# Patient Record
Sex: Female | Born: 1955
Health system: Southern US, Community
[De-identification: ages and names within clinical notes are randomized; demographics above are authoritative.]

## PROBLEM LIST (undated history)

## (undated) DIAGNOSIS — I10 Essential (primary) hypertension: Secondary | ICD-10-CM

## (undated) DIAGNOSIS — L900 Lichen sclerosus et atrophicus: Secondary | ICD-10-CM

## (undated) DIAGNOSIS — R87619 Unspecified abnormal cytological findings in specimens from cervix uteri: Secondary | ICD-10-CM

## (undated) DIAGNOSIS — F32A Depression, unspecified: Secondary | ICD-10-CM

## (undated) DIAGNOSIS — F419 Anxiety disorder, unspecified: Secondary | ICD-10-CM

## (undated) DIAGNOSIS — Z860100 Personal history of colon polyps, unspecified: Secondary | ICD-10-CM

## (undated) DIAGNOSIS — E119 Type 2 diabetes mellitus without complications: Secondary | ICD-10-CM

## (undated) DIAGNOSIS — E785 Hyperlipidemia, unspecified: Secondary | ICD-10-CM

## (undated) DIAGNOSIS — E079 Disorder of thyroid, unspecified: Secondary | ICD-10-CM

## (undated) DIAGNOSIS — IMO0002 Reserved for concepts with insufficient information to code with codable children: Secondary | ICD-10-CM

## (undated) DIAGNOSIS — F329 Major depressive disorder, single episode, unspecified: Secondary | ICD-10-CM

## (undated) HISTORY — DX: Lichen sclerosus et atrophicus: L90.0

## (undated) HISTORY — DX: Essential (primary) hypertension: I10

## (undated) HISTORY — DX: Depression, unspecified: F32.A

## (undated) HISTORY — DX: Unspecified abnormal cytological findings in specimens from cervix uteri: R87.619

## (undated) HISTORY — DX: Personal history of colon polyps, unspecified: Z86.0100

## (undated) HISTORY — DX: Personal history of colonic polyps: Z86.010

## (undated) HISTORY — DX: Type 2 diabetes mellitus without complications: E11.9

## (undated) HISTORY — PX: COLPOSCOPY: SHX161

## (undated) HISTORY — DX: Anxiety disorder, unspecified: F41.9

## (undated) HISTORY — PX: LYMPH NODE BIOPSY: SHX201

## (undated) HISTORY — DX: Hyperlipidemia, unspecified: E78.5

## (undated) HISTORY — DX: Major depressive disorder, single episode, unspecified: F32.9

## (undated) HISTORY — DX: Reserved for concepts with insufficient information to code with codable children: IMO0002

## (undated) HISTORY — DX: Disorder of thyroid, unspecified: E07.9

---

## 1988-02-21 HISTORY — PX: TUBAL LIGATION: SHX77

## 2008-02-21 DIAGNOSIS — R87619 Unspecified abnormal cytological findings in specimens from cervix uteri: Secondary | ICD-10-CM

## 2008-02-21 HISTORY — DX: Unspecified abnormal cytological findings in specimens from cervix uteri: R87.619

## 2011-10-10 ENCOUNTER — Other Ambulatory Visit (HOSPITAL_COMMUNITY)
Admission: RE | Admit: 2011-10-10 | Discharge: 2011-10-10 | Disposition: A | Discharge status: Home / Self Care | Payer: 59 | Source: Ambulatory Visit | Attending: Family Medicine | Admitting: Family Medicine

## 2011-10-10 DIAGNOSIS — Z01419 Encounter for gynecological examination (general) (routine) without abnormal findings: Secondary | ICD-10-CM | POA: Insufficient documentation

## 2011-10-10 DIAGNOSIS — Z1151 Encounter for screening for human papillomavirus (HPV): Secondary | ICD-10-CM | POA: Insufficient documentation

## 2012-10-29 ENCOUNTER — Ambulatory Visit
Admission: RE | Admit: 2012-10-29 | Discharge: 2012-10-29 | Disposition: A | Discharge status: Home / Self Care | Payer: 59 | Source: Ambulatory Visit | Attending: Orthopedic Surgery | Admitting: Orthopedic Surgery

## 2012-10-29 ENCOUNTER — Other Ambulatory Visit: Payer: Self-pay | Admitting: Orthopedic Surgery

## 2012-10-29 DIAGNOSIS — M25611 Stiffness of right shoulder, not elsewhere classified: Secondary | ICD-10-CM

## 2012-10-29 DIAGNOSIS — M25511 Pain in right shoulder: Secondary | ICD-10-CM

## 2012-11-12 ENCOUNTER — Other Ambulatory Visit: Payer: Self-pay | Admitting: Orthopedic Surgery

## 2012-11-12 DIAGNOSIS — M25511 Pain in right shoulder: Secondary | ICD-10-CM

## 2012-11-23 ENCOUNTER — Other Ambulatory Visit: Payer: 59

## 2012-11-25 ENCOUNTER — Ambulatory Visit: Payer: 59

## 2012-11-25 ENCOUNTER — Ambulatory Visit (INDEPENDENT_AMBULATORY_CARE_PROVIDER_SITE_OTHER): Payer: 59 | Admitting: Family Medicine

## 2012-11-25 VITALS — BP 150/82 | HR 83 | Temp 98.0°F | Resp 16 | Ht 63.0 in | Wt 145.0 lb

## 2012-11-25 DIAGNOSIS — M25512 Pain in left shoulder: Secondary | ICD-10-CM

## 2012-11-25 DIAGNOSIS — M25519 Pain in unspecified shoulder: Secondary | ICD-10-CM

## 2012-11-25 MED ORDER — METHOCARBAMOL 500 MG PO TABS: 500.0000 mg | ORAL_TABLET | Freq: Three times a day (TID) | ORAL | Status: DC

## 2012-11-25 NOTE — Patient Instructions (Signed)
Use the robaxin as directed.  Be cautions of sedation with this medication  Please call your orthopedist about your MRI and see about having this rescheduled.    Avoid lifting anything overhead.    I will get you referred to see PT.

## 2012-11-25 NOTE — Progress Notes (Signed)
[facility] [address], [place] [address] [phone]  Date:  11/25/2012   Name:  Nancy Chandler   DOB:  06-Oct-1955   MRN:  [id number]  PCP:  Geraldo Pitter, MD    Chief Complaint: Shoulder Pain   History of Present Illness:  Nancy Chandler is a 57 y.o. very pleasant female patient who presents with the following:  She has noted pain in her left shoulder, down her left arm and into her left shoulder blade.  "It's just throbbing and has been for a couple of days."  Today is Monday and she has had pain since last Wednesday.   She also has noted pain in her right shoulder for a couple of months.  She has seen ortho (?whom) and been dx with possible torn rotator cuff.  She was supposed to have an MRI this past Friday.  However, she never did end up having the MRI for insurance reasons.  She has been protecting the right sholder by and this may have caused her to overuse her left shoulder.    She notes pain at night especially.  "I can't get comfortable.  This pain is making me crazy."  She is otherwise generally healthy.  History of mild HTN.    No personal history of CAD.  Her grandmother did have CHF, but otherwise no family history of cardiac disease.  She has never had any sort of cardiac evaluation, no history of exertional pain.  Never a smoker There are no active problems to display for this patient.   Past Medical History  Diagnosis Date  . Hypertension     History reviewed. No pertinent past surgical history.  History  Substance Use Topics  . Smoking status: Never Smoker   . Smokeless tobacco: Not on file  . Alcohol Use: No    Family History  Problem Relation Age of Onset  . Hypertension Brother     No Known Allergies  Medication list has been reviewed and updated.  No current outpatient prescriptions on file prior to visit.   No current facility-administered medications on file prior to visit.    Review of Systems:  As per HPI-  otherwise negative.   Physical Examination: Filed Vitals:   11/25/12 0810  BP: 152/72  Pulse: 83  Temp: 98 F (36.7 C)  Resp: 16   Filed Vitals:   11/25/12 0810  Height: 5\' 3"  (1.6 m)  Weight: 145 lb (65.772 kg)   Body mass index is 25.69 kg/(m^2). Ideal Body Weight: Weight in (lb) to have BMI = 25: 140.8  GEN: WDWN, NAD, Non-toxic, A & O x 3, looks well HEENT: Atraumatic, Normocephalic. Neck supple. No masses, No LAD.  Bilateral TM wnl, oropharynx normal.  PEERL,EOMI.   Ears and Nose: No external deformity. CV: RRR, No M/G/R. No JVD. No thrill. No extra heart sounds. PULM: CTA B, no wheezes, crackles, rhonchi. No retractions. No resp. distress. No accessory muscle use. EXTR: No c/c/e NEURO Normal gait.  PSYCH: Normally interactive. Conversant. Not depressed or anxious appearing.  Calm demeanor.  Right shoulder: very tender over anterior RC insertion.  Not able to abduct past 90.  Pain with internal and external rotation.   Left shoulder; tender over anterior RC insertion.  Mild pain with internal rotation.  Negative Hawkins and neer's.  Full ROM in all directions.  No weakness but she does have pain with Empty Cantest.    EKG:  NSR, no concerning ST elevation or  depression.   [facility] reading (PRIMARY) by  Dr. Patsy Lager. Left shoulder:  Normal.    LEFT SHOULDER - 2+ VIEW  COMPARISON: No priors.  FINDINGS: Multiple views of the left shoulder demonstrate no acute displaced fracture, subluxation, dislocation, or soft tissue abnormality.  IMPRESSION: 1. No acute radiographic abnormality of the left shoulder.   Assessment and Plan: Pain in left shoulder - Plan: DG Shoulder Left, EKG 12-Lead, methocarbamol (ROBAXIN) 500 MG tablet, Ambulatory referral to Physical Therapy  Dula seems to have RC tendonitis vs partial tear in the right shoulder, and less severe RC tendonitis in the left shoulder.  Her EKG and exam are reassuring.  Gave a sling to use only to rest her arm- warned  her not to wear this excessively as stiffness can result.  She is going to call her orthopedist and find out about getting her MRI rescheduled.   PT referral   Signed Abbe Amsterdam, MD

## 2012-11-27 ENCOUNTER — Telehealth: Payer: Self-pay

## 2012-11-27 NOTE — Telephone Encounter (Signed)
PT STATES SHE NEED TO COME BY AND P/U A COPY OF HER XRAYS TODAY, HAVE AN APPT PLEASE CALL [phone]

## 2012-11-27 NOTE — Telephone Encounter (Signed)
Have given request to xray

## 2012-11-28 ENCOUNTER — Ambulatory Visit: Payer: 59 | Attending: Orthopedic Surgery | Admitting: Physical Therapy

## 2012-11-28 DIAGNOSIS — M25619 Stiffness of unspecified shoulder, not elsewhere classified: Secondary | ICD-10-CM | POA: Insufficient documentation

## 2012-11-28 DIAGNOSIS — M25519 Pain in unspecified shoulder: Secondary | ICD-10-CM | POA: Insufficient documentation

## 2012-11-28 DIAGNOSIS — IMO0001 Reserved for inherently not codable concepts without codable children: Secondary | ICD-10-CM | POA: Insufficient documentation

## 2012-11-29 ENCOUNTER — Ambulatory Visit
Admission: RE | Admit: 2012-11-29 | Discharge: 2012-11-29 | Disposition: A | Discharge status: Home / Self Care | Payer: 59 | Source: Ambulatory Visit | Attending: Orthopedic Surgery | Admitting: Orthopedic Surgery

## 2012-11-29 ENCOUNTER — Other Ambulatory Visit: Payer: Self-pay | Admitting: Orthopedic Surgery

## 2012-11-29 DIAGNOSIS — M5412 Radiculopathy, cervical region: Secondary | ICD-10-CM

## 2012-12-04 ENCOUNTER — Ambulatory Visit: Payer: 59 | Admitting: Physical Therapy

## 2012-12-11 ENCOUNTER — Ambulatory Visit: Payer: 59 | Admitting: Physical Therapy

## 2012-12-18 ENCOUNTER — Ambulatory Visit: Payer: 59 | Admitting: Physical Therapy

## 2013-01-04 ENCOUNTER — Other Ambulatory Visit: Payer: 59

## 2013-01-27 ENCOUNTER — Ambulatory Visit (INDEPENDENT_AMBULATORY_CARE_PROVIDER_SITE_OTHER): Payer: 59 | Admitting: Family Medicine

## 2013-01-27 VITALS — BP 126/72 | HR 75 | Temp 97.8°F | Resp 18 | Ht 63.0 in | Wt 138.0 lb

## 2013-01-27 DIAGNOSIS — R109 Unspecified abdominal pain: Secondary | ICD-10-CM

## 2013-01-27 DIAGNOSIS — IMO0002 Reserved for concepts with insufficient information to code with codable children: Secondary | ICD-10-CM

## 2013-01-27 DIAGNOSIS — S46911S Strain of unspecified muscle, fascia and tendon at shoulder and upper arm level, right arm, sequela: Secondary | ICD-10-CM

## 2013-01-27 LAB — POCT CBC
Granulocyte percent: 56.4 %G (ref 37–80)
HCT, POC: 44.2 % (ref 37.7–47.9)
Hemoglobin: 13.5 g/dL (ref 12.2–16.2)
Lymph, poc: 2.4 (ref 0.6–3.4)
MCH, POC: 26.4 pg — AB (ref 27–31.2)
MCHC: 30.5 g/dL — AB (ref 31.8–35.4)
MCV: 86.4 fL (ref 80–97)
MID (cbc): 0.4 (ref 0–0.9)
MPV: 9.8 fL (ref 0–99.8)
POC Granulocyte: 3.6 (ref 2–6.9)
POC LYMPH PERCENT: 37.7 %L (ref 10–50)
POC MID %: 5.9 %M (ref 0–12)
Platelet Count, POC: 202 10*3/uL (ref 142–424)
RBC: 5.12 M/uL (ref 4.04–5.48)
RDW, POC: 14.6 %
WBC: 6.4 10*3/uL (ref 4.6–10.2)

## 2013-01-27 LAB — COMPREHENSIVE METABOLIC PANEL
ALT: 14 U/L (ref 0–35)
AST: 16 U/L (ref 0–37)
Albumin: 3.9 g/dL (ref 3.5–5.2)
Alkaline Phosphatase: 71 U/L (ref 39–117)
BUN: 13 mg/dL (ref 6–23)
CO2: 30 mEq/L (ref 19–32)
Calcium: 9.8 mg/dL (ref 8.4–10.5)
Chloride: 104 mEq/L (ref 96–112)
Creat: 0.8 mg/dL (ref 0.50–1.10)
Glucose, Bld: 89 mg/dL (ref 70–99)
Potassium: 3.7 mEq/L (ref 3.5–5.3)
Sodium: 142 mEq/L (ref 135–145)
Total Bilirubin: 0.4 mg/dL (ref 0.3–1.2)
Total Protein: 7.2 g/dL (ref 6.0–8.3)

## 2013-01-27 NOTE — Progress Notes (Signed)
[facility] [address], [place] [address] [phone]  Date:  01/27/2013   Name:  Nancy Chandler   DOB:  10/05/1955   MRN:  [id number]  PCP:  Geraldo Pitter, MD    Chief Complaint: pulled muscle in stomach   History of Present Illness:  Nancy Chandler is a 57 y.o. very pleasant female patient who presents with the following:  Last Thursday (today is Monday) she noted a pain in her stomach that felt like a pulled muscle.  She noted it over the weekend and again today, and called her company nurse who suggested that she be seen.   She first noted it when she woke up last week.  She was also a bit constipated at that time but this is now resolved.   She has not vomited but has felt a little nauseated.  She is eating ok.   No fever, no other symptoms of illness.   She does note that she picked up a large pile of laundry prior to onset of the pain; however she did not have any sudden pain.  No other known injury or fall.  No urinary sx.   She is s/p BTL and is menopausal.    She ate breakfast at 7am, and last drank water about 2 hours ago.    [facility] however it seemed they were having trouble doing the prior auth for her shoulder MRI so she was never able to have this done.  She would like to have a referral for another orthopedist as she is still bothered by shoulder pain and would like to have an MRI at some point.   She has some robaxin at home that she can use for her stomach pain.    There are no active problems to display for this patient.   Past Medical History  Diagnosis Date  . Hypertension     History reviewed. No pertinent past surgical history.  History  Substance Use Topics  . Smoking status: Never Smoker   . Smokeless tobacco: Not on file  . Alcohol Use: No    Family History  Problem Relation Age of Onset  . Hypertension Brother     No Known Allergies  Medication list has  been reviewed and updated.  Current Outpatient Prescriptions on File Prior to Visit  Medication Sig Dispense Refill  . hydrochlorothiazide (MICROZIDE) 12.5 MG capsule Take 12.5 mg by mouth daily.      . potassium chloride (K-DUR,KLOR-CON) 10 MEQ tablet Take 10 mEq by mouth 2 (two) times daily.      . methocarbamol (ROBAXIN) 500 MG tablet Take 1 tablet (500 mg total) by mouth 3 (three) times daily.  30 tablet  0  . naproxen (NAPROSYN) 500 MG tablet Take 500 mg by mouth 2 (two) times daily with a meal.       No current facility-administered medications on file prior to visit.    Review of Systems:  As per HPI- otherwise negative.   Physical Examination: Filed Vitals:   01/27/13 1346  BP: 126/72  Pulse: 75  Temp: 97.8 F (36.6 C)  Resp: 18   Filed Vitals:   01/27/13 1346  Height: 5\' 3"  (1.6 m)  Weight: 138 lb (62.596 kg)   Body mass index is 24.45 kg/(m^2). Ideal Body Weight: Weight in (lb) to have BMI = 25: 140.8  GEN: WDWN, NAD, Non-toxic, A & O x 3, looks  well HEENT: Atraumatic, Normocephalic. Neck supple. No masses, No LAD.  Bilateral TM wnl, oropharynx normal.  PEERL,EOMI.   Ears and Nose: No external deformity. CV: RRR, No M/G/R. No JVD. No thrill. No extra heart sounds. PULM: CTA B, no wheezes, crackles, rhonchi. No retractions. No resp. distress. No accessory muscle use. ABD: S, ND, +BS. No rebound. No HSM.  She indicates an area of tenderness on the right side of her umbilicus.  No definite muscular deficit felt.  Possibly enlarged liver.  Negative murphys, no RLQ tenderness  EXTR: No c/c/e NEURO Normal gait.  PSYCH: Normally interactive. Conversant. Not depressed or anxious appearing.  Calm demeanor.   Results for orders placed in visit on 01/27/13  POCT CBC      Result Value Range   WBC 6.4  4.6 - 10.2 K/uL   Lymph, poc 2.4  0.6 - 3.4   POC LYMPH PERCENT 37.7  10 - 50 %L   MID (cbc) 0.4  0 - 0.9   POC MID % 5.9  0 - 12 %M   POC Granulocyte 3.6  2 - 6.9    Granulocyte percent 56.4  37 - 80 %G   RBC 5.12  4.04 - 5.48 M/uL   Hemoglobin 13.5  12.2 - 16.2 g/dL   HCT, POC 96.0  45.4 - 47.9 %   MCV 86.4  80 - 97 fL   MCH, POC 26.4 (*) 27 - 31.2 pg   MCHC 30.5 (*) 31.8 - 35.4 g/dL   RDW, POC 09.8     Platelet Count, POC 202  142 - 424 K/uL   MPV 9.8  0 - 99.8 fL    Assessment and Plan: Abdominal  pain, other specified site - Plan: POCT CBC, Comprehensive metabolic panel, US Abdomen Complete  Right shoulder strain, sequela - Plan: Ambulatory referral to Orthopedic Surgery  Unable to get abd [place] today as she had water earlier today, made appt for tomorrow. Await CMP.  CBC is reassuring that she does not have any acute infective process.   Await [place] results tomorrow.   Referral to another ortho office for care of her shoulder.    Signed Abbe Amsterdam, MD

## 2013-01-27 NOTE — Patient Instructions (Signed)
Please go to [facility] at [employer] tomorrow by 9:15 am to have your ultrasound.  NOTHING to eat or drink after midnight tonight.  I will let you know what you report shows once it comes in.    Try some of the robaxin for your abdominal pains.   If you get much worse- fever, vomiting, or more severe pain in the meantime- go to the ER or call me

## 2013-01-28 ENCOUNTER — Ambulatory Visit
Admission: RE | Admit: 2013-01-28 | Discharge: 2013-01-28 | Disposition: A | Discharge status: Home / Self Care | Payer: 59 | Source: Ambulatory Visit | Attending: Family Medicine | Admitting: Family Medicine

## 2013-01-28 ENCOUNTER — Telehealth: Payer: Self-pay

## 2013-01-28 DIAGNOSIS — R109 Unspecified abdominal pain: Secondary | ICD-10-CM

## 2013-01-28 NOTE — Telephone Encounter (Signed)
Patient is calling to find out test results.  [phone]

## 2013-01-28 NOTE — Telephone Encounter (Signed)
Called her back- good news, her ultrasound and CMP are normal.  She is feeling a bit better, notes the pain less today. Asked her to keep me posted- if pain persists or gets worse we will proceed to a CT scan.  She agreed with plan.

## 2013-02-03 ENCOUNTER — Telehealth: Payer: Self-pay

## 2013-02-03 DIAGNOSIS — R109 Unspecified abdominal pain: Secondary | ICD-10-CM

## 2013-02-03 NOTE — Telephone Encounter (Signed)
PT WANTED DR COPLAND TO KNOW SHE IS STILL HAVING THE SAME PROBLEMS AS BEFORE. PLEASE CALL [phone]

## 2013-02-03 NOTE — Telephone Encounter (Signed)
Dr Patsy Lager has advised if pain persists proceed with CT scan. Advised her order put in for this.  Patient states she has also had darker stools recently, she is taking pepto bismol. Advised often this is the cause, if it continues, she will come in. She indicated abdominal pain not worsening, just not improving at all. She agrees to come in immediately if pain gets worse.

## 2013-02-03 NOTE — Telephone Encounter (Signed)
Called her back- she is not any worse but her sx persist.  No fever, no vomiting, she is still eating ok.  She will come and see me tomorrow and we will plan for a CT scan tomorrow afternoon

## 2013-02-04 ENCOUNTER — Ambulatory Visit: Payer: 59

## 2013-02-04 ENCOUNTER — Encounter: Payer: Self-pay | Admitting: Family Medicine

## 2013-02-04 ENCOUNTER — Ambulatory Visit
Admission: RE | Admit: 2013-02-04 | Discharge: 2013-02-04 | Disposition: A | Discharge status: Home / Self Care | Payer: 59 | Source: Ambulatory Visit | Attending: Family Medicine | Admitting: Family Medicine

## 2013-02-04 ENCOUNTER — Ambulatory Visit (INDEPENDENT_AMBULATORY_CARE_PROVIDER_SITE_OTHER): Payer: 59 | Admitting: Family Medicine

## 2013-02-04 VITALS — BP 138/80 | HR 72 | Temp 97.9°F | Resp 16 | Ht 63.25 in | Wt 139.2 lb

## 2013-02-04 DIAGNOSIS — R109 Unspecified abdominal pain: Secondary | ICD-10-CM

## 2013-02-04 DIAGNOSIS — R3129 Other microscopic hematuria: Secondary | ICD-10-CM

## 2013-02-04 LAB — POCT URINALYSIS DIPSTICK
Bilirubin, UA: NEGATIVE
Glucose, UA: NEGATIVE
Ketones, UA: NEGATIVE
Leukocytes, UA: NEGATIVE
Nitrite, UA: NEGATIVE
Protein, UA: NEGATIVE
Spec Grav, UA: <=1.005
Urobilinogen, UA: 0.2
pH, UA: 6

## 2013-02-04 LAB — POCT UA - MICROSCOPIC ONLY
Bacteria, U Microscopic: NEGATIVE
Casts, Ur, LPF, POC: NEGATIVE
Crystals, Ur, HPF, POC: NEGATIVE
Epithelial cells, urine per micros: NEGATIVE
Mucus, UA: NEGATIVE
WBC, Ur, HPF, POC: NEGATIVE
Yeast, UA: NEGATIVE

## 2013-02-04 LAB — POCT URINE PREGNANCY: Preg Test, Ur: NEGATIVE

## 2013-02-04 LAB — IFOBT (OCCULT BLOOD): IFOBT: NEGATIVE

## 2013-02-04 MED ORDER — IOHEXOL 300 MG/ML  SOLN
30.0000 mL | Freq: Once | INTRAMUSCULAR | Status: AC | PRN
  Administered 2013-02-04: 30 mL via ORAL

## 2013-02-04 MED ORDER — IOHEXOL 300 MG/ML  SOLN
100.0000 mL | Freq: Once | INTRAMUSCULAR | Status: AC | PRN
  Administered 2013-02-04: 100 mL via INTRAVENOUS

## 2013-02-04 NOTE — Progress Notes (Addendum)
[facility] [address] [place] [address] [phone]  Date:  02/04/2013   Name:  Nancy Chandler   DOB:  Oct 14, 1955   MRN:  [id number]  PCP:  Geraldo Pitter, MD    Chief Complaint: Follow-up   History of Present Illness:  Nancy Chandler is a 57 y.o. very pleasant female patient who presents with the following:  She was seen here on 12/8 with pain to the right of her umbilicus for about 5 days. An ultrasound was unremarkable.  Since her last visit her pain has come and gone. She continues to hurt just to the right of her belly button.  She still has no urinary sx.   However, she did notice a couple of dark appearing stools.  She had taken some pepto a couple of times and was not sure if this could be the cause Her last colonosocpy was about 1 year ago.  Her mother died of colon cancer so this makes her nervous.    She has coughed a little over the last couple of days.   No fever.   She is eating normaly  No dramatic weight changes.  Overall she feels ok  Wt Readings from Last 3 Encounters:  02/04/13 139 lb 3.2 oz (63.141 kg)  01/27/13 138 lb (62.596 kg)  11/25/12 145 lb (65.772 kg)    There are no active problems to display for this patient.   Past Medical History  Diagnosis Date  . Hypertension     No past surgical history on file.  History  Substance Use Topics  . Smoking status: Never Smoker   . Smokeless tobacco: Not on file  . Alcohol Use: No    Family History  Problem Relation Age of Onset  . Hypertension Brother     Allergies  Allergen Reactions  . Hydrocodone Nausea And Vomiting    Medication list has been reviewed and updated.  Current Outpatient Prescriptions on File Prior to Visit  Medication Sig Dispense Refill  . ATORVASTATIN CALCIUM PO Take by mouth.      . hydrochlorothiazide (MICROZIDE) 12.5 MG capsule Take 12.5 mg by mouth daily.      . potassium chloride (K-DUR,KLOR-CON) 10 MEQ tablet Take 10 mEq by mouth 2 (two)  times daily.      . Vitamin D, Cholecalciferol, 1000 UNITS TABS Take by mouth.      . methocarbamol (ROBAXIN) 500 MG tablet Take 1 tablet (500 mg total) by mouth 3 (three) times daily.  30 tablet  0  . naproxen (NAPROSYN) 500 MG tablet Take 500 mg by mouth 2 (two) times daily with a meal.       No current facility-administered medications on file prior to visit.    Review of Systems:  As per HPI- otherwise negative.   Physical Examination: Filed Vitals:   02/04/13 1249  BP: 138/80  Pulse: 72  Temp: 97.9 F (36.6 C)  Resp: 16   Filed Vitals:   02/04/13 1249  Height: 5' 3.25" (1.607 m)  Weight: 139 lb 3.2 oz (63.141 kg)   Body mass index is 24.45 kg/(m^2). Ideal Body Weight: Weight in (lb) to have BMI = 25: 142  GEN: WDWN, NAD, Non-toxic, A & O x 3 HEENT: Atraumatic, Normocephalic. Neck supple. No masses, No LAD. Ears and Nose: No external deformity. CV: RRR, No M/G/R. No JVD. No thrill. No extra heart sounds. PULM: CTA B, no wheezes, crackles, rhonchi. No retractions. No resp. distress. No accessory  muscle use. ABD: S,  ND, +BS. No rebound. No HSM.  Continues to have minimal abdominal tenderness just right and superior to the umbilicus.  EXTR: No c/c/e NEURO Normal gait.  PSYCH: Normally interactive. Conversant. Not depressed or anxious appearing.  Calm demeanor.  Rectal: wnl, no BRB or melena evident  Results for orders placed in visit on 02/04/13  POCT URINE PREGNANCY      Result Value Range   Preg Test, Ur Negative    IFOBT (OCCULT BLOOD)      Result Value Range   IFOBT Negative    POCT UA - MICROSCOPIC ONLY      Result Value Range   WBC, Ur, HPF, POC neg     RBC, urine, microscopic 0-3     Bacteria, U Microscopic neg     Mucus, UA neg     Epithelial cells, urine per micros neg     Crystals, Ur, HPF, POC neg     Casts, Ur, LPF, POC neg     Yeast, UA neg    POCT URINALYSIS DIPSTICK      Result Value Range   Color, UA lt yellow     Clarity, UA clear      Glucose, UA neg     Bilirubin, UA neg     Ketones, UA neg     Spec Grav, UA <=1.005     Blood, UA trace-lysed     pH, UA 6.0     Protein, UA neg     Urobilinogen, UA 0.2     Nitrite, UA neg     Leukocytes, UA Negative       [facility] reading (PRIMARY) by  Dr. Patsy Lager. CXR:  Negative  CHEST 2 VIEW  COMPARISON: None.  FINDINGS: Cardiomediastinal silhouette is unremarkable. No acute infiltrate or pleural effusion. No pulmonary edema. Bilateral nipple shadow is noted.  IMPRESSION: No active cardiopulmonary disease.  Assessment and Plan: Abdominal  pain, other specified site - Plan: POCT urine pregnancy, IFOBT POC (occult bld, rslt in office), DG Chest 2 View, POCT UA - Microscopic Only, POCT urinalysis dipstick, Urine culture  Persistent abdominal pain.  Nancy Chandler is concerned and would like to go for a CT today. Will arrange this for her.   Signed Abbe Amsterdam, MD  Sent for CT scan COMPARISON: None.  FINDINGS: The lung bases are clear. No pleural or pericardial effusion identified.  Tiny low attenuation structure in the dome of liver is noted measuring less than 1 cm. This is too small to characterize. The gallbladder is normal. No biliary dilatation. Normal appearance of the pancreas. The spleen is unremarkable.  The adrenal glands are on unremarkable. Normal appearance of the kidneys. The urinary bladder appears normal. The uterus and adnexal structures are unremarkable.  There is calcified atherosclerotic disease affecting the abdominal aorta. There is no aneurysm. No upper abdominal adenopathy. There is no pelvic or inguinal adenopathy.  The stomach is normal. The small bowel loops are on unremarkable. The appendix is visualized and appears normal. Normal appearance of the colon.  There is no free fluid or abnormal fluid collections identified within the abdomen or pelvis.  Review of the visualized osseous structures is unremarkable.  IMPRESSION: 1. No  acute findings. 2. No explanation for patient's pain.  Called and discussed CT with her.  She is relieved that it looks ok.  At this time we plan to monitor her condition.  She will let me know if it does not remit soon, Sooner if  worse.    12/23: she is about the same.  Her pain is not worse. She has no GI symptoms Discussed her urine culture and minimal microhematuria.  Per AUA guidelines we should fully evaluate anyone with 3 or more RBCs per HPF.  However she has 0-3 cells.  Never been a smoker.  She would like to repeat a urine and then make a decision about referral which is reasonable

## 2013-02-04 NOTE — Patient Instructions (Signed)
I will be in touch with your CT results today.

## 2013-02-06 LAB — URINE CULTURE
Colony Count: NO GROWTH
Organism ID, Bacteria: NO GROWTH

## 2013-02-11 NOTE — Addendum Note (Signed)
Addended by: Abbe Amsterdam C on: 02/11/2013 05:18 PM   Modules accepted: Orders

## 2013-02-17 ENCOUNTER — Other Ambulatory Visit (INDEPENDENT_AMBULATORY_CARE_PROVIDER_SITE_OTHER): Payer: 59 | Admitting: Family Medicine

## 2013-02-17 DIAGNOSIS — R3129 Other microscopic hematuria: Secondary | ICD-10-CM

## 2013-02-17 LAB — POCT UA - MICROSCOPIC ONLY
Casts, Ur, LPF, POC: NEGATIVE
Crystals, Ur, HPF, POC: NEGATIVE
Mucus, UA: POSITIVE
Yeast, UA: NEGATIVE

## 2013-02-17 LAB — POCT URINALYSIS DIPSTICK
Bilirubin, UA: NEGATIVE
Glucose, UA: NEGATIVE
Ketones, UA: NEGATIVE
Leukocytes, UA: NEGATIVE
Nitrite, UA: NEGATIVE
Protein, UA: NEGATIVE
Spec Grav, UA: <=1.005
Urobilinogen, UA: 0.2
pH, UA: 6

## 2013-02-17 NOTE — Progress Notes (Signed)
Lab work only

## 2013-02-21 ENCOUNTER — Telehealth: Payer: Self-pay | Admitting: Radiology

## 2013-02-21 DIAGNOSIS — R3129 Other microscopic hematuria: Secondary | ICD-10-CM

## 2013-02-21 NOTE — Telephone Encounter (Signed)
Called to go over her labs.  Unfortunately she still has microhematuria and in fact now has more cells. Will refer to urology for eval; she may need cystoscopy as she has already had a CT.  She is in agreement with this plan

## 2013-02-21 NOTE — Telephone Encounter (Signed)
Patient wants U/A results indicates she feels better.

## 2013-04-17 ENCOUNTER — Encounter: Payer: 59 | Admitting: Certified Nurse Midwife

## 2013-05-12 ENCOUNTER — Encounter: Payer: Self-pay | Admitting: Certified Nurse Midwife

## 2013-05-12 ENCOUNTER — Ambulatory Visit (INDEPENDENT_AMBULATORY_CARE_PROVIDER_SITE_OTHER): Payer: 59 | Admitting: Certified Nurse Midwife

## 2013-05-12 VITALS — BP 122/68 | HR 82 | Resp 16 | Ht 63.25 in | Wt 136.0 lb

## 2013-05-12 DIAGNOSIS — N904 Leukoplakia of vulva: Secondary | ICD-10-CM

## 2013-05-12 DIAGNOSIS — I10 Essential (primary) hypertension: Secondary | ICD-10-CM

## 2013-05-12 DIAGNOSIS — Z0000 Encounter for general adult medical examination without abnormal findings: Secondary | ICD-10-CM

## 2013-05-12 DIAGNOSIS — N952 Postmenopausal atrophic vaginitis: Secondary | ICD-10-CM

## 2013-05-12 DIAGNOSIS — Z01419 Encounter for gynecological examination (general) (routine) without abnormal findings: Secondary | ICD-10-CM

## 2013-05-12 DIAGNOSIS — K5900 Constipation, unspecified: Secondary | ICD-10-CM

## 2013-05-12 DIAGNOSIS — L940 Localized scleroderma [morphea]: Secondary | ICD-10-CM

## 2013-05-12 MED ORDER — CLOBETASOL PROPIONATE 0.05 % EX OINT: 1.0000 "application " | TOPICAL_OINTMENT | Freq: Two times a day (BID) | CUTANEOUS | Status: DC

## 2013-05-12 NOTE — Patient Instructions (Signed)
EXERCISE AND DIET:  We recommended that you start or continue a regular exercise program for good health. Regular exercise means any activity that makes your heart beat faster and makes you sweat.  We recommend exercising at least 30 minutes per day at least 3 days a week, preferably 4 or 5.  We also recommend a diet low in fat and sugar.  Inactivity, poor dietary choices and obesity can cause diabetes, heart attack, stroke, and kidney damage, among others.    ALCOHOL AND SMOKING:  Women should limit their alcohol intake to no more than 7 drinks/beers/glasses of wine (combined, not each!) per week. Moderation of alcohol intake to this level decreases your risk of breast cancer and liver damage. And of course, no recreational drugs are part of a healthy lifestyle.  And absolutely no smoking or even second hand smoke. Most people know smoking can cause heart and lung diseases, but did you know it also contributes to weakening of your bones? Aging of your skin?  Yellowing of your teeth and nails?  CALCIUM AND VITAMIN D:  Adequate intake of calcium and Vitamin D are recommended.  The recommendations for exact amounts of these supplements seem to change often, but generally speaking 600 mg of calcium (either carbonate or citrate) and 800 units of Vitamin D per day seems prudent. Certain women may benefit from higher intake of Vitamin D.  If you are among these women, your doctor will have told you during your visit.    PAP SMEARS:  Pap smears, to check for cervical cancer or precancers,  have traditionally been done yearly, although recent scientific advances have shown that most women can have pap smears less often.  However, every woman still should have a physical exam from her gynecologist every year. It will include a breast check, inspection of the vulva and vagina to check for abnormal growths or skin changes, a visual exam of the cervix, and then an exam to evaluate the size and shape of the uterus and  ovaries.  And after 59 years of age, a rectal exam is indicated to check for rectal cancers. We will also provide age appropriate advice regarding health maintenance, like when you should have certain vaccines, screening for sexually transmitted diseases, bone density testing, colonoscopy, mammograms, etc.   MAMMOGRAMS:  All women over 40 years old should have a yearly mammogram. Many facilities now offer a "3D" mammogram, which may cost around $50 extra out of pocket. If possible,  we recommend you accept the option to have the 3D mammogram performed.  It both reduces the number of women who will be called back for extra views which then turn out to be normal, and it is better than the routine mammogram at detecting truly abnormal areas.    COLONOSCOPY:  Colonoscopy to screen for colon cancer is recommended for all women at age 50.  We know, you hate the idea of the prep.  We agree, BUT, having colon cancer and not knowing it is worse!!  Colon cancer so often starts as a polyp that can be seen and removed at colonscopy, which can quite literally save your life!  And if your first colonoscopy is normal and you have no family history of colon cancer, most women don't have to have it again for 10 years.  Once every ten years, you can do something that may end up saving your life, right?  We will be happy to help you get it scheduled when you are ready.    Be sure to check your insurance coverage so you understand how much it will cost.  It may be covered as a preventative service at no cost, but you should check your particular policy.     High-Fiber Diet Fiber is found in fruits, vegetables, and grains. A high-fiber diet encourages the addition of more whole grains, legumes, fruits, and vegetables in your diet. The recommended amount of fiber for adult males is 38 g per day. For adult females, it is 25 g per day. Pregnant and lactating women should get 28 g of fiber per day. If you have a digestive or bowel  problem, ask your caregiver for advice before adding high-fiber foods to your diet. Eat a variety of high-fiber foods instead of only a select few type of foods.  PURPOSE  To increase stool bulk.  To make bowel movements more regular to prevent constipation.  To lower cholesterol.  To prevent overeating. WHEN IS THIS DIET USED?  It may be used if you have constipation and hemorrhoids.  It may be used if you have uncomplicated diverticulosis (intestine condition) and irritable bowel syndrome.  It may be used if you need help with weight management.  It may be used if you want to add it to your diet as a protective measure against atherosclerosis, diabetes, and cancer. SOURCES OF FIBER  Whole-grain breads and cereals.  Fruits, such as apples, oranges, bananas, berries, prunes, and pears.  Vegetables, such as green peas, carrots, sweet potatoes, beets, broccoli, cabbage, spinach, and artichokes.  Legumes, such split peas, soy, lentils.  Almonds. FIBER CONTENT IN FOODS Starches and Grains / Dietary Fiber (g)  Cheerios, 1 cup / 3 g  Corn Flakes cereal, 1 cup / 0.7 g  Rice crispy treat cereal, 1 cup / 0.3 g  Instant oatmeal (cooked),  cup / 2 g  Frosted wheat cereal, 1 cup / 5.1 g  Brown, long-grain rice (cooked), 1 cup / 3.5 g  White, long-grain rice (cooked), 1 cup / 0.6 g  Enriched macaroni (cooked), 1 cup / 2.5 g Legumes / Dietary Fiber (g)  Baked beans (canned, plain, or vegetarian),  cup / 5.2 g  Kidney beans (canned),  cup / 6.8 g  Pinto beans (cooked),  cup / 5.5 g Breads and Crackers / Dietary Fiber (g)  Plain or honey Saulters crackers, 2 squares / 0.7 g  Saltine crackers, 3 squares / 0.3 g  Plain, salted pretzels, 10 pieces / 1.8 g  Whole-wheat bread, 1 slice / 1.9 g  White bread, 1 slice / 0.7 g  Raisin bread, 1 slice / 1.2 g  Plain bagel, 3 oz / 2 g  Flour tortilla, 1 oz / 0.9 g  Corn tortilla, 1 small / 1.5 g  Hamburger or hotdog  bun, 1 small / 0.9 g Fruits / Dietary Fiber (g)  Apple with skin, 1 medium / 4.4 g  Sweetened applesauce,  cup / 1.5 g  Banana,  medium / 1.5 g  Grapes, 10 grapes / 0.4 g  Orange, 1 small / 2.3 g  Raisin, 1.5 oz / 1.6 g  Melon, 1 cup / 1.4 g Vegetables / Dietary Fiber (g)  Green beans (canned),  cup / 1.3 g  Carrots (cooked),  cup / 2.3 g  Broccoli (cooked),  cup / 2.8 g  Peas (cooked),  cup / 4.4 g  Mashed potatoes,  cup / 1.6 g  Lettuce, 1 cup / 0.5 g  Corn (canned),  cup / 1.6  g  Tomato,  cup / 1.1 g Document Released: 02/06/2005 Document Revised: 08/08/2011 Document Reviewed: 05/11/2011 [facility] Patient Information 2014 [place].   It was very nice to meet you today. Remember to have your Vitamin D checked with PCP, and check on tetanus date . Have a great spring! Debbi

## 2013-05-12 NOTE — Progress Notes (Signed)
58 y.o. G2P2002 Married African American Fe here to establish gyn care and for annual exam. Menopausal no HRT. Denies vaginal bleeding or spotting. Patient is experiencing vaginal dryness and some discomfort with intercourse. Patient uses OTC lubricant, with some relief.Patient has recently married in the past 2 years, and was not sexually active prior for a long time. Patient sees PCP for hypertension and cholesterol management with no medication change. PCP does all lab work, aex, meds. Patient complaining of constipation 2- 3 times weekly, eats some fiber and drinks water 2 bottles daily, with coffee, tea also. Patient does not do any regular and exercise and knows "that would help"  No other health issues today.    Patient's last menstrual period was 02/21/2008.          Sexually active: yes  The current method of family planning is tubal ligation.    Exercising: no  exercise Smoker:  no  Health Maintenance: Pap:  2012 neg per patient MMG:  2/15 normal per patient Colonoscopy: 2014 normal per patient BMD:   2012 TDaP: unsure per patient will check with PCP Labs: done with pcp Self breast exam: done monthly   reports that she has never smoked. She does not have any smokeless tobacco history on file. She reports that she does not drink alcohol or use illicit drugs.  Past Medical History  Diagnosis Date  . Hypertension   . Abnormal Pap smear of cervix 2010    HPV  . Anxiety   . Depression   . Dyspareunia     Past Surgical History  Procedure Laterality Date  . Colposcopy  2010  . Tubal ligation  1990    Current Outpatient Prescriptions  Medication Sig Dispense Refill  . ATORVASTATIN CALCIUM PO Take 40 mg by mouth daily.       . hydrochlorothiazide (MICROZIDE) 12.5 MG capsule Take 12.5 mg by mouth daily.      Marland Kitchen UNABLE TO FIND daily. Vitamin d3-k2       No current facility-administered medications for this visit.    Family History  Problem Relation Age of Onset  .  Hypertension Brother   . Cancer Mother     colon  . Hypertension Father   . Heart disease Maternal Grandmother     CHF  . Cancer Maternal Grandfather     kidney  . Heart disease Paternal Grandmother     CHF  . Breast cancer Paternal Aunt   . Heart disease Paternal Aunt     open heart surgery    ROS:  Pertinent items are noted in HPI.  Otherwise, a comprehensive ROS was negative.  Exam:   BP 122/68  Pulse 82  Resp 16  Ht 5' 3.25" (1.607 m)  Wt 136 lb (61.689 kg)  BMI 23.89 kg/m2  LMP 02/21/2008 Height: 5' 3.25" (160.7 cm)  Ht Readings from Last 3 Encounters:  05/12/13 5' 3.25" (1.607 m)  02/04/13 5' 3.25" (1.607 m)  01/27/13 5\' 3"  (1.6 m)    General appearance: alert, cooperative and appears stated age Head: Normocephalic, without obvious abnormality, atraumatic Neck: no adenopathy, supple, symmetrical, trachea midline and thyroid normal to inspection and palpation and non-palpable Lungs: clear to auscultation bilaterally Breasts: normal appearance, no masses or tenderness, No nipple retraction or dimpling, No nipple discharge or bleeding, No axillary or supraclavicular adenopathy Heart: regular rate and rhythm Abdomen: soft, non-tender; no masses,  no organomegaly Extremities: extremities normal, atraumatic, no cyanosis or edema Skin: Skin color, texture, turgor normal.  No rashes or lesions Lymph nodes: Cervical, supraclavicular, and axillary nodes normal. No abnormal inguinal nodes palpated Neurologic: Grossly normal   Pelvic: External genitalia:  no lesions, lace like pattern noted on vulva with decrease pigmentation noted. (Patient admits to times of intense itching.)              Urethra:  normal appearing urethra with no masses, tenderness or lesions              Bartholin's and Skene's: normal                 Vagina: normal appearing vagina with normal color and discharge, no lesions wet prep taken   Ph 4.5              Cervix: non tender, normal appearance               Pap taken: yes Bimanual Exam:  Uterus:  normal size, contour, position, consistency, mobility, non-tender and anteverted              Adnexa: normal adnexa and no mass, fullness, tenderness               Rectovaginal: Confirms               Anus:  normal sphincter tone, no lesions  Wet prep negative  A:  Well Woman with normal exam  Menopausal no HRT  Vaginal dryness  Lichen scelorous  Constipation  History of Hypertension/elevated cholesterol with PCP management  P:   Reviewed health and wellness pertinent to exam  Discussed importance of notification if vaginal bleeding  Discuss options for treatment of vaginal dryness,estrogen, OTC, Olive oil. Patient would like to try Replens. Instructed to use nightly for 2 weeks and then 2 x weekly. Use Olive oil for sexual activity. Recheck one months  Discussed findings and patient viewed in mirror. Discussed consistent with Lichen scelorous. Etiology discussed and treatment . Questions answered.  Rx Clobetasol see order with instructions. Recheck in one month  Discussed importance of fiber, fluids and exercise to help with constipation. Given handout for patient to use. Discussed daily probiotic also. Questions addressed.  Pap smear as per guidelines   Mammogram yearly pap smear taken today with [facility]  counseled on breast self exam, mammography screening, adequate intake of calcium and vitamin D, diet and exercise  return annually or prn, as above  An After Visit Summary was printed and given to the patient.

## 2013-05-13 NOTE — Progress Notes (Signed)
Agree with plan.  If lesion consistent with lichen sclerosus is not significantly improved in one month, plan vulvar biopsy.  Felipa Emory, MD

## 2013-05-14 LAB — IPS PAP TEST WITH HPV

## 2013-06-09 ENCOUNTER — Ambulatory Visit (INDEPENDENT_AMBULATORY_CARE_PROVIDER_SITE_OTHER): Payer: 59 | Admitting: Certified Nurse Midwife

## 2013-06-09 ENCOUNTER — Encounter: Payer: Self-pay | Admitting: Certified Nurse Midwife

## 2013-06-09 VITALS — BP 120/68 | HR 72 | Resp 16 | Ht 63.25 in | Wt 139.0 lb

## 2013-06-09 DIAGNOSIS — L940 Localized scleroderma [morphea]: Secondary | ICD-10-CM

## 2013-06-09 DIAGNOSIS — N904 Leukoplakia of vulva: Secondary | ICD-10-CM | POA: Insufficient documentation

## 2013-06-09 NOTE — Progress Notes (Signed)
58 y.o. Married [place] American female [phone] here for follow up of Lichen sclerosis treated with Clobetasol initiated on 05/12/13.Using medication as directed.  Denies any problems with medication use. All external itching as subsided. Using Clobetasol twice daily and using Olive oil nightly for vaginal dryness with good results. No other problems today.   O: healthy  WD,WN female Affect: normal,orientation x 3 Skin:warm and dry Pelvic exam:EXTERNAL GENITALIA: Increase pink noted bilateral on vulva, but lace like pattern from LS has resolved except for area  Per diagram on base of vaginal opening to perineum. Lost pigmentation remains per diagram. No scaling or thickness or exudate noted.   VAGINA: no abnormal discharge or lesions and atrophic, but moist. CERVIX: no lesions or cervical motion tenderness  A:Lichen sclerosis responding to Clobetasol Atrophic vaginitis responding well to Brinson.   P: Discussed findings of good response to medication and no new areas noted. Continue Clobetasol one daily now for one month. Notify if any problems with or itching increases. Patient viewed area with mirror, to see change in appearance. Continue Olive Oil daily at hs.   RV one month for recheck, prn

## 2013-06-18 NOTE — Progress Notes (Signed)
Reviewed personally.  M. Suzanne Maytal Mijangos, MD.  

## 2013-07-09 ENCOUNTER — Ambulatory Visit: Payer: 59 | Admitting: Certified Nurse Midwife

## 2013-07-24 ENCOUNTER — Encounter: Payer: Self-pay | Admitting: Certified Nurse Midwife

## 2013-07-24 ENCOUNTER — Ambulatory Visit (INDEPENDENT_AMBULATORY_CARE_PROVIDER_SITE_OTHER): Payer: 59 | Admitting: Certified Nurse Midwife

## 2013-07-24 VITALS — BP 110/70 | HR 70 | Resp 16 | Ht 63.25 in | Wt 139.0 lb

## 2013-07-24 DIAGNOSIS — Z1239 Encounter for other screening for malignant neoplasm of breast: Secondary | ICD-10-CM

## 2013-07-24 DIAGNOSIS — L940 Localized scleroderma [morphea]: Secondary | ICD-10-CM

## 2013-07-24 DIAGNOSIS — L900 Lichen sclerosus et atrophicus: Secondary | ICD-10-CM

## 2013-07-24 NOTE — Progress Notes (Signed)
58 y.o. Married [place] American female [phone] here for follow up of Lichen sclerosus treated with Clobetasol  initiated on 05/12/13.Completed all medication as directed.  Denies any itching now, or new areas noted with mirror. Patient also requests breast exam, she switched to underwire bra and noted a ? Lump after wearing. Stopped usage as can't find it anymore. Patient does SBE and has noted no other changes or nipple discharge.Denies excessive caffeine use also. No other issues today.   O: Healthy WD,WN female Affect: normal Skin:Warm and dry Breasts: Bilateral breast exam, no masses, lesions, skin change or nipple discharge. No mass palpated in area of concern on left breast. No axillary lymph node enlargement. Pelvic exam:EXTERNAL GENITALIA: normal appearing vulva with no masses, tenderness or lesions, with no lace like pattern noted, redness or scaling. Loss pigmentation no change from previous exams. Patient viewed area with mirror also. VAGINA: no abnormal discharge or lesions and normal  A:Lichen Sclerosus resolved at present Normal Breast Exam   P: Discussed findings of no new activity with LS. Discussed now stopping Clobetasol cream. Instructed to restart if itching returns and appearance changes. Discussed etiology of. Questions answered at length. Reassured normal breast exam. Discussed underwire bras can cause compression on the breast and inhibit normal body functions if very tight. Patient plans to avoid! Continue SBE and advise if any change.   Rv prn.   15 minutes spent with patient with >50% of time spent in face to face counseling.

## 2013-08-02 NOTE — Progress Notes (Signed)
Reviewed personally.  M. Suzanne Addysen Louth, MD.  

## 2013-12-08 ENCOUNTER — Encounter: Payer: Self-pay | Admitting: Family Medicine

## 2013-12-08 ENCOUNTER — Ambulatory Visit (INDEPENDENT_AMBULATORY_CARE_PROVIDER_SITE_OTHER): Payer: 59 | Admitting: Family Medicine

## 2013-12-08 VITALS — BP 130/70 | HR 67 | Temp 97.5°F | Resp 16 | Ht 63.5 in | Wt 139.4 lb

## 2013-12-08 DIAGNOSIS — I10 Essential (primary) hypertension: Secondary | ICD-10-CM

## 2013-12-08 DIAGNOSIS — Z8639 Personal history of other endocrine, nutritional and metabolic disease: Secondary | ICD-10-CM

## 2013-12-08 DIAGNOSIS — R10A2 Flank pain, left side: Secondary | ICD-10-CM

## 2013-12-08 DIAGNOSIS — Z130 Encounter for screening for diseases of the blood and blood-forming organs and certain disorders involving the immune mechanism: Secondary | ICD-10-CM

## 2013-12-08 DIAGNOSIS — R109 Unspecified abdominal pain: Secondary | ICD-10-CM

## 2013-12-08 DIAGNOSIS — E785 Hyperlipidemia, unspecified: Secondary | ICD-10-CM | POA: Insufficient documentation

## 2013-12-08 DIAGNOSIS — Z23 Encounter for immunization: Secondary | ICD-10-CM

## 2013-12-08 LAB — COMPREHENSIVE METABOLIC PANEL
ALT: 17 U/L (ref 0–35)
AST: 17 U/L (ref 0–37)
Albumin: 3.9 g/dL (ref 3.5–5.2)
Alkaline Phosphatase: 70 U/L (ref 39–117)
BUN: 13 mg/dL (ref 6–23)
CO2: 31 mEq/L (ref 19–32)
Calcium: 9.2 mg/dL (ref 8.4–10.5)
Chloride: 106 mEq/L (ref 96–112)
Creat: 0.86 mg/dL (ref 0.50–1.10)
Glucose, Bld: 95 mg/dL (ref 70–99)
Potassium: 3.9 mEq/L (ref 3.5–5.3)
Sodium: 142 mEq/L (ref 135–145)
Total Bilirubin: 0.5 mg/dL (ref 0.2–1.2)
Total Protein: 7 g/dL (ref 6.0–8.3)

## 2013-12-08 LAB — POCT URINALYSIS DIPSTICK
Bilirubin, UA: NEGATIVE
Glucose, UA: NEGATIVE
Ketones, UA: NEGATIVE
Leukocytes, UA: NEGATIVE
Nitrite, UA: NEGATIVE
Protein, UA: NEGATIVE
Spec Grav, UA: 1.02
Urobilinogen, UA: 1
pH, UA: 5.5

## 2013-12-08 LAB — POCT UA - MICROSCOPIC ONLY
Casts, Ur, LPF, POC: NEGATIVE
Crystals, Ur, HPF, POC: NEGATIVE
Mucus, UA: POSITIVE
Yeast, UA: NEGATIVE

## 2013-12-08 LAB — CBC
HCT: 43.5 % (ref 36.0–46.0)
Hemoglobin: 14.1 g/dL (ref 12.0–15.0)
MCH: 26.4 pg (ref 26.0–34.0)
MCHC: 32.4 g/dL (ref 30.0–36.0)
MCV: 81.3 fL (ref 78.0–100.0)
Platelets: 219 10*3/uL (ref 150–400)
RBC: 5.35 MIL/uL — ABNORMAL HIGH (ref 3.87–5.11)
RDW: 13.7 % (ref 11.5–15.5)
WBC: 5.6 10*3/uL (ref 4.0–10.5)

## 2013-12-08 LAB — LIPID PANEL
Cholesterol: 174 mg/dL (ref 0–200)
HDL: 66 mg/dL (ref >39)
LDL Cholesterol: 98 mg/dL (ref 0–99)
Total CHOL/HDL Ratio: 2.6 Ratio
Triglycerides: 51 mg/dL (ref <150)
VLDL: 10 mg/dL (ref 0–40)

## 2013-12-08 MED ORDER — ATORVASTATIN CALCIUM 40 MG PO TABS: 40.0000 mg | ORAL_TABLET | Freq: Every day | ORAL | Status: DC

## 2013-12-08 MED ORDER — HYDROCHLOROTHIAZIDE 12.5 MG PO CAPS: 12.5000 mg | ORAL_CAPSULE | Freq: Every day | ORAL | Status: DC

## 2013-12-08 NOTE — Progress Notes (Signed)
[facility] [address] [place] [address] [phone]  Date:  12/08/2013   Name:  Nancy Chandler   DOB:  01-29-1956   MRN:  [id number]  PCP:  No PCP Per Patient    Chief Complaint: Cholesterol check, Medication Refill and right side pain on/off started last wk   History of Present Illness:  Nancy Chandler is a 58 y.o. very pleasant female patient who presents with the following:  Here today to follow-up and do labs and refills.  She is fasting today.   She would like to have her vitamin D checked.   She had a flu shot last week at work Last tetanus as a child.  Would like to update today.   Colonoscopy is UTD  She did see urology last year due to microhematuria, and all turned out ok. She had a cystoscopy which was reassuring.  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  No urinary sx or GI symptoms.  The pain is not severe    Patient Active Problem List   Diagnosis Date Noted  . Hyperlipidemia 12/08/2013  . Lichen sclerosus et atrophicus of the vulva 06/09/2013    Class: Diagnosis of  . Unspecified essential hypertension 05/12/2013    Class: History of    Past Medical History  Diagnosis Date  . Hypertension   . Abnormal Pap smear of cervix 2010    HPV  . Anxiety   . Depression   . Dyspareunia     Past Surgical History  Procedure Laterality Date  . Colposcopy  2010  . Tubal ligation  1990    History  Substance Use Topics  . Smoking status: Never Smoker   . Smokeless tobacco: Never Used  . Alcohol Use: No    Family History  Problem Relation Age of Onset  . Hypertension Brother   . Cancer Mother     colon  . Hypertension Father   . Heart disease Maternal Grandmother     CHF  . Cancer Maternal Grandfather     kidney  . Heart disease Paternal Grandmother     CHF  . Breast cancer Paternal  Aunt   . Heart disease Paternal Aunt     open heart surgery    Allergies  Allergen Reactions  . Hydrocodone Nausea And Vomiting    Medication list has been reviewed and updated.  Current Outpatient Prescriptions on File Prior to Visit  Medication Sig Dispense Refill  . ATORVASTATIN CALCIUM PO Take 40 mg by mouth daily.       . hydrochlorothiazide (MICROZIDE) 12.5 MG capsule Take 12.5 mg by mouth daily.      . clobetasol ointment (TEMOVATE) 1.74 % Apply 1 application topically 2 (two) times daily.  60 g  0   No current facility-administered medications on file prior to visit.    Review of Systems:  As per HPI- otherwise negative.   Physical Examination: Filed Vitals:   12/08/13 0818  BP: 130/70  Pulse: 67  Temp: 97.5 F (36.4 C)  Resp: 16   Filed Vitals:   12/08/13 0818  Height: 5' 3.5" (1.613 m)  Weight: 139 lb 6.4 oz (63.231 kg)   Body mass index is 24.3 kg/(m^2). Ideal Body Weight: Weight in (lb) to have BMI = 25: 143.1  GEN: WDWN, NAD,  Non-toxic, A & O x 3, looks well HEENT: Atraumatic, Normocephalic. Neck supple. No masses, No LAD.  Bilateral TM wnl, oropharynx normal.  PEERL,EOMI.   Ears and Nose: No external deformity. CV: RRR, No M/G/R. No JVD. No thrill. No extra heart sounds. PULM: CTA B, no wheezes, crackles, rhonchi. No retractions. No resp. distress. No accessory muscle use. ABD: S, NT, ND. No rebound. No HSM.  No reproducible left flank pain or abdominal tenderness  EXTR: No c/c/e NEURO Normal gait.  PSYCH: Normally interactive. Conversant. Not depressed or anxious appearing.  Calm demeanor.    Assessment and Plan: Hyperlipidemia - Plan: Lipid panel, atorvastatin (LIPITOR) 40 MG tablet  Essential hypertension - Plan: Comprehensive metabolic panel, hydrochlorothiazide (MICROZIDE) 12.5 MG capsule  Screening for deficiency anemia - Plan: CBC  Left flank pain - Plan: CBC, POCT UA - Microscopic Only, POCT urinalysis dipstick  Immunization due -  Plan: Tdap vaccine greater than or equal to 7yo IM  History of vitamin D deficiency - Plan: Vitamin D, 25-hydroxy  BP controlled, continue current medication.  Await other labs as above.   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  Will check CBC and US/micro, and she will let us know if getting worse or if not better  Signed Lamar Blinks, MD

## 2013-12-08 NOTE — Patient Instructions (Signed)
Your BP looks good today. Continue to work on a healthy diet and exercise.  I will be in touch with the rest of your labs. I do not think that your flank pain likely indicates anything serious but I will be in touch with your labs- let me know if this is persisting or getting worse You got the "tdap" booster today to protect you against tetanus, diptheria and the whooping cough.

## 2013-12-09 ENCOUNTER — Encounter: Payer: Self-pay | Admitting: Family Medicine

## 2013-12-09 LAB — VITAMIN D 25 HYDROXY (VIT D DEFICIENCY, FRACTURES): Vit D, 25-Hydroxy: 34 ng/mL (ref 30–89)

## 2013-12-22 ENCOUNTER — Encounter: Payer: Self-pay | Admitting: Family Medicine

## 2014-02-06 ENCOUNTER — Ambulatory Visit (INDEPENDENT_AMBULATORY_CARE_PROVIDER_SITE_OTHER): Payer: 59 | Admitting: Obstetrics and Gynecology

## 2014-02-06 ENCOUNTER — Encounter: Payer: Self-pay | Admitting: Obstetrics and Gynecology

## 2014-02-06 ENCOUNTER — Telehealth: Payer: Self-pay | Admitting: Certified Nurse Midwife

## 2014-02-06 VITALS — BP 136/76 | Temp 98.6°F | Resp 16 | Ht 63.5 in | Wt 140.6 lb

## 2014-02-06 DIAGNOSIS — R319 Hematuria, unspecified: Secondary | ICD-10-CM

## 2014-02-06 DIAGNOSIS — R1032 Left lower quadrant pain: Secondary | ICD-10-CM

## 2014-02-06 DIAGNOSIS — R102 Pelvic and perineal pain: Secondary | ICD-10-CM

## 2014-02-06 LAB — POCT URINALYSIS DIPSTICK
Leukocytes, UA: NEGATIVE
Urobilinogen, UA: NEGATIVE
pH, UA: 5

## 2014-02-06 MED ORDER — IBUPROFEN 800 MG PO TABS: 800.0000 mg | ORAL_TABLET | Freq: Three times a day (TID) | ORAL | Status: DC | PRN

## 2014-02-06 NOTE — Telephone Encounter (Addendum)
Pt having right sided lower pain. Please call to advise. No chart

## 2014-02-06 NOTE — Telephone Encounter (Signed)
Last AEX 05-12-13 with Debbi. LMP 2010. Hx BTSP. Return call to patient. Reports RLQ pain since Wednesday that is increasing. Radiates to back  Denies nausea, vomiting or fever. Denies pain with voiding but does have some urinary frequency. Patient states she is unable to tell if this is near ovary, or related to bladder or just muscular. Advised we can offer OV to rule out GYN cause. Appt today at 1030 with Dr Quincy Simmonds since Debbi out of office.  Routing to provider for final review. Patient agreeable to disposition. Will close encounter  Just FYI.

## 2014-02-06 NOTE — Patient Instructions (Signed)
We will call you to schedule the pelvic ultrasound.

## 2014-02-06 NOTE — Progress Notes (Addendum)
Patient ID: Nancy Chandler, female   DOB: 06/09/1955, 58 y.o.   MRN: [id number]  GYNECOLOGY  VISIT   HPI: 58 y.o.   Married  [place] American  female   [phone] with Patient's last menstrual period was 02/21/2008.   here for  Evaluation of pain.   Developed pain 2 days ago.  Not sharp but it aches and is low in her LLQ and radiates to her lower back. Hurt all day yesterday.  Took Advil and pain work her up.  Up often to void.  No fevers, shakes, chills.  Hot flashes from menopause. No nausea, vomiting, constipation or diarrhea.  History of IBS in past.  No dysuria.  No hematuria.  Pain like this comes and goes over the years.  Last year had a similar pain and had a negative abdominal ultrasound. CT showed no acute findings.  1 cm nonspecific area of the liver.  Saw urology for hematuria and had cystoscopy which was normal. No prior hospitalization or surgeries for this.   Patient has underlying concerns about cancer. 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   Colonoscopy done within last 2 years.  Patient has prior hx of colonic polyps. Mother died from colon cancer.   No history of orthopedic or back problems.   GYNECOLOGIC HISTORY: Patient's last menstrual period was 02/21/2008.     Menopausal hormone therapy:         OB History    Gravida Para Term Preterm AB TAB SAB Ectopic Multiple Living   2 2 2       2          Patient Active Problem List   Diagnosis Date Noted  . Hyperlipidemia 12/08/2013  . Lichen sclerosus et atrophicus of the vulva 06/09/2013    Class: Diagnosis of  . Unspecified essential hypertension 05/12/2013    Class: History of    Past Medical History  Diagnosis Date  . Hypertension   . Abnormal Pap smear of cervix 2010    HPV  . Anxiety   . Depression   . Dyspareunia     Past Surgical History  Procedure Laterality Date  . Colposcopy  2010  . Tubal ligation  1990     Current Outpatient Prescriptions  Medication Sig Dispense Refill  . atorvastatin (LIPITOR) 40 MG tablet Take 1 tablet (40 mg total) by mouth daily. 90 tablet 3  . hydrochlorothiazide (MICROZIDE) 12.5 MG capsule Take 1 capsule (12.5 mg total) by mouth daily. 90 capsule 3  . clobetasol ointment (TEMOVATE) 1.47 % Apply 1 application topically 2 (two) times daily. (Patient not taking: Reported on 02/06/2014) 60 g 0   No current facility-administered medications for this visit.     ALLERGIES: Hydrocodone  Family History  Problem Relation Age of Onset  . Hypertension Brother   . Cancer Mother     colon  . Hypertension Father   . Heart disease Maternal Grandmother     CHF  . Cancer Maternal Grandfather     kidney  . Heart disease Paternal Grandmother     CHF  . Breast cancer Paternal Aunt   . Heart disease Paternal Aunt     open heart surgery    History   Social History  . Marital Status: Married    Spouse Name: N/A    Number of Children: N/A  . Years of Education: N/A   Occupational History  .  Not on file.   Social History Main Topics  . Smoking status: Never Smoker   . Smokeless tobacco: Never Used  . Alcohol Use: No  . Drug Use: No  . Sexual Activity:    Partners: Male    Birth Control/ Protection: Surgical     Comment: Tubal ligation   Other Topics Concern  . Not on file   Social History Narrative    ROS:  Pertinent items are noted in HPI.  PHYSICAL EXAMINATION:    BP 136/76 mmHg  Temp(Src) 98.6 F (37 C)  Resp 16  Ht 5' 3.5" (1.613 m)  Wt 140 lb 9.6 oz (63.776 kg)  BMI 24.51 kg/m2  LMP 02/21/2008     General appearance: alert, cooperative and appears stated age Lungs: clear to auscultation bilaterally Heart: regular rate and rhythm Abdomen: soft, non-tender; no masses,  no organomegaly Back:  No CVA tenderness. No abnormal inguinal nodes palpated  Pelvic: External genitalia:  no lesions              Urethra:  normal appearing urethra  with no masses, tenderness or lesions              Bartholins and Skenes: normal                 Vagina: normal appearing vagina with normal color and discharge, no lesions              Cervix: normal appearance                   Bimanual Exam:  Uterus:  uterus is normal size, shape, consistency and nontender                                      Adnexa: normal adnexa in size, nontender and no masses                                      Rectovaginal:  Yes.                                                                Confirms                                      Anus:  normal sphincter tone, no lesions  ASSESSMENT  LLQ pain.  ? Etiology. History of BTL. Microscopic hematuria with negative work up.  PLAN  Return for pelvic ultrasound.  Suggested probiotics or a stool softener in case bowel is the origin.  Return for ultrasound.    Addendum - urine micro and culture sent.  An After Visit Summary was printed and given to the patient.  ___25___ minutes face to face time of which over 50% was spent in counseling.

## 2014-02-07 LAB — URINALYSIS, MICROSCOPIC ONLY
Bacteria, UA: NONE SEEN
Casts: NONE SEEN
Crystals: NONE SEEN

## 2014-02-08 LAB — URINE CULTURE
Colony Count: NO GROWTH
Organism ID, Bacteria: NO GROWTH

## 2014-02-09 ENCOUNTER — Telehealth: Payer: Self-pay

## 2014-02-09 NOTE — Telephone Encounter (Signed)
Called patient at [phone] per DPR--# has been disconnected.  Called cell [phone] to discuss pap results,  LMOVM to call me back.

## 2014-02-09 NOTE — Telephone Encounter (Signed)
-----   Message from [place], MD sent at 02/08/2014  6:37 PM EST ----- Please inform patient of negative urine micro and culture.

## 2014-02-10 NOTE — Telephone Encounter (Signed)
Patient notified see result note 

## 2014-02-17 ENCOUNTER — Telehealth: Payer: Self-pay | Admitting: Obstetrics and Gynecology

## 2014-02-17 NOTE — Telephone Encounter (Signed)
Spoke with patient. Advised that per 2016 benefit quote received, she will be responsible to pay a $25 copay when she comes in for PUS. Patient agreeable. Scheduled PUS. Advised patient of 72 hour cancellation policy and $962 cancellation fee. Patient agreeable.

## 2014-02-24 ENCOUNTER — Ambulatory Visit (INDEPENDENT_AMBULATORY_CARE_PROVIDER_SITE_OTHER): Payer: 59 | Admitting: Family Medicine

## 2014-02-24 VITALS — BP 134/72 | HR 100 | Temp 98.2°F | Resp 18 | Ht 63.25 in | Wt 140.4 lb

## 2014-02-24 DIAGNOSIS — R05 Cough: Secondary | ICD-10-CM

## 2014-02-24 DIAGNOSIS — J0100 Acute maxillary sinusitis, unspecified: Secondary | ICD-10-CM

## 2014-02-24 DIAGNOSIS — R059 Cough, unspecified: Secondary | ICD-10-CM

## 2014-02-24 MED ORDER — AMOXICILLIN-POT CLAVULANATE 875-125 MG PO TABS: 1.0000 | ORAL_TABLET | Freq: Two times a day (BID) | ORAL | Status: DC

## 2014-02-24 MED ORDER — IPRATROPIUM BROMIDE 0.03 % NA SOLN: 2.0000 | Freq: Two times a day (BID) | NASAL | Status: DC

## 2014-02-24 MED ORDER — PROMETHAZINE-DM 6.25-15 MG/5ML PO SYRP: 5.0000 mL | ORAL_SOLUTION | Freq: Every evening | ORAL | Status: DC | PRN

## 2014-02-24 NOTE — Patient Instructions (Signed)

## 2014-02-24 NOTE — Progress Notes (Signed)
Chief Complaint:  Chief Complaint  Patient presents with  . Cough    Productive- x2 days   . Nasal Congestion    with post-nasal drip- thinks it is making her nauseous x4 days     HPI: Nancy Chandler is a 59 y.o. female who is here for  5 day hx of sinus congestion, she is having intermittent productive green cough as well, along with HA,  And now has chest congestion and is coughing up sputum more since with the mucinex.  HAs not had fever but did have chills on Sunday. She deneis ear pain, Denies CP SOb or wheezing.   Past Medical History  Diagnosis Date  . Hypertension   . Abnormal Pap smear of cervix 2010    HPV  . Anxiety   . Depression   . Dyspareunia    Past Surgical History  Procedure Laterality Date  . Colposcopy  2010  . Tubal ligation  1990   History   Social History  . Marital Status: Married    Spouse Name: N/A    Number of Children: N/A  . Years of Education: N/A   Social History Main Topics  . Smoking status: Never Smoker   . Smokeless tobacco: Never Used  . Alcohol Use: No  . Drug Use: No  . Sexual Activity:    Partners: Male    Birth Control/ Protection: Surgical     Comment: Tubal ligation   Other Topics Concern  . None   Social History Narrative   Family History  Problem Relation Age of Onset  . Hypertension Brother   . Cancer Mother     colon  . Hypertension Father   . Heart disease Maternal Grandmother     CHF  . Cancer Maternal Grandfather     kidney  . Heart disease Paternal Grandmother     CHF  . Breast cancer Paternal Aunt   . Heart disease Paternal Aunt     open heart surgery   Allergies  Allergen Reactions  . Hydrocodone Nausea And Vomiting   Prior to Admission medications   Medication Sig Start Date End Date Taking? Authorizing Provider  atorvastatin (LIPITOR) 40 MG tablet Take 1 tablet (40 mg total) by mouth daily. 12/08/13  Yes Gay Filler Copland, MD  hydrochlorothiazide (MICROZIDE) 12.5 MG capsule Take 1  capsule (12.5 mg total) by mouth daily. 12/08/13  Yes Gay Filler Copland, MD  ibuprofen (ADVIL,MOTRIN) 800 MG tablet Take 1 tablet (800 mg total) by mouth every 8 (eight) hours as needed. 02/06/14  Yes Cade Berton Lan, MD  clobetasol ointment (TEMOVATE) 6.73 % Apply 1 application topically 2 (two) times daily. Patient not taking: Reported on 02/06/2014 05/12/13   Regina Eck, CNM     ROS: The patient denies night sweats, unintentional weight loss, chest pain, palpitations, wheezing, dyspnea on exertion, nausea, vomiting, abdominal pain, dysuria, hematuria, melena, numbness, weakness, or tingling.   All other systems have been reviewed and were otherwise negative with the exception of those mentioned in the HPI and as above.    PHYSICAL EXAM: Filed Vitals:   02/24/14 0824  BP: 134/72  Pulse: 100  Temp: 98.2 F (36.8 C)  Resp: 18   Filed Vitals:   02/24/14 0824  Height: 5' 3.25" (1.607 m)  Weight: 140 lb 6.4 oz (63.685 kg)   Body mass index is 24.66 kg/(m^2).  General: Alert, no acute distress HEENT:  Normocephalic, atraumatic, oropharynx patent.  EOMI, PERRLA Erythematous throat, no exudates, TM normal, + sinus tenderness, + erythematous/boggy nasal mucosa Cardiovascular:  Regular rate and rhythm, no rubs murmurs or gallops.  No Carotid bruits, radial pulse intact. No pedal edema.  Respiratory: Clear to auscultation bilaterally.  No wheezes, rales, or rhonchi.  No cyanosis, no use of accessory musculature GI: No organomegaly, abdomen is soft and non-tender, positive bowel sounds.  No masses. Skin: No rashes. Neurologic: Facial musculature symmetric. Psychiatric: Patient is appropriate throughout our interaction. Lymphatic: No cervical lymphadenopathy Musculoskeletal: Gait intact.   LABS: Results for orders placed or performed in visit on 02/06/14  Urine Culture  Result Value Ref Range   Colony Count NO GROWTH    Organism ID, Bacteria NO GROWTH     Urine Microscopic Only  Result Value Ref Range   Squamous Epithelial / LPF FEW RARE   Crystals NONE SEEN NONE SEEN   Casts NONE SEEN NONE SEEN   WBC, UA 0-2 <3 WBC/hpf   RBC / HPF 0-2 <3 RBC/hpf   Bacteria, UA NONE SEEN RARE  POCT Urinalysis Dipstick  Result Value Ref Range   Color, UA yellow    Clarity, UA clear    Glucose, UA -    Bilirubin, UA -    Ketones, UA -    Spec Grav, UA     Blood, UA +    pH, UA 5.0    Protein, UA -    Urobilinogen, UA negative    Nitrite, UA -    Leukocytes, UA Negative      EKG/XRAY:   Primary read interpreted by Dr. Marin Comment at [facility].   ASSESSMENT/PLAN: Encounter Diagnoses  Name Primary?  . Acute maxillary sinusitis, recurrence not specified Yes  . Cough    Augmentin Atrovent NS Promethazine DM F/u prn    Gross sideeffects, risk and benefits, and alternatives of medications d/w patient. Patient is aware that all medications have potential sideeffects and we are unable to predict every sideeffect or drug-drug interaction that may occur.  LE, [place], DO 02/25/2014 3:41 PM

## 2014-03-05 ENCOUNTER — Telehealth: Payer: Self-pay | Admitting: Obstetrics and Gynecology

## 2014-03-05 ENCOUNTER — Other Ambulatory Visit: Payer: 59 | Admitting: Obstetrics and Gynecology

## 2014-03-05 ENCOUNTER — Other Ambulatory Visit: Payer: 59

## 2014-03-05 NOTE — Telephone Encounter (Signed)
Spoke with patient. Rescheduled PUS for 02.04.2016

## 2014-03-05 NOTE — Telephone Encounter (Signed)
Pus/consult appointment today needs to be rescheduled due to provider cancel. Ultrasound tech. Emergency.

## 2014-03-26 ENCOUNTER — Ambulatory Visit (INDEPENDENT_AMBULATORY_CARE_PROVIDER_SITE_OTHER): Payer: 59 | Admitting: Obstetrics and Gynecology

## 2014-03-26 ENCOUNTER — Encounter: Payer: Self-pay | Admitting: Obstetrics and Gynecology

## 2014-03-26 ENCOUNTER — Ambulatory Visit (INDEPENDENT_AMBULATORY_CARE_PROVIDER_SITE_OTHER): Payer: 59

## 2014-03-26 VITALS — BP 110/76 | HR 76 | Ht 63.5 in | Wt 142.0 lb

## 2014-03-26 DIAGNOSIS — R1032 Left lower quadrant pain: Secondary | ICD-10-CM

## 2014-03-26 DIAGNOSIS — R102 Pelvic and perineal pain: Secondary | ICD-10-CM

## 2014-03-26 NOTE — Progress Notes (Signed)
Subjective  Here for pelvic ultrasound for LLQ pain. No pain lately.  Patient has concerns about ovarian cancer.   Pain like this comes and goes over the years.  Last year had a similar pain and had a negative abdominal ultrasound. CT showed no acute findings. 1 cm nonspecific area of the liver.  Saw urology for hematuria and had cystoscopy which was normal. No prior hospitalization or surgeries for this.   Patient has underlying concerns about cancer. 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   Colonoscopy done within last 2 years.  Patient has prior hx of colonic polyps. Mother died from colon cancer.   No history of orthopedic or back problems.   Objective  Pelvic ultrasound report and images reviewed with patient.   Uterus with no masses.  EMS 1.11 mm.  Normal ovaries.  No free fluid.     Assessment  Intermittent LLQ pain of undetermined etiology.  Normal pelvic ultrasound.  No signs of ovarian or endometrial cancer.   Plan  Reassurance given to patient regarding normal reproductive anatomy.  Discussion of the potential etiologies of pain - urologic, musculoskeletal, gastrointestinal, adhesive disease.  Return for recurrence of LLQ pain.   15 minutes face to face time of which over 50% was spent in counseling.   After visit summary to patient.

## 2014-05-14 ENCOUNTER — Ambulatory Visit (INDEPENDENT_AMBULATORY_CARE_PROVIDER_SITE_OTHER): Payer: 59 | Admitting: Certified Nurse Midwife

## 2014-05-14 ENCOUNTER — Encounter: Payer: Self-pay | Admitting: Certified Nurse Midwife

## 2014-05-14 VITALS — BP 110/62 | HR 68 | Resp 16 | Ht 63.25 in | Wt 140.0 lb

## 2014-05-14 DIAGNOSIS — N644 Mastodynia: Secondary | ICD-10-CM

## 2014-05-14 DIAGNOSIS — Z01419 Encounter for gynecological examination (general) (routine) without abnormal findings: Secondary | ICD-10-CM | POA: Diagnosis not present

## 2014-05-14 NOTE — Progress Notes (Signed)
59 y.o. [id number] Married African American Fe here for annual exam. Menopausal no HRT. Denies vaginal bleeding or vaginal dryness. Sees PCP for hypertension/cholesterol management/aex and labs.  All stable to this point. Patient has noticed pain in left breast," describes as shooting pain, comes and goes ". Onset one month ago, no change, no medication needed for it. Does SBE and mammogram due. No nipple discharge or skin change.    Patient's last menstrual period was 02/21/2008.          Sexually active: Yes.    The current method of family planning is tubal ligation.    Exercising: No.  exercise Smoker:  no  Health Maintenance: Pap: 05-12-13 neg HPV HR neg MMG: 02-24-13 benign Colonoscopy:  2014 neg per patient BMD:   2012 TDaP:  10/15 Labs: pcp Self breast exam: done monthly   reports that she has never smoked. She has never used smokeless tobacco. She reports that she does not drink alcohol or use illicit drugs.  Past Medical History  Diagnosis Date  . Hypertension   . Abnormal Pap smear of cervix 2010    HPV  . Anxiety   . Depression   . Dyspareunia   . Lichen sclerosus     Past Surgical History  Procedure Laterality Date  . Colposcopy  2010  . Tubal ligation  1990    Current Outpatient Prescriptions  Medication Sig Dispense Refill  . atorvastatin (LIPITOR) 40 MG tablet Take 1 tablet (40 mg total) by mouth daily. 90 tablet 3  . hydrochlorothiazide (MICROZIDE) 12.5 MG capsule Take 1 capsule (12.5 mg total) by mouth daily. 90 capsule 3   No current facility-administered medications for this visit.    Family History  Problem Relation Age of Onset  . Hypertension Brother   . Cancer Mother     colon  . Hypertension Father   . Heart disease Maternal Grandmother     CHF  . Cancer Maternal Grandfather     kidney  . Heart disease Paternal Grandmother     CHF  . Breast cancer Paternal Aunt   . Heart disease Paternal Aunt     open heart surgery    ROS:  Pertinent  items are noted in HPI.  Otherwise, a comprehensive ROS was negative.  Exam:   BP 110/62 mmHg  Pulse 68  Resp 16  Ht 5' 3.25" (1.607 m)  Wt 140 lb (63.504 kg)  BMI 24.59 kg/m2  LMP 02/21/2008 Height: 5' 3.25" (160.7 cm) Ht Readings from Last 3 Encounters:  05/14/14 5' 3.25" (1.607 m)  03/26/14 5' 3.5" (1.613 m)  02/24/14 5' 3.25" (1.607 m)    General appearance: alert, cooperative and appears stated age Head: Normocephalic, without obvious abnormality, atraumatic Neck: no adenopathy, supple, symmetrical, trachea midline and thyroid normal to inspection and palpation Lungs: clear to auscultation bilaterally Breasts: normal appearance, no masses or tenderness, No nipple retraction or dimpling, No nipple discharge or bleeding, No axillary or supraclavicular adenopathy Heart: regular rate and rhythm Abdomen: soft, non-tender; no masses,  no organomegaly Extremities: extremities normal, atraumatic, no cyanosis or edema Skin: Skin color, texture, turgor normal. No rashes or lesions Lymph nodes: Cervical, supraclavicular, and axillary nodes normal. No abnormal inguinal nodes palpated Neurologic: Grossly normal   Pelvic: External genitalia:  no lesions, loss pf pigmentation unchanged from previous visits, no itching or need for Clobetasol in past year              Urethra:  normal appearing urethra with  no masses, tenderness or lesions              Bartholin's and Skene's: normal                 Vagina: normal appearing vagina with normal color and discharge, no lesions              Cervix: normal non tender, no lesions              Pap taken: No. Bimanual Exam:  Uterus:  normal size, contour, position, consistency, mobility, non-tender              Adnexa: normal adnexa and no mass, fullness, tenderness               Rectovaginal: Confirms               Anus:  normal sphincter tone, no lesions  Chaperone present: yes  A:  Well Woman with normal exam  Menopausal no HRT  Left  breast tenderness with normal exam, mammogram due   History of Lichen Sclerosus no flares in past year  Cholesterol and hypertension management with PCP  P:   Reviewed health and wellness pertinent to exam  Aware of need to evaluate if vaginal bleeding  Discussed evaluation with diagnostic mammogram , patient agreeable. Will schedule today  Has Rx if needed  Continue follow up with PCP as indicated  Pap smear not taken today   counseled on Diet, exercise, calcium, Vitamin D, SBE.  return annually or prn  An After Visit Summary was printed and given to the patient.

## 2014-05-14 NOTE — Progress Notes (Signed)
Scheduled patient while in office for diagnostic mammogram and ultrasound of the left breast at [facility]. Appointment scheduled for 3/31 at 2:30pm. Patient is agreeable to date and time.

## 2014-05-14 NOTE — Patient Instructions (Signed)

## 2014-05-20 NOTE — Progress Notes (Signed)
Reviewed personally.  M. Suzanne Meghan Tiemann, MD.  

## 2014-08-18 ENCOUNTER — Ambulatory Visit (INDEPENDENT_AMBULATORY_CARE_PROVIDER_SITE_OTHER): Payer: 59 | Admitting: Family Medicine

## 2014-08-18 ENCOUNTER — Ambulatory Visit (INDEPENDENT_AMBULATORY_CARE_PROVIDER_SITE_OTHER): Payer: 59

## 2014-08-18 VITALS — BP 151/73 | HR 69 | Temp 97.9°F | Resp 16 | Ht 63.0 in | Wt 145.4 lb

## 2014-08-18 DIAGNOSIS — K219 Gastro-esophageal reflux disease without esophagitis: Secondary | ICD-10-CM

## 2014-08-18 DIAGNOSIS — R1013 Epigastric pain: Secondary | ICD-10-CM

## 2014-08-18 DIAGNOSIS — R079 Chest pain, unspecified: Secondary | ICD-10-CM

## 2014-08-18 LAB — TROPONIN I: Troponin I: <0.01 ng/mL (ref <0.06)

## 2014-08-18 MED ORDER — SUCRALFATE 1 GM/10ML PO SUSP: 1.0000 g | Freq: Three times a day (TID) | ORAL | Status: DC

## 2014-08-18 MED ORDER — ASPIRIN EC 81 MG PO TBEC
81.0000 mg | DELAYED_RELEASE_TABLET | Freq: Every day | ORAL | Status: DC
Start: 2014-08-18 — End: 2015-05-19

## 2014-08-18 MED ORDER — GI COCKTAIL ~~LOC~~
30.0000 mL | Freq: Once | ORAL | Status: AC
  Administered 2014-08-18: 30 mL via ORAL

## 2014-08-18 MED ORDER — RANITIDINE HCL 150 MG PO TABS: 150.0000 mg | ORAL_TABLET | Freq: Two times a day (BID) | ORAL | Status: DC

## 2014-08-18 NOTE — Progress Notes (Signed)
I saw this pt along with Ms. Ricki Miller.  EKG is normal.  Compared to EKG from 2014 and they are virtually identical. Markeya has noted vague intermittent chest discomfort for a couple of week. She denies any exertional CP.  She noted recurrent CP last night while laying in bed.  Currently she denies any pain but states that her chest does not feel 100% normal.  No SOB.  Discussed in detail with pt- she has left sided CP which is atypical and reproducible, and improved epigastric tenderness after GI cocktail. Suspect costochondritis and reflux, but cannot rule-out cardiac ischemia.  Advised that her safest course of action would be to go to the ER for full cardiac rule- out. She declined to do this but is willing to have a stat troponin.  Gave her asa 325.  Will refer to cardiology for outpt eval assuming that her troponin is negative.  Asked her to take an aspirin daily while awaiting cardiology referral.  Advised her to seek care if any worsening or change of her sx in the meantime   Results for orders placed or performed in visit on 08/18/14  Troponin I  Result Value Ref Range   Troponin I <0.01 <0.06 ng/mL   CHEST - 2 VIEW  COMPARISON: 02/04/2013  FINDINGS: The heart size and mediastinal contours are within normal limits. Both lungs are clear. The visualized skeletal structures are unremarkable. Bilateral nipple shadows are noted.  IMPRESSION: No active disease.

## 2014-08-18 NOTE — Progress Notes (Signed)
Subjective:    Patient ID: Nancy Chandler, female    DOB: Feb 23, 1955, 59 y.o.   MRN: [id number]  HPI Patient with PMH of HTN and dyslipidemia presents for left sided chest pain that has been present for past 2 weeks. Pain for the most part does not radiate and is sporadic, however, has worse in the middle of the night. Pain is achy in quality. Denies SOB, palpiations, fever, vomiting, HA, dizziness, presyncope, edema, flatulence, or belching. Endorses nausea at times and change in bowel movement frequency (decreased). Denies change in diet, but endorse 6 lb weigh gain over the past 6 months. Is usually compliant with all medication, but did not take HCTZ over the weekend while on the road traveling.   Has never smoked or used illicit drugs. Rarely drinks alcohol. Med allergy: Hydrocodone.   Review of Systems  Constitutional: Negative for fever, activity change and appetite change.  Respiratory: Negative for cough, shortness of breath and wheezing.   Cardiovascular: Positive for chest pain. Negative for palpitations and leg swelling.  Gastrointestinal: Positive for nausea. Negative for vomiting, abdominal pain, diarrhea, constipation and abdominal distention.  Neurological: Negative for dizziness, syncope, weakness and headaches.       Objective:   Physical Exam  Constitutional: She is oriented to person, place, and time. She appears well-developed and well-nourished. No distress.  Blood pressure 151/73, pulse 69, temperature 97.9 F (36.6 C), temperature source Oral, resp. rate 16, height 5\' 3"  (1.6 m), weight 145 lb 6.4 oz (65.953 kg), last menstrual period 02/21/2008, SpO2 97 %.  HENT:  Head: Normocephalic and atraumatic.  Right Ear: External ear normal.  Left Ear: External ear normal.  Eyes: Conjunctivae are normal. Pupils are equal, round, and reactive to light. Right eye exhibits no discharge. Left eye exhibits no discharge. No scleral icterus.  Neck: Normal range of motion. Neck  supple. No JVD present.  Cardiovascular: Normal rate, regular rhythm, normal heart sounds and intact distal pulses.  Exam reveals no gallop and no friction rub.   No murmur heard. Pulmonary/Chest: Effort normal and breath sounds normal. No stridor. No respiratory distress. She has no decreased breath sounds. She has no wheezes. She has no rhonchi. She has no rales. She exhibits tenderness (left sided).  Abdominal: Soft. Bowel sounds are normal. She exhibits no distension and no mass. There is tenderness in the epigastric area. There is no guarding.  Musculoskeletal: She exhibits no edema.  Lymphadenopathy:    She has no cervical adenopathy.  Neurological: She is alert and oriented to person, place, and time.  Skin: Skin is warm and dry. No rash noted. She is not diaphoretic. No erythema. No pallor.  Psychiatric: She has a normal mood and affect. Her behavior is normal. Judgment and thought content normal.   EKG with Dr. Lorelei Pont: normal EKG, normal sinus rhythm.  [facility] reading (PRIMARY) by  Dr. Lorelei Pont. No acute cardiopulmonary changes.  Epigastric pain improved with GI cocktail, however, chest pain persist.  Results for orders placed or performed in visit on 08/18/14  Troponin I  Result Value Ref Range   Troponin I <0.01 <0.06 ng/mL       Assessment & Plan:  1. Gastroesophageal reflux disease, esophagitis presence not specified - sucralfate (CARAFATE) 1 GM/10ML suspension; Take 10 mLs (1 g total) by mouth 4 (four) times daily -  with meals and at bedtime.  Dispense: 420 mL; Refill: 0 - ranitidine (ZANTAC) 150 MG tablet; Take 1 tablet (150 mg total) by mouth 2 (two)  times daily.  Dispense: 60 tablet; Refill: 1  2. Chest pain, unspecified chest pain type - EKG 12-Lead - DG Chest 2 View; Future - Troponin I - aspirin EC 81 MG tablet; Take 1 tablet (81 mg total) by mouth daily.  Dispense: 30 tablet; Refill: 1 - Ambulatory referral to Cardiology  3. Abdominal pain, epigastric Improved  with cocktail. - gi cocktail (Maalox,Lidocaine,Donnatal); Take 30 mLs by mouth once.   Alveta Heimlich PA-C  [facility] 08/18/2014 3:36 PM

## 2014-08-18 NOTE — Patient Instructions (Signed)
Gastroesophageal Reflux Disease, Adult Gastroesophageal reflux disease (GERD) happens when acid from your stomach flows up into the esophagus. When acid comes in contact with the esophagus, the acid causes soreness (inflammation) in the esophagus. Over time, GERD may create small holes (ulcers) in the lining of the esophagus. CAUSES   Increased body weight. This puts pressure on the stomach, making acid rise from the stomach into the esophagus.  Smoking. This increases acid production in the stomach.  Drinking alcohol. This causes decreased pressure in the lower esophageal sphincter (valve or ring of muscle between the esophagus and stomach), allowing acid from the stomach into the esophagus.  Late evening meals and a full stomach. This increases pressure and acid production in the stomach.  A malformed lower esophageal sphincter. Sometimes, no cause is found. SYMPTOMS   Burning pain in the lower part of the mid-chest behind the breastbone and in the mid-stomach area. This may occur twice a week or more often.  Trouble swallowing.  Sore throat.  Dry cough.  Asthma-like symptoms including chest tightness, shortness of breath, or wheezing. DIAGNOSIS  Your caregiver may be able to diagnose GERD based on your symptoms. In some cases, X-rays and other tests may be done to check for complications or to check the condition of your stomach and esophagus. TREATMENT  Your caregiver may recommend over-the-counter or prescription medicines to help decrease acid production. Ask your caregiver before starting or adding any new medicines.  HOME CARE INSTRUCTIONS   Change the factors that you can control. Ask your caregiver for guidance concerning weight loss, quitting smoking, and alcohol consumption.  Avoid foods and drinks that make your symptoms worse, such as:  Caffeine or alcoholic drinks.  Chocolate.  Peppermint or mint flavorings.  Garlic and onions.  Spicy foods.  Citrus fruits,  such as oranges, lemons, or limes.  Tomato-based foods such as sauce, chili, salsa, and pizza.  Fried and fatty foods.  Avoid lying down for the 3 hours prior to your bedtime or prior to taking a nap.  Eat small, frequent meals instead of large meals.  Wear loose-fitting clothing. Do not wear anything tight around your waist that causes pressure on your stomach.  Raise the head of your bed 6 to 8 inches with wood blocks to help you sleep. Extra pillows will not help.  Only take over-the-counter or prescription medicines for pain, discomfort, or fever as directed by your caregiver.  Do not take aspirin, ibuprofen, or other nonsteroidal anti-inflammatory drugs (NSAIDs). SEEK IMMEDIATE MEDICAL CARE IF:   You have pain in your arms, neck, jaw, teeth, or back.  Your pain increases or changes in intensity or duration.  You develop nausea, vomiting, or sweating (diaphoresis).  You develop shortness of breath, or you faint.  Your vomit is green, yellow, black, or looks like coffee grounds or blood.  Your stool is red, bloody, or black. These symptoms could be signs of other problems, such as heart disease, gastric bleeding, or esophageal bleeding. MAKE SURE YOU:   Understand these instructions.  Will watch your condition.  Will get help right away if you are not doing well or get worse. Document Released: 11/16/2004 Document Revised: 05/01/2011 Document Reviewed: 08/26/2010 Exit[employer] Patient Information 2015 [employer]. This information is not intended to replace advice given to you by your health care provider. Make sure you discuss any questions you have with your health care provider.  

## 2014-09-28 ENCOUNTER — Encounter: Payer: Self-pay | Admitting: Family Medicine

## 2014-09-28 ENCOUNTER — Ambulatory Visit (INDEPENDENT_AMBULATORY_CARE_PROVIDER_SITE_OTHER): Payer: 59 | Admitting: Family Medicine

## 2014-09-28 VITALS — BP 120/72 | HR 62 | Temp 97.7°F | Resp 16 | Ht 63.5 in | Wt 142.0 lb

## 2014-09-28 DIAGNOSIS — Z87892 Personal history of anaphylaxis: Secondary | ICD-10-CM

## 2014-09-28 DIAGNOSIS — Z131 Encounter for screening for diabetes mellitus: Secondary | ICD-10-CM

## 2014-09-28 DIAGNOSIS — I10 Essential (primary) hypertension: Secondary | ICD-10-CM | POA: Diagnosis not present

## 2014-09-28 DIAGNOSIS — R1900 Intra-abdominal and pelvic swelling, mass and lump, unspecified site: Secondary | ICD-10-CM

## 2014-09-28 DIAGNOSIS — Z119 Encounter for screening for infectious and parasitic diseases, unspecified: Secondary | ICD-10-CM | POA: Diagnosis not present

## 2014-09-28 DIAGNOSIS — E785 Hyperlipidemia, unspecified: Secondary | ICD-10-CM | POA: Diagnosis not present

## 2014-09-28 DIAGNOSIS — Z130 Encounter for screening for diseases of the blood and blood-forming organs and certain disorders involving the immune mechanism: Secondary | ICD-10-CM | POA: Diagnosis not present

## 2014-09-28 DIAGNOSIS — E119 Type 2 diabetes mellitus without complications: Secondary | ICD-10-CM | POA: Diagnosis not present

## 2014-09-28 LAB — COMPREHENSIVE METABOLIC PANEL
ALT: 18 U/L (ref 6–29)
AST: 19 U/L (ref 10–35)
Albumin: 3.8 g/dL (ref 3.6–5.1)
Alkaline Phosphatase: 86 U/L (ref 33–130)
BUN: 13 mg/dL (ref 7–25)
CO2: 27 mmol/L (ref 20–31)
Calcium: 9.3 mg/dL (ref 8.6–10.4)
Chloride: 104 mmol/L (ref 98–110)
Creat: 0.89 mg/dL (ref 0.50–0.99)
Glucose, Bld: 88 mg/dL (ref 65–99)
Potassium: 4.3 mmol/L (ref 3.5–5.3)
Sodium: 142 mmol/L (ref 135–146)
Total Bilirubin: 0.5 mg/dL (ref 0.2–1.2)
Total Protein: 7.1 g/dL (ref 6.1–8.1)

## 2014-09-28 LAB — LIPID PANEL
Cholesterol: 183 mg/dL (ref 125–200)
HDL: 71 mg/dL (ref >=46)
LDL Cholesterol: 99 mg/dL (ref <130)
Total CHOL/HDL Ratio: 2.6 Ratio (ref <=5.0)
Triglycerides: 63 mg/dL (ref <150)
VLDL: 13 mg/dL (ref <30)

## 2014-09-28 LAB — CBC
HCT: 41.7 % (ref 36.0–46.0)
Hemoglobin: 13.8 g/dL (ref 12.0–15.0)
MCH: 26.3 pg (ref 26.0–34.0)
MCHC: 33.1 g/dL (ref 30.0–36.0)
MCV: 79.6 fL (ref 78.0–100.0)
MPV: 10 fL (ref 8.6–12.4)
Platelets: 191 10*3/uL (ref 150–400)
RBC: 5.24 MIL/uL — ABNORMAL HIGH (ref 3.87–5.11)
RDW: 13.9 % (ref 11.5–15.5)
WBC: 5.3 10*3/uL (ref 4.0–10.5)

## 2014-09-28 MED ORDER — EPINEPHRINE 0.3 MG/0.3ML IJ SOAJ: 0.3000 mg | Freq: Once | INTRAMUSCULAR | Status: DC

## 2014-09-28 NOTE — Patient Instructions (Addendum)
I will be in touch with your labs asap and will fax in your form to the insurance company. Take care!   I also did your one time HIV and Hepatitis C screening today- this is standard for all patients

## 2014-09-28 NOTE — Progress Notes (Addendum)
[facility] [address], [place] [address] [phone]  Date:  09/28/2014   Name:  Nancy Chandler   DOB:  10/20/1955   MRN:  [id number]  PCP:  Nancy Blinks, MD    Chief Complaint: Follow-up; Hyperlipidemia; and Medication Refill   History of Present Illness:  Nancy Chandler is a 59 y.o. very pleasant female patient who presents with the following:  She was here in June with likely GERD related CP.  She has not had any more episodes of CP.  She has an appt on 8/23 with cardiology for evaluation She would like to have labs today- she is fasting today for labs  She does note some occasional lower belly discomfort- "not a pain, it feels like "butterflies."  No nausea.  She has noted this for a few weeks.  Admits that she tends to worry about cancers, but would like to do a Ca125 if possible She still has her ovaries, but is menopausal- no bleeding noted   She had a recent negative colonoscopy- in 2013.  She also had a negative WU for microscopic hematuria in 2015 per urology  Patient Active Problem List   Diagnosis Date Noted  . Hyperlipidemia 12/08/2013  . Lichen sclerosus et atrophicus of the vulva 06/09/2013    Class: Diagnosis of  . Unspecified essential hypertension 05/12/2013    Class: History of    Past Medical History  Diagnosis Date  . Hypertension   . Abnormal Pap smear of cervix 2010    HPV  . Anxiety   . Depression   . Dyspareunia   . Lichen sclerosus     Past Surgical History  Procedure Laterality Date  . Colposcopy  2010  . Tubal ligation  1990    History  Substance Use Topics  . Smoking status: Never Smoker   . Smokeless tobacco: Never Used  . Alcohol Use: No    Family History  Problem Relation Age of Onset  . Hypertension Brother   . Cancer Mother     colon  . Hypertension Father   . Heart disease Maternal Grandmother     CHF  . Cancer Maternal Grandfather     kidney  . Heart disease Paternal Grandmother     CHF   . Breast cancer Paternal Aunt   . Heart disease Paternal Aunt     open heart surgery    Allergies  Allergen Reactions  . Hydrocodone Nausea And Vomiting    Medication list has been reviewed and updated.  Current Outpatient Prescriptions on File Prior to Visit  Medication Sig Dispense Refill  . aspirin EC 81 MG tablet Take 1 tablet (81 mg total) by mouth daily. 30 tablet 1  . atorvastatin (LIPITOR) 40 MG tablet Take 1 tablet (40 mg total) by mouth daily. 90 tablet 3  . hydrochlorothiazide (MICROZIDE) 12.5 MG capsule Take 1 capsule (12.5 mg total) by mouth daily. 90 capsule 3  . ranitidine (ZANTAC) 150 MG tablet Take 1 tablet (150 mg total) by mouth 2 (two) times daily. 60 tablet 1   No current facility-administered medications on file prior to visit.    Review of Systems:  As per HPI- otherwise negative.   Physical Examination: Filed Vitals:   09/28/14 0917  BP: 120/72  Pulse: 62  Temp: 97.7 F (36.5 C)  Resp: 16   Filed Vitals:   09/28/14 0917  Height: 5' 3.5" (1.613 m)  Weight: 142 lb (64.411 kg)   Body mass index  is 24.76 kg/(m^2). Ideal Body Weight: Weight in (lb) to have BMI = 25: 143.1  GEN: WDWN, NAD, Non-toxic, A & O x 3, looks well HEENT: Atraumatic, Normocephalic. Neck supple. No masses, No LAD. Ears and Nose: No external deformity. CV: RRR, No M/G/R. No JVD. No thrill. No extra heart sounds. PULM: CTA B, no wheezes, crackles, rhonchi. No retractions. No resp. distress. No accessory muscle use. ABD: S, NT, ND. No rebound. No HSM.  No abnormality noted in exam EXTR: No c/c/e NEURO Normal gait.  PSYCH: Normally interactive. Conversant. Not depressed or anxious appearing.  Calm demeanor.    Assessment and Plan: Hyperlipidemia - Plan: Lipid panel  Essential hypertension - Plan: Comprehensive metabolic panel  Screening for diabetes mellitus - Plan: Hemoglobin A1c  History of anaphylaxis - Plan: EPINEPHrine 0.3 mg/0.3 mL IJ SOAJ injection  Pelvic  fullness - Plan: CA 125  Screening for deficiency anemia - Plan: CBC  Screening examination for infectious disease - Plan: HIV antibody, Hepatitis C antibody  Did screening labs as above, and will fill out her biometric form for her when results in Refilled her epipen She notes a vague feeling of something unusual in her pelvis. Will do a Ca-125 for her as we are drawing blood today Will plan further follow- up pending labs.   Signed Nancy Blinks, MD  Called her to discuss labs as below Her Ca125 is normal and she is relived.   However she does have DM by A1c today.  Discussed with her- will start metfrmin 500 qd for one week, then BID. Encouraged her to decrease carbs, try to lose 5- 10 lbs and to review the ADA website.  Plan to recheck in 4 months  Results for orders placed or performed in visit on 09/28/14  Lipid panel  Result Value Ref Range   Cholesterol 183 125 - 200 mg/dL   Triglycerides 63 <150 mg/dL   HDL 71 >=46 mg/dL   Total CHOL/HDL Ratio 2.6 <=5.0 Ratio   VLDL 13 <30 mg/dL   LDL Cholesterol 99 <130 mg/dL  CBC  Result Value Ref Range   WBC 5.3 4.0 - 10.5 K/uL   RBC 5.24 (H) 3.87 - 5.11 MIL/uL   Hemoglobin 13.8 12.0 - 15.0 g/dL   HCT 41.7 36.0 - 46.0 %   MCV 79.6 78.0 - 100.0 fL   MCH 26.3 26.0 - 34.0 pg   MCHC 33.1 30.0 - 36.0 g/dL   RDW 13.9 11.5 - 15.5 %   Platelets 191 150 - 400 K/uL   MPV 10.0 8.6 - 12.4 fL  Comprehensive metabolic panel  Result Value Ref Range   Sodium 142 135 - 146 mmol/L   Potassium 4.3 3.5 - 5.3 mmol/L   Chloride 104 98 - 110 mmol/L   CO2 27 20 - 31 mmol/L   Glucose, Bld 88 65 - 99 mg/dL   BUN 13 7 - 25 mg/dL   Creat 0.89 0.50 - 0.99 mg/dL   Total Bilirubin 0.5 0.2 - 1.2 mg/dL   Alkaline Phosphatase 86 33 - 130 U/L   AST 19 10 - 35 U/L   ALT 18 6 - 29 U/L   Total Protein 7.1 6.1 - 8.1 g/dL   Albumin 3.8 3.6 - 5.1 g/dL   Calcium 9.3 8.6 - 10.4 mg/dL  Hemoglobin A1c  Result Value Ref Range   Hgb A1c MFr Bld 6.5 (H) <5.7  %   Mean Plasma Glucose 140 (H) <117 mg/dL  CA 125  Result  Value Ref Range   CA 125 7 <35 U/mL  HIV antibody  Result Value Ref Range   HIV 1&2 Ab, 4th Generation NONREACTIVE NONREACTIVE  Hepatitis C antibody  Result Value Ref Range   HCV Ab NEGATIVE NEGATIVE

## 2014-09-29 ENCOUNTER — Encounter: Payer: Self-pay | Admitting: Family Medicine

## 2014-09-29 ENCOUNTER — Telehealth: Payer: Self-pay | Admitting: Family Medicine

## 2014-09-29 DIAGNOSIS — E119 Type 2 diabetes mellitus without complications: Secondary | ICD-10-CM | POA: Insufficient documentation

## 2014-09-29 DIAGNOSIS — E1169 Type 2 diabetes mellitus with other specified complication: Secondary | ICD-10-CM | POA: Insufficient documentation

## 2014-09-29 LAB — HEPATITIS C ANTIBODY: HCV Ab: NEGATIVE

## 2014-09-29 LAB — HEMOGLOBIN A1C
Hgb A1c MFr Bld: 6.5 % — ABNORMAL HIGH (ref <5.7)
Mean Plasma Glucose: 140 mg/dL — ABNORMAL HIGH (ref <117)

## 2014-09-29 LAB — HIV ANTIBODY (ROUTINE TESTING W REFLEX): HIV 1&2 Ab, 4th Generation: NONREACTIVE

## 2014-09-29 LAB — CA 125: CA 125: 7 U/mL (ref <35)

## 2014-09-29 MED ORDER — METFORMIN HCL 500 MG PO TABS: 500.0000 mg | ORAL_TABLET | Freq: Two times a day (BID) | ORAL | Status: DC

## 2014-09-29 NOTE — Telephone Encounter (Signed)
Called her [id number]

## 2014-10-12 ENCOUNTER — Ambulatory Visit: Payer: 59 | Admitting: Family Medicine

## 2014-10-13 ENCOUNTER — Encounter: Payer: Self-pay | Admitting: Cardiology

## 2014-10-13 ENCOUNTER — Ambulatory Visit (INDEPENDENT_AMBULATORY_CARE_PROVIDER_SITE_OTHER): Payer: 59 | Admitting: Cardiology

## 2014-10-13 VITALS — BP 130/54 | HR 71 | Ht 63.5 in | Wt 144.8 lb

## 2014-10-13 DIAGNOSIS — K219 Gastro-esophageal reflux disease without esophagitis: Secondary | ICD-10-CM | POA: Diagnosis not present

## 2014-10-13 DIAGNOSIS — R079 Chest pain, unspecified: Secondary | ICD-10-CM | POA: Diagnosis not present

## 2014-10-13 DIAGNOSIS — I10 Essential (primary) hypertension: Secondary | ICD-10-CM | POA: Diagnosis not present

## 2014-10-13 DIAGNOSIS — E785 Hyperlipidemia, unspecified: Secondary | ICD-10-CM

## 2014-10-13 NOTE — Patient Instructions (Signed)
Medication Instructions:  Your physician recommends that you continue on your current medications as directed. Please refer to the Current Medication list given to you today.   Labwork: None  Testing/Procedures: Dr. Radford Pax recommends you have an [place].  Follow-Up: Your physician recommends that you schedule a follow-up appointment AS NEEDED with Dr. Radford Pax pending your test results.  Any Other Special Instructions Will Be Listed Below (If Applicable).

## 2014-10-13 NOTE — Progress Notes (Signed)
Cardiology Office Note   Date:  10/13/2014   ID:  Nancy Chandler, DOB [birth year], MRN [id number]  PCP:  Lamar Blinks, MD    Chief Complaint  Patient presents with  . chest pain, unspecified      History of Present Illness: Nancy Chandler is a 59 y.o. female who presents for evaluation of chest pain.  She has a history of GERD and it was felt by her PCP to be related to GERD.  Her CP was left sided with no radiation and would occur sporadically but worse at night.  There is no associated SOB, palpitations, diaphoresis but occasionally has some nausea.  She was started on carafate and zantac and pain resolved.  She is now here for cardiac evaluation.  She has a strong family history of CAD on her father's side of the family.  She has a history of HTN, DM and dyslipidemia.     Past Medical History  Diagnosis Date  . Hypertension   . Abnormal Pap smear of cervix 2010    HPV  . Anxiety   . Depression   . Dyspareunia   . Lichen sclerosus   . Hyperlipidemia   . Diabetes mellitus without complication     Past Surgical History  Procedure Laterality Date  . Colposcopy  2010  . Tubal ligation  1990     Current Outpatient Prescriptions  Medication Sig Dispense Refill  . aspirin EC 81 MG tablet Take 1 tablet (81 mg total) by mouth daily. 30 tablet 1  . atorvastatin (LIPITOR) 40 MG tablet Take 40 mg by mouth daily.    Marland Kitchen EPINEPHrine 0.3 mg/0.3 mL IJ SOAJ injection Inject 0.3 mLs (0.3 mg total) into the muscle once. Use in case of allergic reaction emergency 1 Device 2  . hydrochlorothiazide (MICROZIDE) 12.5 MG capsule Take 1 capsule (12.5 mg total) by mouth daily. 90 capsule 3  . metFORMIN (GLUCOPHAGE) 500 MG tablet Take 1 tablet (500 mg total) by mouth 2 (two) times daily with a meal. 180 tablet 3  . ranitidine (ZANTAC) 150 MG tablet Take 1 tablet (150 mg total) by mouth 2 (two) times daily. 60 tablet 1   No current facility-administered medications for this  visit.    Allergies:   Hydrocodone    Social History:  The patient  reports that she has never smoked. She has never used smokeless tobacco. She reports that she does not drink alcohol or use illicit drugs.   Family History:  The patient's family history includes Alcohol abuse in her father; Breast cancer in her paternal aunt; Cancer in her maternal grandfather and mother; Diabetes in her father; Heart disease in her maternal grandmother, paternal aunt, and paternal grandmother; Hypertension in her brother and father.    ROS:  Please see the history of present illness.   Otherwise, review of systems are positive for none.   All other systems are reviewed and negative.    PHYSICAL EXAM: VS:  BP 130/54 mmHg  Pulse 71  Ht 5' 3.5" (1.613 m)  Wt 144 lb 12.8 oz (65.681 kg)  BMI 25.24 kg/m2  SpO2 99%  LMP 02/21/2008 , BMI Body mass index is 25.24 kg/(m^2). GEN: Well nourished, well developed, in no acute distress HEENT: normal Neck: no JVD, carotid bruits, or masses Cardiac: RRR; no murmurs, rubs, or gallops,no edema  Respiratory:  clear to auscultation bilaterally, normal work of breathing GI: soft,  nontender, nondistended, + BS MS: no deformity or atrophy Skin: warm and dry, no rash Neuro:  Strength and sensation are intact Psych: euthymic mood, full affect   EKG:  EKG is not ordered today.    Recent Labs: 09/28/2014: ALT 18; BUN 13; Creat 0.89; Hemoglobin 13.8; Platelets 191; Potassium 4.3; Sodium 142    Lipid Panel    Component Value Date/Time   CHOL 183 09/28/2014 0944   TRIG 63 09/28/2014 0944   HDL 71 09/28/2014 0944   CHOLHDL 2.6 09/28/2014 0944   VLDL 13 09/28/2014 0944   LDLCALC 99 09/28/2014 0944      Wt Readings from Last 3 Encounters:  10/13/14 144 lb 12.8 oz (65.681 kg)  09/28/14 142 lb (64.411 kg)  08/18/14 145 lb 6.4 oz (65.953 kg)        ASSESSMENT AND PLAN:  1.  Chest pain that is atypical and most likely associated with GERD.  It has improved  but not completely resolved with Zantac.  I occurs mainly at night and not with exertion.  She does have risk factors including post menopausal, family history of CAD, dyslipidemia, DM and HTN.  I will set her up for a Stress myoview.  EKG from MD office is nonischemic. 2.  HTN - controlled on HCTZ 3. GERD - continue Zantac 4.  Dyslipidemia - continue Lipitor 5.  DM - per PCP  Current medicines are reviewed at length with the patient today.  The patient does not have concerns regarding medicines.  The following changes have been made:  no change  Labs/ tests ordered today: See above Assessment and Plan No orders of the defined types were placed in this encounter.     Disposition:   FU with me PRN pending results of studies  Signed, Sueanne Margarita, MD  10/13/2014 3:24 PM    [facility] [place], [place],   [address] Phone: [phone]; Fax: [phone]

## 2014-10-21 ENCOUNTER — Telehealth (HOSPITAL_COMMUNITY): Payer: Self-pay | Admitting: *Deleted

## 2014-10-21 NOTE — Telephone Encounter (Signed)
Patient given detailed instructions per Myocardial Perfusion Study Information Sheet for test on 10/23/14 at 0845. Patient Notified to arrive 15 minutes early, and that it is imperative to arrive on time for appointment to keep from having the test rescheduled. Patient verbalized understanding. Rilea Arutyunyan, Ranae Palms

## 2014-10-23 ENCOUNTER — Ambulatory Visit (HOSPITAL_COMMUNITY): Payer: 59 | Attending: Cardiology

## 2014-10-23 DIAGNOSIS — R079 Chest pain, unspecified: Secondary | ICD-10-CM

## 2014-10-23 DIAGNOSIS — I10 Essential (primary) hypertension: Secondary | ICD-10-CM | POA: Insufficient documentation

## 2014-10-23 LAB — MYOCARDIAL PERFUSION IMAGING
Estimated workload: 6.4 METS
Exercise duration (min): 4 min
Exercise duration (sec): 30 s
LV dias vol: 62 mL
LV sys vol: 27 mL
MPHR: 160 {beats}/min
Peak HR: 162 {beats}/min
Percent HR: 101 %
RATE: 0.27
RPE: 15
Rest HR: 67 {beats}/min
SDS: 2
SRS: 0
SSS: 2
TID: 0.93

## 2014-10-23 MED ORDER — TECHNETIUM TC 99M SESTAMIBI GENERIC - CARDIOLITE: 10.1000 | Freq: Once | INTRAVENOUS | Status: DC | PRN

## 2014-10-23 MED ORDER — TECHNETIUM TC 99M SESTAMIBI GENERIC - CARDIOLITE
32.7000 | Freq: Once | INTRAVENOUS | Status: AC | PRN
  Administered 2014-10-23: 32.7 via INTRAVENOUS

## 2014-10-23 MED ORDER — TECHNETIUM TC 99M SESTAMIBI GENERIC - CARDIOLITE
10.1000 | Freq: Once | INTRAVENOUS | Status: AC | PRN
  Administered 2014-10-23: 10.1 via INTRAVENOUS

## 2014-11-17 ENCOUNTER — Telehealth: Payer: Self-pay

## 2014-11-23 ENCOUNTER — Telehealth: Payer: Self-pay

## 2014-11-23 DIAGNOSIS — E119 Type 2 diabetes mellitus without complications: Secondary | ICD-10-CM

## 2014-11-23 NOTE — Telephone Encounter (Signed)
Spoke with pt, she states the Metformin makes her really nauseous. She is taking the medication with food. Pt started taking it and back in August and she tried to give it time to work. She stopped it for a week and she was fine. Then started again and she still was sick. Please advise.

## 2014-11-23 NOTE — Telephone Encounter (Signed)
Called her back- She has been working on her diet and has been checking her glucose.   She is generally running 100 or less.  She cannot tolerate metformin and has given it a good try We will try diet control and no meds, she will come by for an A1c next month- placed a future order for her  Lab Results  Component Value Date   HGBA1C 6.5* 09/28/2014

## 2014-11-23 NOTE — Telephone Encounter (Signed)
Pt states she had called the other day about the METFORMIN she was given makes her sick and she can't take that medicine. Would like to have it changed to something else Please call [phone]     [facility] [address]

## 2015-02-01 ENCOUNTER — Other Ambulatory Visit (INDEPENDENT_AMBULATORY_CARE_PROVIDER_SITE_OTHER): Payer: 59 | Admitting: *Deleted

## 2015-02-01 DIAGNOSIS — E119 Type 2 diabetes mellitus without complications: Secondary | ICD-10-CM | POA: Diagnosis not present

## 2015-02-01 LAB — HEMOGLOBIN A1C: Hgb A1c MFr Bld: 6.1 % — AB (ref 4.0–6.0)

## 2015-02-01 LAB — POCT GLYCOSYLATED HEMOGLOBIN (HGB A1C): Hemoglobin A1C: 6.1

## 2015-02-01 NOTE — Addendum Note (Signed)
Addended by: Kem Boroughs D on: 02/01/2015 08:54 AM   Modules accepted: Orders

## 2015-02-02 ENCOUNTER — Telehealth: Payer: Self-pay | Admitting: Family Medicine

## 2015-02-02 NOTE — Telephone Encounter (Signed)
Received her A1c and gave her a call- LMOM- her A1c looks great.  I am not quite sure if she is taking metformin right now or just using lifestyle but in any case continue what she is doing, recheck in 6 months   Results for orders placed or performed in visit on 02/01/15  POCT glycosylated hemoglobin (Hb A1C)  Result Value Ref Range   Hemoglobin A1C 6.1

## 2015-02-04 ENCOUNTER — Encounter: Payer: Self-pay | Admitting: Family Medicine

## 2015-02-21 ENCOUNTER — Other Ambulatory Visit: Payer: Self-pay | Admitting: Family Medicine

## 2015-03-12 ENCOUNTER — Encounter: Payer: Self-pay | Admitting: Family Medicine

## 2015-03-17 ENCOUNTER — Encounter: Payer: Self-pay | Admitting: Family Medicine

## 2015-03-29 ENCOUNTER — Other Ambulatory Visit: Payer: Self-pay

## 2015-03-29 DIAGNOSIS — K219 Gastro-esophageal reflux disease without esophagitis: Secondary | ICD-10-CM

## 2015-03-29 MED ORDER — RANITIDINE HCL 150 MG PO TABS: 150.0000 mg | ORAL_TABLET | Freq: Two times a day (BID) | ORAL | Status: DC

## 2015-04-25 ENCOUNTER — Other Ambulatory Visit: Payer: Self-pay | Admitting: Family Medicine

## 2015-05-19 ENCOUNTER — Encounter: Payer: Self-pay | Admitting: Certified Nurse Midwife

## 2015-05-19 ENCOUNTER — Ambulatory Visit (INDEPENDENT_AMBULATORY_CARE_PROVIDER_SITE_OTHER): Payer: 59 | Admitting: Certified Nurse Midwife

## 2015-05-19 VITALS — BP 122/72 | HR 70 | Resp 16 | Ht 63.25 in | Wt 136.0 lb

## 2015-05-19 DIAGNOSIS — R350 Frequency of micturition: Secondary | ICD-10-CM | POA: Diagnosis not present

## 2015-05-19 DIAGNOSIS — Z01419 Encounter for gynecological examination (general) (routine) without abnormal findings: Secondary | ICD-10-CM | POA: Diagnosis not present

## 2015-05-19 DIAGNOSIS — R351 Nocturia: Secondary | ICD-10-CM | POA: Diagnosis not present

## 2015-05-19 DIAGNOSIS — Z124 Encounter for screening for malignant neoplasm of cervix: Secondary | ICD-10-CM | POA: Diagnosis not present

## 2015-05-19 NOTE — Progress Notes (Signed)
60 y.o. [id number] Married  African American Fe here for annual exam. Menopausal no HRT. Denies vaginal bleeding or vaginal dryness. Sees PCP yearly for hypertension/Type 2 diabetes, aex/labs, but she has left practice. She will be using a new provider, has not chosen yet. Patient complaining of right back soreness for the past few weeks. Possibly from lifting or sitting. Has tried Motrin prn and helps with use. No pain down legs or severe pain. Also having urinary frequency at night. No hot flashes. Her Hgb A1-C has been elevated at last PCP visit, but she checks glucose at home and has been in 90's . Denies in dysuria, urinary odor or urgency. No other concerns today.   Patient's last menstrual period was 02/21/2008.          Sexually active: Yes.    The current method of family planning is tubal ligation.    Exercising: No.  exercise Smoker:  no  Health Maintenance: Pap:  05-12-13 neg HPV HR neg MMG:  05-21-14 mammo category b density category 2:neg Colonoscopy:  2014 neg per patient BMD:   2012 TDaP:  10/15 Shingles: no Pneumonia: no Hep C and HIV: both neg 8/16 Labs: none Self breast exam: done monthly   reports that she has never smoked. She has never used smokeless tobacco. She reports that she does not drink alcohol or use illicit drugs.  Past Medical History  Diagnosis Date  . Hypertension   . Abnormal Pap smear of cervix 2010    HPV  . Anxiety   . Depression   . Dyspareunia   . Lichen sclerosus   . Hyperlipidemia   . Diabetes mellitus without complication [facility])     Past Surgical History  Procedure Laterality Date  . Colposcopy  2010  . Tubal ligation  1990    Current Outpatient Prescriptions  Medication Sig Dispense Refill  . hydrochlorothiazide (MICROZIDE) 12.5 MG capsule TAKE 1 CAPSULE (12.5 MG TOTAL) BY MOUTH DAILY 90 capsule 0  . ranitidine (ZANTAC) 150 MG tablet TAKE 1 TABLET (150 MG TOTAL) BY MOUTH 2 (TWO) TIMES DAILY. 60 tablet 0  . EPINEPHrine 0.3 mg/0.3 mL  IJ SOAJ injection Inject 0.3 mLs (0.3 mg total) into the muscle once. Use in case of allergic reaction emergency (Patient not taking: Reported on 05/19/2015) 1 Device 2   No current facility-administered medications for this visit.   Facility-Administered Medications Ordered in Other Visits  Medication Dose Route Frequency Provider Last Rate Last Dose  . technetium sestamibi generic (CARDIOLITE) injection 10.1 milli Curie  10.1 milli Curie Intravenous Once PRN Carlena Bjornstad, MD        Family History  Problem Relation Age of Onset  . Hypertension Brother   . Cancer Mother     colon  . Hypertension Father   . Alcohol abuse Father   . Diabetes Father   . Heart disease Maternal Grandmother     CHF  . Cancer Maternal Grandfather     kidney  . Heart disease Paternal Grandmother     CHF  . Breast cancer Paternal Aunt   . Heart disease Paternal Aunt     open heart surgery    ROS:  Pertinent items are noted in HPI.  Otherwise, a comprehensive ROS was negative.  Exam:   BP 122/72 mmHg  Pulse 70  Resp 16  Ht 5' 3.25" (1.607 m)  Wt 136 lb (61.689 kg)  BMI 23.89 kg/m2  LMP 02/21/2008 Height: 5' 3.25" (160.7 cm) Ht Readings from  Last 3 Encounters:  05/19/15 5' 3.25" (1.607 m)  10/13/14 5' 3.5" (1.613 m)  09/28/14 5' 3.5" (1.613 m)    General appearance: alert, cooperative and appears stated age Head: Normocephalic, without obvious abnormality, atraumatic Neck: no adenopathy, supple, symmetrical, trachea midline and thyroid normal to inspection and palpation Lungs: clear to auscultation bilaterally CVAT: negative bilateral, no pain with touching back in general Breasts: normal appearance, no masses or tenderness, No nipple retraction or dimpling, No nipple discharge or bleeding, No axillary or supraclavicular adenopathy Heart: regular rate and rhythm Abdomen: soft, non-tender; no masses,  no organomegaly, negative suprapubic pain Extremities: extremities normal, atraumatic, no  cyanosis or edema Skin: Skin color, texture, turgor normal. No rashes or lesions, warm and dry Lymph nodes: Cervical, supraclavicular, and axillary nodes normal. No abnormal inguinal nodes palpated Neurologic: Grossly normal   Pelvic: External genitalia:  no lesions, normal female              Urethra:  normal appearing urethra with no masses, tenderness or lesions   Bladder: non tender   Urethral meatus: non tender, no redness              Bartholin's and Skene's: normal                 Vagina: normal appearing vagina with normal color and discharge, no lesions, no atrophy              Cervix: no cervical motion tenderness, no lesions and normal appearance              Pap taken: Yes.   Bimanual Exam:  Uterus:  normal size, contour, position, consistency, mobility, non-tender              Adnexa: normal adnexa and no mass, fullness, tenderness               Rectovaginal: Confirms               Anus:  normal sphincter tone, no lesions  Chaperone present: yes  A:  Well Woman with normal exam  Menopausal no HRT  Lower right back soreness  Nocturia, R/O asymptomatic bacturia vs Diabetes related  Type 2 Diabetes/hypertension with PCP managment  P:   Reviewed health and wellness pertinent to exam  Aware of need to evaluate if vaginal bleeding  Discussed muscle soreness with lifting and daily activity. Encouraged to do stretching and can do ice to area or heat. If pain increasing and no change will need to see PCP.  Discussed urinary frequency can be related to drinking increase caffeine( she does not use) water or fluids prior to bedtime, also related to elevated glucose levels and can be related to infection.. Warning signs of UTI given. Encouraged to check glucose at hs and also limit fluids after 6 pm to see if this helps. If urine culture is negative needs to advise if continues.  [id number] culture  Continue follow up with PCP as indicated  Pap smear as above with HPVHR   counseled  on breast self exam, mammography screening, adequate intake of calcium and vitamin D, diet and exercise  return annually or prn  An After Visit Summary was printed and given to the patient.

## 2015-05-19 NOTE — Progress Notes (Signed)
Encounter reviewed Jill Jertson, MD   

## 2015-05-19 NOTE — Patient Instructions (Signed)

## 2015-05-21 LAB — URINE CULTURE
Colony Count: NO GROWTH
Organism ID, Bacteria: NO GROWTH

## 2015-05-24 ENCOUNTER — Other Ambulatory Visit: Payer: Self-pay

## 2015-05-24 LAB — IPS PAP TEST WITH HPV

## 2015-05-24 MED ORDER — HYDROCHLOROTHIAZIDE 12.5 MG PO CAPS: ORAL_CAPSULE | ORAL | Status: DC

## 2015-05-25 NOTE — Addendum Note (Signed)
Addended by: Regina Eck on: 05/25/2015 08:00 AM   Modules accepted: Orders, SmartSet

## 2015-05-26 ENCOUNTER — Telehealth: Payer: Self-pay

## 2015-05-26 LAB — IPS HPV GENOTYPING 16/18

## 2015-05-26 NOTE — Telephone Encounter (Signed)
Lmtcb. See pap result

## 2015-05-26 NOTE — Telephone Encounter (Signed)
-----   Message from Regina Eck, CNM sent at 05/26/2015 12:20 PM EDT ----- Addendum no further evaluation needed.

## 2015-05-27 NOTE — Telephone Encounter (Signed)
Patient notified of results. See lab 

## 2015-05-28 ENCOUNTER — Telehealth: Payer: Self-pay | Admitting: Certified Nurse Midwife

## 2015-05-28 MED ORDER — METRONIDAZOLE 0.75 % VA GEL: 1.0000 | Freq: Every day | VAGINAL | Status: DC

## 2015-05-28 NOTE — Telephone Encounter (Signed)
Spoke with patient. Patient states that she woke up this morning and in addition to having vaginal itching she is now experiencing vaginal discharge. She is requesting rx be sent to pharmacy on file. Rx for Metrogel place 1 applicator at bedtime x 5 days sent to pharmacy on file. She is agreeable.  Routing to provider for final review. Patient agreeable to disposition. Will close encounter.

## 2015-05-28 NOTE — Telephone Encounter (Signed)
Notes Recorded by Susy Manor, CMA on 05/27/2015 at 2:35 PM Patient notified of results as written by provider. Pt states no symptoms of BV. Pt only has occasional external vaginal itching & she uses a little bit of the cream that was prescribed to her & it takes care of that. Pt to call within one week if she notices any symtoms of BV. ------  Notes Recorded by Susy Manor, CMA on 05/26/2015 at 2:26 PM Left message for patient to callback 08 recall in for 32yr ------  Notes Recorded by Regina Eck, CNM on 05/26/2015 at 12:20 PM Addendum no further evaluation needed. ------  Notes Recorded by Regina Eck, CNM on 05/26/2015 at 12:19 PM Notify patient that pap smear is normal but HPVHR was positive, but genotype 16,18 were negative, so further evaluation at this time. Needs repeat pap smear in one year. Pap recall 08 Pap also showed BV, will need Metrogel if symptomatic please advise

## 2015-05-28 NOTE — Telephone Encounter (Signed)
Patient called requesting to speak with Joy to follow up from a call yesterday about results. She said, "Joy offered my a prescription for an infection but I declined. I have changed my mind and would like a prescription called in please."  Pharmacy on file is correct.

## 2015-05-28 NOTE — Telephone Encounter (Signed)
Peitn infection

## 2015-06-23 ENCOUNTER — Other Ambulatory Visit: Payer: Self-pay | Admitting: Urgent Care

## 2015-07-19 ENCOUNTER — Other Ambulatory Visit: Payer: Self-pay | Admitting: Urgent Care

## 2015-07-31 ENCOUNTER — Other Ambulatory Visit: Payer: Self-pay | Admitting: Urgent Care

## 2015-08-02 NOTE — Telephone Encounter (Signed)
Spoke to pt and she is going to another practice now that Dr Lorelei Pont has left. She did not update her pharm.

## 2015-08-03 ENCOUNTER — Ambulatory Visit (INDEPENDENT_AMBULATORY_CARE_PROVIDER_SITE_OTHER): Payer: 59 | Admitting: Internal Medicine

## 2015-08-03 VITALS — BP 126/80 | HR 65 | Temp 97.4°F | Resp 17 | Ht 64.0 in | Wt 135.0 lb

## 2015-08-03 DIAGNOSIS — H8112 Benign paroxysmal vertigo, left ear: Secondary | ICD-10-CM

## 2015-08-03 MED ORDER — MECLIZINE HCL 25 MG PO TABS: 25.0000 mg | ORAL_TABLET | Freq: Three times a day (TID) | ORAL | Status: DC | PRN

## 2015-08-03 NOTE — Patient Instructions (Signed)
     IF you received an x-ray today, you will receive an invoice from [facility]. Please contact [facility] at [phone] with questions or concerns regarding your invoice.   IF you received labwork today, you will receive an invoice from [facility]. Please contact [facility] at [phone] with questions or concerns regarding your invoice.   Our billing staff will not be able to assist you with questions regarding bills from these companies.  You will be contacted with the lab results as soon as they are available. The fastest way to get your results is to activate your My Chart account. Instructions are located on the last page of this paperwork. If you have not heard from us regarding the results in 2 weeks, please contact this office.     We recommend that you schedule a mammogram for breast cancer screening. Typically, you do not need a referral to do this. Please contact a local imaging center to schedule your mammogram.  [facility] - [phone]  *ask for the Radiology Department [facility]) - [phone] or [phone]  [facility] - [phone] [facility] - [phone] [facility] - [phone]  *ask for the Radiology Department [facility] - [phone]  *ask for the Radiology Department [facility] - [phone]  *ask for the Mammography Department [facility] - [phone]  

## 2015-08-03 NOTE — Progress Notes (Addendum)
Subjective:    Patient ID: Nancy Chandler Chandler, female    DOB: [birth year], 60 y.o.   MRN: [id number] By signing my name below, I, Nancy Chandler Chandler, attest that this documentation has been prepared under the direction and in the presence of Tami Lin, MD. Electronically Signed: [name], ER Scribe. 08/03/2015. 12:25 PM.  Chief Complaint  Patient presents with  . Dizziness  . Headache    HPI HPI Comments: Nancy Chandler Chandler is a 60 y.o. female who presents to [facility] complaining of dizziness and frontal HA that started after she went to work this morning. She denies blurred vision, cough, ringing in ears, breathing issues or current nausea. She states she felt bad yesterday with sonYesterday she felt nauseous.    Past Medical History  Diagnosis Date  . Hypertension   . Abnormal Pap smear of cervix 2010    HPV  . Anxiety   . Depression   . Dyspareunia   . Lichen sclerosus   . Hyperlipidemia   . Diabetes mellitus without complication (HCC)    Allergies  Allergen Reactions  . Hydrocodone Nausea And Vomiting   Past Medical History  Diagnosis Date  . Hypertension   . Abnormal Pap smear of cervix 2010    HPV  . Anxiety   . Depression   . Dyspareunia   . Lichen sclerosus   . Hyperlipidemia   . Diabetes mellitus without complication ([place])     Review of Systems  Constitutional: Negative for fever and chills.  HENT: Positive for sinus pressure. Negative for rhinorrhea.   Gastrointestinal: Positive for nausea. Negative for vomiting.      Objective:  BP 126/80 mmHg  Pulse 65  Temp(Src) 97.4 F (36.3 C) (Oral)  Resp 17  Ht 5\' 4"  (1.626 m)  Wt 135 lb (61.236 kg)  BMI 23.16 kg/m2  SpO2 100%  LMP 02/21/2008  Physical Exam  Constitutional: She is oriented to person, place, and time. She appears well-developed and well-nourished. No distress.  HENT:  Head: Normocephalic and atraumatic.  Right Ear: External ear normal.  Left Ear: External ear normal.  Nose: Nose  normal.  Mouth/Throat: Oropharynx is clear and moist.  Eyes: Conjunctivae and EOM are normal. Pupils are equal, round, and reactive to light.  Neck: Neck supple. No thyromegaly present.  Cardiovascular: Normal rate, regular rhythm, normal heart sounds and intact distal pulses.   No murmur heard. Pulmonary/Chest: Effort normal and breath sounds normal. No respiratory distress.  Musculoskeletal: Normal range of motion. She exhibits no edema.  Lymphadenopathy:    She has no cervical adenopathy.  Neurological: She is alert and oriented to person, place, and time. She has normal reflexes. No cranial nerve deficit. She exhibits normal muscle tone. Coordination normal.  Skin: Skin is warm and dry. She is not diaphoretic.  Psychiatric: She has a normal mood and affect. Her behavior is normal. Judgment and thought content normal.  Nursing note and vitals reviewed. can't produce nystagmus with exam but can produce dizzy spells with roll to L supine    Assessment & Plan:  BPV (benign positional vertigo), left  Meds ordered this encounter  Medications  . meclizine (ANTIVERT) 25 MG tablet    Sig: Take 1 tablet (25 mg total) by mouth 3 (three) times daily as needed for dizziness.    Dispense:  30 tablet    Refill:  0   Taught brandt daroff manuver Recheck 1 week if not well   I have completed the patient encounter in  its entirety as documented by the scribe, with editing by me where necessary. Stamatia Masri P. Laney Pastor, M.D.

## 2015-11-02 ENCOUNTER — Ambulatory Visit: Payer: 59 | Attending: Orthopaedic Surgery | Admitting: Physical Therapy

## 2015-11-02 ENCOUNTER — Encounter: Payer: Self-pay | Admitting: Physical Therapy

## 2015-11-02 DIAGNOSIS — M25512 Pain in left shoulder: Secondary | ICD-10-CM | POA: Diagnosis not present

## 2015-11-02 DIAGNOSIS — M25612 Stiffness of left shoulder, not elsewhere classified: Secondary | ICD-10-CM | POA: Insufficient documentation

## 2015-11-02 DIAGNOSIS — M542 Cervicalgia: Secondary | ICD-10-CM | POA: Diagnosis present

## 2015-11-02 DIAGNOSIS — M6281 Muscle weakness (generalized): Secondary | ICD-10-CM | POA: Diagnosis present

## 2015-11-03 NOTE — Therapy (Signed)
[place], [place], [address] Phone: [phone]   Fax:  [phone]  Physical Therapy Evaluation  Patient Details  Name: Nancy Chandler MRN: [id number] Date of Birth: Dec 16, 1955 Referring Provider: Dr Jean Rosenthal   Encounter Date: 11/02/2015      PT End of Session - 11/03/15 0928    Visit Number 1   Number of Visits 16   Date for PT Re-Evaluation 12/28/15   PT Start Time 0430   PT Stop Time 0527   PT Time Calculation (min) 57 min   Activity Tolerance Patient tolerated treatment well   Behavior During Therapy [facility] for tasks assessed/performed      Past Medical History:  Diagnosis Date  . Abnormal Pap smear of cervix 2010   HPV  . Anxiety   . Depression   . Diabetes mellitus without complication ([place])   . Dyspareunia   . Hyperlipidemia   . Hypertension   . Lichen sclerosus     Past Surgical History:  Procedure Laterality Date  . COLPOSCOPY  2010  . TUBAL LIGATION  1990    There were no vitals filed for this visit.       Subjective Assessment - 11/03/15 0923    Subjective Patient reports over the past 2 months she has developed significant left shoulder pain. She reports the pain had reached about a 10/10. At this point the pain is improving. She had shooting pain from her trap area and into her left elbow. The pain increases with activity. When the pain is bad she has difficulty using her left upper extremity.    Pertinent History DMII   Limitations Lifting;House hold activities   How long can you sit comfortably? N/A   How long can you stand comfortably? N/A    How long can you walk comfortably? 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 Yes   Pain Score 5    Pain Location Shoulder   Pain Orientation Left   Pain Descriptors / Indicators Aching;Sharp   Pain Type Acute  pain   Pain Onset More than a month ago   Pain Frequency Rarely   Aggravating Factors  using the shoulder    Effect of Pain on Daily Activities Patient has been avoiding using her left shoulder    Multiple Pain Sites No            [facility] PT Assessment - 11/02/15 2026      Assessment   Medical Diagnosis Left shoulder pain    Referring Provider Dr Jean Rosenthal    Onset Date/Surgical Date --  2 months prior    Hand Dominance Right   Next MD Visit after she has had therapy for a few weeks    Prior Therapy Physical therapy for the left shoulder 3 years prior      Precautions   Precautions None     Restrictions   Weight Bearing Restrictions No     Balance Screen   Has the patient fallen in the past 6 months No     [place] residence   Living Arrangements Spouse/significant other     Prior Function   Level of Independence Independent     Cognition   Overall Cognitive Status Within Functional Limits for tasks assessed     Observation/Other Assessments   Focus on Therapeutic Outcomes (  FOTO)  39% limitation      Sensation   Additional Comments occasional numbness into left hand. Per patient her whole hand goes numb. Also can have radiating pain from the lateral tip of her wshoulder that radiates into her elbow.      Coordination   Gross Motor Movements are Fluid and Coordinated Yes     Posture/Postural Control   Posture Comments Left shoulder elevaltion; rounded shoulders; forward head;      AROM   Overall AROM Comments left shoulder flexion 160 degrees with pain; abduction 138 degrees with pain; functional external rotation behind the head with pain; right shoulder WNL    AROM Assessment Site Shoulder   Right/Left Shoulder Right;Left   Cervical Flexion 45 degrees    Cervical Extension 38 degrees    Cervical - Right Side Bend WNL   Cervical - Left Side Bend WNL    Cervical - Right Rotation 65 degrees    Cervical - Left  Rotation 60 degrees      PROM   Right/Left Shoulder Left   Left Shoulder Flexion 165 Degrees  pain at end range    Left Shoulder Internal Rotation 80 Degrees  pain at end range    Left Shoulder External Rotation 70 Degrees  with pain      Strength   Strength Assessment Site Shoulder   Right/Left Shoulder Left   Left Shoulder Flexion 4/5   Left Shoulder Extension 5/5   Left Shoulder ABduction 4/5   Left Shoulder Internal Rotation 4+/5   Left Shoulder External Rotation 5/5     Palpation   Palpation comment tenderness to palpation in the left upper trap and int the left cervical paraspinals; tenderness to palpation in the anterior shoulder.      Special Tests    Special Tests --  (+) speeds test (+) Hawkins test (-) empty can test                    [facility] Adult PT Treatment/Exercise - 11/02/15 2026      Shoulder Exercises: Supine   Other Supine Exercises wand flexion 2x10; wand external rotation 2x10; trigger point release with tennis ball.      Modalities   Modalities Moist Heat     Moist Heat Therapy   Number Minutes Moist Heat 10 Minutes   Moist Heat Location Cervical  seated with pillow      Manual Therapy   Manual therapy comments Grade II and II mobilizations PA and inferiofr glides to improve pain free motion into flexion and external rotation of the shoulder.. Sub occipital release and upper trap treigger point release to reduca muscle tension.                 PT Education - 11/03/15 0926    Education provided Yes   Education Details HEP, activity progression and symptom mangement    Person(s) Educated Patient   Methods Explanation   Comprehension Verbalized understanding;Returned demonstration          PT Short Term Goals - 11/03/15 1304      PT SHORT TERM GOAL #1   Title Patient will report 2/10 pain at worst in her left shoulder    Time 4   Period Weeks   Status New     PT SHORT TERM GOAL #2   Title Patient will report  centralized pain in her lL shoulder   Time 4   Period Weeks   Status New  PT SHORT TERM GOAL #3   Title Patient will be independent with initial HEP    Time 4   Period Weeks   Status New     PT SHORT TERM GOAL #4   Title Patient will increase gross L upper extremity strength to 4+/10    Time 4   Period Weeks   Status New     PT SHORT TERM GOAL #5   Title Patient will demsotrate full pain free left shoulder ROM    Time 4   Period Weeks   Status New           PT Long Term Goals - 11/03/15 1305      PT LONG TERM GOAL #1   Title Patient will reach up into a cabinent and grab a 2lb object without pain    Time 8   Period Weeks   Status New     PT LONG TERM GOAL #2   Title Patient will perfrom work tasks without increased pain    Time 8   Period Weeks   Status New     PT LONG TERM GOAL #3   Title Patient will return to the gym with a program to improve strength of shoulder and postural exercises.    Time 8   Period Weeks   Status New               Plan - 11/03/15 1253    Clinical Impression Statement Patient is a 60 year old female with left shoulder pain that radiates at times into her left elbow. She presents with decreased strength and postural deficits. She has limited cervical ROM as well  as spasming in her upper trap and cervical paraspianls. Signs and objective measures are consistent with left shoulder impingement. Given her e-ray from 2014 and her radicualr symtoms she likely has cervical spine involvement as will. She would benefit from skilled therapy to address the above deficits. She was seen for a low complexity evaluation. She also has limited pain free shoulder ROM     Rehab Potential Good   PT Frequency 2x / week   PT Duration 8 weeks   PT Treatment/Interventions ADLs/Self Care Home Management;Cryotherapy;Electrical Stimulation;Iontophoresis 4mg /ml Dexamethasone;Moist Heat;Ultrasound;Therapeutic activities;Therapeutic exercise;Neuromuscular  re-education;Patient/family education;Energy conservation;Passive range of motion;Manual techniques;Taping;Dry needling   PT Next Visit Plan continue with manual therapy for end range shoulder ROM and cervical ROM and spasming. Assess radicular signs. Add scapular strengthening exercises. Consider RTC strengthening if able. Consider modalites such as ultraspund or Ionto.    PT Home Exercise Plan wand flexion, wand external rotation    Consulted and Agree with Plan of Care Patient      Patient will benefit from skilled therapeutic intervention in order to improve the following deficits and impairments:  Decreased activity tolerance, Decreased strength, Decreased mobility, Postural dysfunction, Impaired UE functional use, Increased muscle spasms, Decreased range of motion  Visit Diagnosis: Pain in left shoulder - Plan: PT plan of care cert/re-cert  Stiffness of left shoulder, not elsewhere classified - Plan: PT plan of care cert/re-cert  Cervicalgia - Plan: PT plan of care cert/re-cert  Muscle weakness (generalized) - Plan: PT plan of care cert/re-cert     Problem List Patient Active Problem List   Diagnosis Date Noted  . Chest pain 10/13/2014  . GERD (gastroesophageal reflux disease) 10/13/2014  . Diabetes mellitus type 2, controlled ([place]) 09/29/2014  . Hyperlipidemia 12/08/2013  . Lichen sclerosus et atrophicus of the vulva 06/09/2013  Class: Diagnosis of  . Essential hypertension 05/12/2013    Class: History of    Carney Living  PT DPT  11/03/2015, 1:14 PM  [facility] [address] [place], [place], [address] Phone: [phone]   Fax:  [phone]  Name: Nancy Chandler MRN: [id number] Date of Birth: 1955-06-12

## 2015-11-10 ENCOUNTER — Ambulatory Visit: Payer: 59 | Admitting: Physical Therapy

## 2015-11-10 DIAGNOSIS — M25512 Pain in left shoulder: Secondary | ICD-10-CM

## 2015-11-10 DIAGNOSIS — M6281 Muscle weakness (generalized): Secondary | ICD-10-CM

## 2015-11-10 DIAGNOSIS — M542 Cervicalgia: Secondary | ICD-10-CM

## 2015-11-10 DIAGNOSIS — M25612 Stiffness of left shoulder, not elsewhere classified: Secondary | ICD-10-CM

## 2015-11-10 NOTE — Therapy (Signed)
[place], [place], [address] Phone: [phone]   Fax:  [phone]  Physical Therapy Treatment  Patient Details  Name: Nancy Chandler MRN: [id number] Date of Birth: [birth year] Referring Provider: Dr Jean Rosenthal   Encounter Date: 11/10/2015      PT End of Session - 11/10/15 2132    Visit Number 2   Number of Visits 16   Date for PT Re-Evaluation 12/28/15   PT Start Time 1330   PT Stop Time 1423   PT Time Calculation (min) 53 min   Activity Tolerance Patient tolerated treatment well   Behavior During Therapy [facility] for tasks assessed/performed      Past Medical History:  Diagnosis Date  . Abnormal Pap smear of cervix 2010   HPV  . Anxiety   . Depression   . Diabetes mellitus without complication ([place])   . Dyspareunia   . Hyperlipidemia   . Hypertension   . Lichen sclerosus     Past Surgical History:  Procedure Laterality Date  . COLPOSCOPY  2010  . TUBAL LIGATION  1990    There were no vitals filed for this visit.      Subjective Assessment - 11/10/15 1332    Subjective Patient reports she flet good after the initial eval. She had some minor soreness but it went away. She is not having any pain today.    Pertinent History DMII   Limitations Lifting;House hold activities   How long can you sit comfortably? N/A   How long can you stand comfortably? N/A    How long can you walk comfortably? 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 No/denies                         [facility] Adult PT Treatment/Exercise - 11/10/15 0001      Shoulder Exercises: Supine   Other Supine Exercises supine flexionm 1lb x10 supine d2 flexion 1lb 2x10      Shoulder Exercises: Prone   Retraction Limitations x10    Extension Limitations x10    Internal Rotation Limitations x10 red     Other Prone Exercises prone Y x10 horrizontal abducation x10      Shoulder Exercises: Standing   External Rotation Limitations red x10    Internal Rotation Limitations red x10    Extension Limitations red x10    Row Limitations red x10      Modalities   Modalities Moist Heat     Moist Heat Therapy   Number Minutes Moist Heat 10 Minutes   Moist Heat Location Cervical  seated with pillow      Manual Therapy   Manual therapy comments Grade II and II mobilizations PA and inferiofr glides to improve pain free motion into flexion and external rotation of the shoulder.. Sub occipital release and upper trap treigger point release to reduca muscle tension.                 PT Education - 11/10/15 2131    Education provided Yes   Education Details added exercises to HEP. Patient educatedon activity progression because she is not sure if she will come back 2nd to high co-pay.    Person(s) Educated Patient   Methods Explanation;Verbal cues;Tactile cues;Handout   Comprehension Verbalized understanding;Verbal cues required;Tactile cues required  PT Short Term Goals - 11/10/15 2136      PT SHORT TERM GOAL #1   Title Patient will report 2/10 pain at worst in her left shoulder    Baseline No pain assess carryover    Time 4   Period Weeks   Status On-going     PT SHORT TERM GOAL #2   Title Patient will report centralized pain in her lL shoulder   Baseline No radicular pain    Time 4   Period Weeks   Status On-going     PT SHORT TERM GOAL #3   Title Patient will be independent with initial HEP    Baseline given iitial HEP with education    Time 4   Period Weeks   Status On-going     PT SHORT TERM GOAL #4   Title Patient will increase gross L upper extremity strength to 4+/10    Time 4   Period Weeks   Status On-going     PT SHORT TERM GOAL #5   Title Patient will demsotrate full pain free left shoulder ROM    Time 4   Period Weeks   Status On-going            PT Long Term Goals - 11/03/15 1305      PT LONG TERM GOAL #1   Title Patient will reach up into a cabinent and grab a 2lb object without pain    Time 8   Period Weeks   Status New     PT LONG TERM GOAL #2   Title Patient will perfrom work tasks without increased pain    Time 8   Period Weeks   Status New     PT LONG TERM GOAL #3   Title Patient will return to the gym with a program to improve strength of shoulder and postural exercises.    Time 8   Period Weeks   Status New               Plan - 11/10/15 2133    Clinical Impression Statement Patient dmeostrated significant improvements in pain and shoulder ROM. She had pain free shoulder ROM. The patient does not belive she can come back for many more visits 2nd to her co-pay. 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 She was given a complete HEP with exercise progression education. If the patien has pain after exercises she will come back for an update and manaual work. If sahe does not she will practice her HEP on her own..    Rehab Potential Good   PT Frequency 2x / week   PT Duration 8 weeks   PT Treatment/Interventions ADLs/Self Care Home Management;Cryotherapy;Electrical Stimulation;Iontophoresis 4mg /ml Dexamethasone;Moist Heat;Ultrasound;Therapeutic activities;Therapeutic exercise;Neuromuscular re-education;Patient/family education;Energy conservation;Passive range of motion;Manual techniques;Taping;Dry needling   PT Next Visit Plan continue with manual therapy for end range shoulder ROM and cervical ROM and spasming. Assess radicular signs. Add scapular strengthening exercises. Consider RTC strengthening if able. Consider modalites such as ultraspund or Ionto.    PT Home Exercise Plan wand flexion, wand external rotation see oatient insturctions       Patient will benefit from skilled therapeutic intervention in order to improve the following deficits and impairments:   Decreased activity tolerance, Decreased strength, Decreased mobility, Postural dysfunction, Impaired UE functional use, Increased muscle spasms, Decreased range of motion  Visit Diagnosis: Pain in left shoulder  Stiffness of left shoulder, not elsewhere classified  Cervicalgia  Muscle weakness (generalized)     Problem List Patient Active Problem List   Diagnosis Date Noted  . Chest pain 10/13/2014  . GERD (gastroesophageal reflux disease) 10/13/2014  . Diabetes mellitus type 2, controlled ([place]) 09/29/2014  . Hyperlipidemia 12/08/2013  . Lichen sclerosus et atrophicus of the vulva 06/09/2013    Class: Diagnosis of  . Essential hypertension 05/12/2013    Class: History of    Carney Living PT DPT  11/10/2015, 9:41 PM  [facility] [address] [place], [place], [address] Phone: [phone]   Fax:  [phone]  Name: Nancy Chandler MRN: [id number] Date of Birth: September 08, 1955

## 2015-11-16 ENCOUNTER — Ambulatory Visit: Payer: 59 | Admitting: Physical Therapy

## 2015-11-18 ENCOUNTER — Ambulatory Visit: Payer: 59 | Admitting: Physical Therapy

## 2015-11-23 ENCOUNTER — Encounter: Payer: 59 | Admitting: Physical Therapy

## 2015-11-25 ENCOUNTER — Encounter: Payer: 59 | Admitting: Physical Therapy

## 2015-11-30 ENCOUNTER — Ambulatory Visit: Payer: 59 | Attending: Orthopaedic Surgery | Admitting: Physical Therapy

## 2015-11-30 DIAGNOSIS — M25512 Pain in left shoulder: Secondary | ICD-10-CM | POA: Diagnosis not present

## 2015-11-30 DIAGNOSIS — M542 Cervicalgia: Secondary | ICD-10-CM | POA: Diagnosis present

## 2015-11-30 DIAGNOSIS — M25612 Stiffness of left shoulder, not elsewhere classified: Secondary | ICD-10-CM | POA: Diagnosis present

## 2015-11-30 DIAGNOSIS — M6281 Muscle weakness (generalized): Secondary | ICD-10-CM

## 2015-12-01 NOTE — Therapy (Signed)
[place], [place], [address] Phone: [phone]   Fax:  [phone]  Physical Therapy Treatment  Patient Details  Name: Nancy Chandler MRN: [id number] Date of Birth: [birth year] Referring Provider: Dr Jean Rosenthal   Encounter Date: 11/30/2015      PT End of Session - 11/30/15 1904    Visit Number 3   Number of Visits 16   Date for PT Re-Evaluation 12/28/15   PT Start Time 1423   PT Stop Time 1504   PT Time Calculation (min) 41 min   Activity Tolerance Patient tolerated treatment well   Behavior During Therapy [facility] for tasks assessed/performed      Past Medical History:  Diagnosis Date  . Abnormal Pap smear of cervix 2010   HPV  . Anxiety   . Depression   . Diabetes mellitus without complication ([place])   . Dyspareunia   . Hyperlipidemia   . Hypertension   . Lichen sclerosus     Past Surgical History:  Procedure Laterality Date  . COLPOSCOPY  2010  . TUBAL LIGATION  1990    There were no vitals filed for this visit.      Subjective Assessment - 11/30/15 1859    Subjective Patient was doing well. She flet like she was doing well enough to proceed on her own. Two days ago she had increased pain in her shoulder after lifting a few gallons of bleach. It has improved today but she would like to do some more therapy.    Pertinent History DMII   Limitations Lifting;House hold activities   How long can you sit comfortably? N/A   How long can you stand comfortably? N/A    How long can you walk comfortably? 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 Yes   Pain Score 3    Pain Location Shoulder   Pain Orientation Left   Pain Descriptors / Indicators Aching;Sharp   Pain Type Acute pain   Pain Onset More than a month ago   Pain Frequency Rarely   Aggravating Factors  using the  shoulder    Effect of Pain on Daily Activities Patient has avoided using her shoulder                          [facility] Adult PT Treatment/Exercise - 12/01/15 0001      Shoulder Exercises: Supine   Other Supine Exercises supine flexion 2x10; D2 flexion 2x10      Shoulder Exercises: Prone   Retraction Limitations 2lb 2x10    Extension Limitations 2lb 2x10      Shoulder Exercises: Standing   External Rotation Limitations red 2x10    Internal Rotation Limitations red 2x10    Extension Limitations red 2x10    Row Limitations red 2x10      Modalities   Modalities Moist Heat     Moist Heat Therapy   Moist Heat Location Cervical  seated with pillow      Manual Therapy   Manual therapy comments Grade II and II mobilizations PA and inferior glides to improve pain free motion into flexion and external rotation of the shoulder.. Sub occipital release and upper trap treigger point release to reduca muscle tension.                 PT Education - 11/30/15  1902    Education provided Yes   Education Details added rotator cuff exercises    Person(s) Educated Patient   Methods Explanation;Verbal cues;Handout   Comprehension Verbalized understanding;Returned demonstration;Tactile cues required          PT Short Term Goals - 11/30/15 1908      PT SHORT TERM GOAL #1   Title Patient will report 2/10 pain at worst in her left shoulder    Baseline No pain assess carryover    Time 4   Period Weeks   Status On-going     PT SHORT TERM GOAL #2   Title Patient will report centralized pain in her lL shoulder   Baseline No radicular pain    Time 4   Period Weeks   Status On-going     PT SHORT TERM GOAL #3   Title Patient will be independent with initial HEP    Baseline demsotrated initial HEP with minimal cuing    Time 4   Period Weeks   Status On-going     PT SHORT TERM GOAL #4   Title Patient will increase gross L upper extremity strength to 4+/10    Time 4    Period Weeks   Status On-going     PT SHORT TERM GOAL #5   Title Patient will demsotrate full pain free left shoulder ROM    Time 4   Period Weeks   Status On-going           PT Long Term Goals - 11/03/15 1305      PT LONG TERM GOAL #1   Title Patient will reach up into a cabinent and grab a 2lb object without pain    Time 8   Period Weeks   Status New     PT LONG TERM GOAL #2   Title Patient will perfrom work tasks without increased pain    Time 8   Period Weeks   Status New     PT LONG TERM GOAL #3   Title Patient will return to the gym with a program to improve strength of shoulder and postural exercises.    Time 8   Period Weeks   Status New               Plan - 11/30/15 1906    Clinical Impression Statement Despite pain over the weekedn the patient still appears to be making good progress. She had no pain with treatment. She has been doing her exercises at home. She will be seen on Thursday. If she is doing well she may be discharged.    Rehab Potential Good   PT Frequency 2x / week   PT Duration 8 weeks   PT Treatment/Interventions ADLs/Self Care Home Management;Cryotherapy;Electrical Stimulation;Iontophoresis 4mg /ml Dexamethasone;Moist Heat;Ultrasound;Therapeutic activities;Therapeutic exercise;Neuromuscular re-education;Patient/family education;Energy conservation;Passive range of motion;Manual techniques;Taping;Dry needling   PT Next Visit Plan continue with manual therapy for end range shoulder ROM and cervical ROM and spasming. Assess radicular signs. Add scapular strengthening exercises. Consider RTC strengthening if able. Consider modalites such as ultraspund or Ionto.    PT Home Exercise Plan wand flexion, wand external rotation see oatient insturctions    Consulted and Agree with Plan of Care Patient      Patient will benefit from skilled therapeutic intervention in order to improve the following deficits and impairments:  Decreased activity  tolerance, Decreased strength, Decreased mobility, Postural dysfunction, Impaired UE functional use, Increased muscle spasms, Decreased range of motion  Visit Diagnosis: Acute pain  of left shoulder  Stiffness of left shoulder, not elsewhere classified  Cervicalgia  Muscle weakness (generalized)     Problem List Patient Active Problem List   Diagnosis Date Noted  . Chest pain 10/13/2014  . GERD (gastroesophageal reflux disease) 10/13/2014  . Diabetes mellitus type 2, controlled ([place]) 09/29/2014  . Hyperlipidemia 12/08/2013  . Lichen sclerosus et atrophicus of the vulva 06/09/2013    Class: Diagnosis of  . Essential hypertension 05/12/2013    Class: History of    Carney Living PT DPT  12/01/2015, 1:07 PM  [facility] [address] [place], [place], [address] Phone: [phone]   Fax:  [phone]  Name: Nancy Chandler MRN: [id number] Date of Birth: 14-Jun-1955

## 2015-12-02 ENCOUNTER — Ambulatory Visit: Payer: 59 | Admitting: Physical Therapy

## 2015-12-13 ENCOUNTER — Ambulatory Visit: Payer: 59 | Admitting: Physical Therapy

## 2015-12-13 DIAGNOSIS — M25512 Pain in left shoulder: Secondary | ICD-10-CM

## 2015-12-13 DIAGNOSIS — M542 Cervicalgia: Secondary | ICD-10-CM

## 2015-12-13 DIAGNOSIS — M6281 Muscle weakness (generalized): Secondary | ICD-10-CM

## 2015-12-13 DIAGNOSIS — M25612 Stiffness of left shoulder, not elsewhere classified: Secondary | ICD-10-CM

## 2015-12-14 NOTE — Therapy (Signed)
[place], [place], [address] Phone: [phone]   Fax:  [phone]  Physical Therapy Treatment  Patient Details  Name: Nancy Chandler MRN: [id number] Date of Birth: Oct 29, 1955 Referring Provider: Dr Jean Rosenthal   Encounter Date: 12/13/2015      PT End of Session - 12/14/15 0752    Visit Number 4   Number of Visits 16   Date for PT Re-Evaluation 12/28/15   PT Start Time 9833   PT Stop Time 1458   PT Time Calculation (min) 43 min   Activity Tolerance Patient tolerated treatment well   Behavior During Therapy [facility] for tasks assessed/performed      Past Medical History:  Diagnosis Date  . Abnormal Pap smear of cervix 2010   HPV  . Anxiety   . Depression   . Diabetes mellitus without complication ([place])   . Dyspareunia   . Hyperlipidemia   . Hypertension   . Lichen sclerosus     Past Surgical History:  Procedure Laterality Date  . COLPOSCOPY  2010  . TUBAL LIGATION  1990    There were no vitals filed for this visit.      Subjective Assessment - 12/13/15 1632    Subjective Patient continues to have very little pain in her shoulder. She has had very little pain with reaching.    Pertinent History DMII   Limitations Lifting;House hold activities   How long can you sit comfortably? N/A   How long can you stand comfortably? N/A    How long can you walk comfortably? 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 Yes   Pain Score 3    Pain Location Shoulder   Pain Orientation Left   Pain Descriptors / Indicators Aching;Sharp   Pain Type Acute pain   Pain Onset More than a month ago   Pain Frequency Rarely   Aggravating Factors  usingl the shoulder    Effect of Pain on Daily Activities Patient avoided using her shoulder             [facility] PT Assessment - 12/14/15 0001      Observation/Other Assessments   Focus on Therapeutic Outcomes (FOTO)  36% limitation      Sensation   Additional Comments No numbness      PROM   Left Shoulder Flexion 172 Degrees   Left Shoulder Internal Rotation 80 Degrees     Strength   Left Shoulder Flexion 5/5   Left Shoulder Extension 5/5   Left Shoulder ABduction 5/5   Left Shoulder Internal Rotation 5/5   Left Shoulder External Rotation 5/5     Palpation   Palpation comment No tenderness in the trap                      [facility] Adult PT Treatment/Exercise - 12/14/15 0001      Neck Exercises: Machines for Strengthening   Cybex Row 2x10 15 lbs      Shoulder Exercises: Supine   Other Supine Exercises supine flexion 2x10; D2 flexion 2x10      Shoulder Exercises: Prone   Retraction Limitations 2lb 2x10    Extension Limitations 2lb 2x10      Shoulder Exercises: Standing   External Rotation Limitations red 2x10    Internal Rotation Limitations red 2x10    Extension Limitations red 2x10  Row Limitations red 2x10      Modalities   Modalities Moist Heat     Moist Heat Therapy   Moist Heat Location Cervical  seated with pillow      Manual Therapy   Manual therapy comments Grade II and II mobilizations PA and inferior glides to improve pain free motion into flexion and external rotation of the shoulder.. Sub occipital release and upper trap treigger point release to reduca muscle tension.                   PT Short Term Goals - 12/13/15 1634      PT SHORT TERM GOAL #1   Title Patient will report 2/10 pain at worst in her left shoulder    Baseline No pain assess carryover    Time 4   Period Weeks   Status On-going     PT SHORT TERM GOAL #2   Title Patient will report centralized pain in her lL shoulder   Baseline No radicular pain    Time 4   Period Weeks   Status Achieved     PT SHORT TERM GOAL #3   Title Patient will be independent with initial HEP    Baseline demsotrated  initial HEP with minimal cuing    Time 4   Period Weeks   Status On-going     PT SHORT TERM GOAL #4   Title Patient will increase gross L upper extremity strength to 4+/10    Time 4   Period Weeks   Status On-going     PT SHORT TERM GOAL #5   Title Patient will demsotrate full pain free left shoulder ROM    Baseline full motion today    Time 4   Period Weeks           PT Long Term Goals - 11/03/15 1305      PT LONG TERM GOAL #1   Title Patient will reach up into a cabinent and grab a 2lb object without pain    Time 8   Period Weeks   Status New     PT LONG TERM GOAL #2   Title Patient will perfrom work tasks without increased pain    Time 8   Period Weeks   Status New     PT LONG TERM GOAL #3   Title Patient will return to the gym with a program to improve strength of shoulder and postural exercises.    Time 8   Period Weeks   Status New               Plan - 12/14/15 0756    Clinical Impression Statement Patient has reached all goals for therapy. She is having no pain in her shoulder. She feels comfortable perfroming her HEP at home. At this time she would like to work on her knee. She has a program to continue with HEP.    Rehab Potential Good   PT Frequency 2x / week   PT Duration 8 weeks   PT Treatment/Interventions ADLs/Self Care Home Management;Cryotherapy;Electrical Stimulation;Iontophoresis 59m/ml Dexamethasone;Moist Heat;Ultrasound;Therapeutic activities;Therapeutic exercise;Neuromuscular re-education;Patient/family education;Energy conservation;Passive range of motion;Manual techniques;Taping;Dry needling   PT Next Visit Plan continue with manual therapy for end range shoulder ROM and cervical ROM and spasming. Assess radicular signs. Add scapular strengthening exercises. Consider RTC strengthening if able. Consider modalites such as ultraspund or Ionto.    PT Home Exercise Plan updated for gym exercises and supine    Consulted and  Agree with Plan  of Care Patient      Patient will benefit from skilled therapeutic intervention in order to improve the following deficits and impairments:  Decreased activity tolerance, Decreased strength, Decreased mobility, Postural dysfunction, Impaired UE functional use, Increased muscle spasms, Decreased range of motion  Visit Diagnosis: Acute pain of left shoulder  Stiffness of left shoulder, not elsewhere classified  Cervicalgia  Muscle weakness (generalized)  PHYSICAL THERAPY DISCHARGE SUMMARY  Visits from Start of Care: 4 Current functional level related to goals / functional outcomes: Able to perform all activity with left upper extremity   Remaining deficits: None    Education / Equipment: Patient given HEP  Plan: Patient agrees to discharge.  Patient goals were partially met. Patient is being discharged due to meeting the stated rehab goals.  ?????       Problem List Patient Active Problem List   Diagnosis Date Noted  . Chest pain 10/13/2014  . GERD (gastroesophageal reflux disease) 10/13/2014  . Diabetes mellitus type 2, controlled ([place]) 09/29/2014  . Hyperlipidemia 12/08/2013  . Lichen sclerosus et atrophicus of the vulva 06/09/2013    Class: Diagnosis of  . Essential hypertension 05/12/2013    Class: History of    Carney Living PT DPT  12/14/2015, 8:04 AM  [facility] [address] [place], [place], [address] Phone: [phone]   Fax:  [phone]  Name: Nancy Chandler MRN: [id number] Date of Birth: 10-20-1955

## 2016-02-02 ENCOUNTER — Other Ambulatory Visit: Payer: Self-pay | Admitting: Family

## 2016-02-02 DIAGNOSIS — R221 Localized swelling, mass and lump, neck: Secondary | ICD-10-CM

## 2016-02-08 ENCOUNTER — Other Ambulatory Visit: Payer: Self-pay | Admitting: Family

## 2016-02-08 ENCOUNTER — Encounter: Payer: Self-pay | Admitting: Physical Therapy

## 2016-02-08 ENCOUNTER — Ambulatory Visit: Payer: 59 | Attending: Family Medicine | Admitting: Physical Therapy

## 2016-02-08 DIAGNOSIS — M6281 Muscle weakness (generalized): Secondary | ICD-10-CM | POA: Diagnosis present

## 2016-02-08 DIAGNOSIS — M542 Cervicalgia: Secondary | ICD-10-CM | POA: Diagnosis present

## 2016-02-08 DIAGNOSIS — M25512 Pain in left shoulder: Secondary | ICD-10-CM | POA: Diagnosis present

## 2016-02-08 DIAGNOSIS — M25612 Stiffness of left shoulder, not elsewhere classified: Secondary | ICD-10-CM | POA: Insufficient documentation

## 2016-02-08 DIAGNOSIS — R221 Localized swelling, mass and lump, neck: Secondary | ICD-10-CM

## 2016-02-09 NOTE — Therapy (Signed)
[place], [place], [address] Phone: [phone]   Fax:  [phone]  Physical Therapy Evaluation  Patient Details  Name: Nancy Chandler MRN: [id number] Date of Birth: 16-Jun-1955 Referring Provider: Dr Antony Blackbird Per [facility].   Encounter Date: 02/08/2016      PT End of Session - 02/09/16 0847    Visit Number 1   Number of Visits 16   Date for PT Re-Evaluation 04/05/16   PT Start Time 1630   PT Stop Time 1717   PT Time Calculation (min) 47 min   Activity Tolerance Patient tolerated treatment well   Behavior During Therapy [facility] for tasks assessed/performed      Past Medical History:  Diagnosis Date  . Abnormal Pap smear of cervix 2010   HPV  . Anxiety   . Depression   . Diabetes mellitus without complication ([place])   . Dyspareunia   . Hyperlipidemia   . Hypertension   . Lichen sclerosus     Past Surgical History:  Procedure Laterality Date  . COLPOSCOPY  2010  . TUBAL LIGATION  1990    There were no vitals filed for this visit.       Subjective Assessment - 02/08/16 1641    Subjective Patient rpeorts a long histroy of knee pain wheh has been oincreasing recently. She fells pain and crepitus behind her knee cap. She has the most pain when she is going up/down stairs and when she is transfering from sit to stand.    Pertinent History DMII   Limitations Lifting;House hold activities   How long can you sit comfortably? N/A   How long can you stand comfortably? N/A    How long can you walk comfortably? 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 Yes   Pain Score 8    Pain Location Knee   Pain Orientation Right   Pain Descriptors / Indicators Aching;Sharp   Pain Type Acute pain   Pain Onset More than a month ago   Pain Frequency Rarely   Aggravating Factors  transfering  from sit to stand. Going up and down stairs    Pain Relieving Factors rest    Effect of Pain on Daily Activities difficulty standing and sitting             [facility] PT Assessment - 02/08/16 1645      Assessment   Medical Diagnosis right knee pain    Referring Provider Dr Ander Gaster Fulp    Onset Date/Surgical Date --  Several years prior   Hand Dominance Right   Next MD Visit 4 months    Prior Therapy Yes for right shoulder      Precautions   Precautions None     Restrictions   Weight Bearing Restrictions No     Balance Screen   Has the patient fallen in the past 6 months No     Home Environment   Additional Comments 5 steps going into the entry way of the house      Prior Function   Level of Independence Independent   Vocation Full time employment   Vocation Requirements Sitting at a computer      Cognition   Overall Cognitive Status Within Functional Limits for tasks assessed   Attention Focused   Focused Attention Appears intact   Memory Appears intact   Awareness  Appears intact   Problem Solving Appears intact     Sensation   Additional Comments No numbness      ROM / Strength   AROM / PROM / Strength AROM;PROM;Strength     AROM   Overall AROM Comments full active ROM of the knee crepitus noted of the right knee      PROM   Overall PROM Comments full passive right knee ROM      Strength   Strength Assessment Site Knee   Right/Left Hip Right;Left   Right Hip Flexion 3+/5   Right Hip ABduction 4/5   Right Hip ADduction 5/5   Left Hip Flexion 5/5   Left Hip ABduction 5/5   Left Hip ADduction 5/5   Right/Left Knee Right   Right Knee Flexion 4/5   Right Knee Extension 4/5     Palpation   Patella mobility medial placement of the patella in resting position   Palpation comment tenderness to palpation with patellar compression      Special Tests    Special Tests --  valfgus verus(-) appleys compression (-)      Ambulation/Gait   Gait Comments  Normal gait pattern                    [facility] Adult PT Treatment/Exercise - 02/09/16 0001      Ambulation/Gait   Gait Comments Normal gait pattern      Knee/Hip Exercises: Standing   Other Standing Knee Exercises standing flexion with cuing to flex hip straight 2x10     Knee/Hip Exercises: Supine   Bridges Limitations Bridge with isometric band abduction 2x10    Straight Leg Raises Limitations 2x10    Other Supine Knee/Hip Exercises supine clamshell 2x10      Modalities   Modalities Cryotherapy     Moist Heat Therapy   Number Minutes Moist Heat 10 Minutes   Moist Heat Location Knee                PT Education - 02/09/16 0846    Education provided Yes   Education Details HEP, symptom mangement    Person(s) Educated Patient   Methods Explanation;Verbal cues;Handout   Comprehension Returned demonstration;Verbalized understanding;Tactile cues required          PT Short Term Goals - 02/09/16 0858      PT SHORT TERM GOAL #1   Title Patient will increase gross right lower extremity strength to 4+/5    Time 4   Period Weeks   Status New     PT SHORT TERM GOAL #2   Title Patient will be indenpndent with inital HEP    Time 4   Period Weeks     PT SHORT TERM GOAL #3   Title Patient will demsotrate a 15 second single leg stance time on the right    Time 4   Period Weeks   Status New           PT Long Term Goals - 02/09/16 0901      PT LONG TERM GOAL #1   Title Patient will go up/down 8 steps without oincreased pain in order to go into her house    Time 8   Period Weeks   Status New     PT LONG TERM GOAL #2   Title Patient will transfer from sit to stand 3x without increased pain    Time 8   Period Weeks   Status New  PT LONG TERM GOAL #3   Title Patient will ambulate 1 mile without increased pain in order to perfrom ADL's    Time 8   Period Weeks   Status New               Plan - 02/09/16 0851    Clinical Impression  Statement Patient is a 60 year old female with right knee pain and instability. She has crepitus with knee flexion and limited knee flexion strength. She would benefit from skilled therapy to improve her strength and stability of her right knee. She may also benefit from taping.    Rehab Potential Good   PT Frequency 2x / week   PT Duration 8 weeks   PT Treatment/Interventions ADLs/Self Care Home Management;Cryotherapy;Electrical Stimulation;Iontophoresis 4mg /ml Dexamethasone;Moist Heat;Ultrasound;Therapeutic activities;Therapeutic exercise;Neuromuscular re-education;Patient/family education;Energy conservation;Passive range of motion;Manual techniques;Taping;Dry needling   PT Next Visit Plan continue with manual therapy for end range shoulder ROM and cervical ROM and spasming. Assess radicular signs. Add scapular strengthening exercises. Consider RTC strengthening if able. Consider modalites such as ultraspund or Ionto.    PT Home Exercise Plan updated for gym exercises and supine    Consulted and Agree with Plan of Care Patient      Patient will benefit from skilled therapeutic intervention in order to improve the following deficits and impairments:  Decreased activity tolerance, Decreased strength, Decreased mobility, Postural dysfunction, Impaired UE functional use, Increased muscle spasms, Decreased range of motion  Visit Diagnosis: Acute pain of left shoulder - Plan: PT plan of care cert/re-cert  Stiffness of left shoulder, not elsewhere classified - Plan: PT plan of care cert/re-cert  Cervicalgia - Plan: PT plan of care cert/re-cert  Muscle weakness (generalized) - Plan: PT plan of care cert/re-cert     Problem List Patient Active Problem List   Diagnosis Date Noted  . Chest pain 10/13/2014  . GERD (gastroesophageal reflux disease) 10/13/2014  . Diabetes mellitus type 2, controlled ([place]) 09/29/2014  . Hyperlipidemia 12/08/2013  . Lichen sclerosus et atrophicus of the vulva  06/09/2013    Class: Diagnosis of  . Essential hypertension 05/12/2013    Class: History of    Carney Living  PT DPT  02/09/2016, 9:27 AM  [facility] [address] [place], [place], [address] Phone: [phone]   Fax:  [phone]  Name: Illana Boyce MRN: [id number] Date of Birth: 12/23/1955

## 2016-02-10 ENCOUNTER — Ambulatory Visit
Admission: RE | Admit: 2016-02-10 | Discharge: 2016-02-10 | Disposition: A | Discharge status: Home / Self Care | Payer: 59 | Source: Ambulatory Visit | Attending: Family | Admitting: Family

## 2016-02-10 DIAGNOSIS — R221 Localized swelling, mass and lump, neck: Secondary | ICD-10-CM

## 2016-02-15 ENCOUNTER — Other Ambulatory Visit: Payer: Self-pay | Admitting: Family

## 2016-02-15 DIAGNOSIS — E041 Nontoxic single thyroid nodule: Secondary | ICD-10-CM

## 2016-02-23 ENCOUNTER — Ambulatory Visit: Payer: 59 | Admitting: Physical Therapy

## 2016-02-28 ENCOUNTER — Ambulatory Visit: Payer: 59 | Attending: Family Medicine | Admitting: Physical Therapy

## 2016-02-28 DIAGNOSIS — M542 Cervicalgia: Secondary | ICD-10-CM | POA: Diagnosis present

## 2016-02-28 DIAGNOSIS — M6281 Muscle weakness (generalized): Secondary | ICD-10-CM | POA: Diagnosis present

## 2016-02-28 DIAGNOSIS — M25612 Stiffness of left shoulder, not elsewhere classified: Secondary | ICD-10-CM | POA: Insufficient documentation

## 2016-02-28 DIAGNOSIS — M25512 Pain in left shoulder: Secondary | ICD-10-CM | POA: Insufficient documentation

## 2016-02-28 NOTE — Therapy (Signed)
[place], [place], [address] Phone: [phone]   Fax:  [phone]  Physical Therapy Treatment  Patient Details  Name: Nancy Chandler MRN: [id number] Date of Birth: 1955-07-03 Referring Provider: Dr Antony Blackbird   Encounter Date: 02/28/2016      PT End of Session - 02/28/16 1513    Visit Number 2   Number of Visits 16   Date for PT Re-Evaluation 04/05/16   PT Start Time 1500   PT Stop Time 1540   PT Time Calculation (min) 40 min   Activity Tolerance Patient tolerated treatment well   Behavior During Therapy [facility] for tasks assessed/performed      Past Medical History:  Diagnosis Date  . Abnormal Pap smear of cervix 2010   HPV  . Anxiety   . Depression   . Diabetes mellitus without complication ([place])   . Dyspareunia   . Hyperlipidemia   . Hypertension   . Lichen sclerosus     Past Surgical History:  Procedure Laterality Date  . COLPOSCOPY  2010  . TUBAL LIGATION  1990    There were no vitals filed for this visit.      Subjective Assessment - 02/28/16 1510    Subjective Patient reports that after the last treatment she did not have much pain. She ver did it one day and her pain went up. She then became sick and she was off her feet for several days. IT IS NOW FEELING BETTER.    Pertinent History DMII   Limitations Lifting;House hold activities   How long can you sit comfortably? N/A   How long can you stand comfortably? N/A    How long can you walk comfortably? 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 Yes   Pain Score 4    Pain Location Knee   Pain Orientation Right   Pain Descriptors / Indicators Aching;Sharp   Pain Type Acute pain   Pain Onset More than a month ago   Pain Frequency Constant   Aggravating Factors  Transfering from sit to stand    Pain Relieving Factors rest     Effect of Pain on Daily Activities difficulty standing    Multiple Pain Sites No                         [facility] Adult PT Treatment/Exercise - 02/28/16 0001      Knee/Hip Exercises: Machines for Strengthening   Total Gym Leg Press 40 lb 3x10      Knee/Hip Exercises: Standing   Hip Flexion Limitations standing march 2x10    Abduction Limitations x10 bilateral    Extension Limitations x10 bilateral    Other Standing Knee Exercises step onto air-ex and hold      Knee/Hip Exercises: Seated   Hamstring Limitations 2x10 red      Knee/Hip Exercises: Supine   Quad Sets Limitations 2x10    Bridges Limitations Bridge with isometric band abduction 2x10    Straight Leg Raises Limitations 3x10 i1 lb weight    Other Supine Knee/Hip Exercises supine clamshell 3x10 red                 PT Education - 02/28/16 1512    Education provided Yes   Education Details continue with HEP    Person(s) Educated Patient   Methods Explanation;Demonstration;Handout  Comprehension Verbalized understanding;Returned demonstration;Tactile cues required          PT Short Term Goals - 02/28/16 2009      PT SHORT TERM GOAL #1   Title Patient will increase gross right lower extremity strength to 4+/5    Baseline No pain assess carryover    Time 4   Period Weeks   Status On-going     PT SHORT TERM GOAL #2   Title Patient will be indenpndent with inital HEP    Baseline No radicular pain    Time 4   Period Weeks   Status On-going     PT SHORT TERM GOAL #3   Title Patient will demsotrate a 15 second single leg stance time on the right    Baseline demsotrated initial HEP with minimal cuing    Time 4   Period Weeks   Status On-going     PT SHORT TERM GOAL #4   Title Patient will increase gross L upper extremity strength to 4+/10    Time 4   Period Weeks   Status On-going     PT SHORT TERM GOAL #5   Title Patient will demsotrate full pain free left shoulder ROM     Baseline full motion today    Time 4   Period Weeks   Status On-going           PT Long Term Goals - 02/09/16 0901      PT LONG TERM GOAL #1   Title Patient will go up/down 8 steps without oincreased pain in order to go into her house    Time 8   Period Weeks   Status New     PT LONG TERM GOAL #2   Title Patient will transfer from sit to stand 3x without increased pain    Time 8   Period Weeks   Status New     PT LONG TERM GOAL #3   Title Patient will ambulate 1 mile without increased pain in order to perfrom ADL's    Time [place] - 02/28/16 2004    Clinical Impression Statement Patient had no increase in pain with treatment. Therapy added standing hip exercises bilateral and leg press with no increase in pain. She was advised to progress home exercises as tolerated.    Rehab Potential Good   PT Frequency 2x / week   PT Duration 8 weeks   PT Treatment/Interventions ADLs/Self Care Home Management;Cryotherapy;Electrical Stimulation;Iontophoresis 4mg /ml Dexamethasone;Moist Heat;Ultrasound;Therapeutic activities;Therapeutic exercise;Neuromuscular re-education;Patient/family education;Energy conservation;Passive range of motion;Manual techniques;Taping;Dry needling   PT Next Visit Plan assess tolerance to new exercises for her knee. Progress weights with SLR and SAQ as tolerated. Add 4 inch step up.    PT Home Exercise Plan updated for gym exercises and supine    Consulted and Agree with Plan of Care Patient      Patient will benefit from skilled therapeutic intervention in order to improve the following deficits and impairments:  Decreased activity tolerance, Decreased strength, Decreased mobility, Postural dysfunction, Impaired UE functional use, Increased muscle spasms, Decreased range of motion  Visit Diagnosis: Acute pain of left shoulder  Stiffness of left shoulder, not elsewhere classified  Cervicalgia  Muscle  weakness (generalized)     Problem List Patient Active Problem List   Diagnosis Date Noted  . Chest pain 10/13/2014  . GERD (gastroesophageal reflux  disease) 10/13/2014  . Diabetes mellitus type 2, controlled ([place]) 09/29/2014  . Hyperlipidemia 12/08/2013  . Lichen sclerosus et atrophicus of the vulva 06/09/2013    Class: Diagnosis of  . Essential hypertension 05/12/2013    Class: History of    Carney Living PT DPT  02/28/2016, 8:17 PM  [facility] [address] [place], [place], [address] Phone: [phone]   Fax:  [phone]  Name: Nancy Chandler MRN: [id number] Date of Birth: 1955/06/24

## 2016-03-01 ENCOUNTER — Ambulatory Visit
Admission: RE | Admit: 2016-03-01 | Discharge: 2016-03-01 | Disposition: A | Discharge status: Home / Self Care | Payer: 59 | Source: Ambulatory Visit | Attending: Family | Admitting: Family

## 2016-03-01 ENCOUNTER — Other Ambulatory Visit (HOSPITAL_COMMUNITY)
Admission: RE | Admit: 2016-03-01 | Discharge: 2016-03-01 | Disposition: A | Discharge status: Home / Self Care | Payer: 59 | Source: Ambulatory Visit | Attending: Radiology | Admitting: Radiology

## 2016-03-01 ENCOUNTER — Ambulatory Visit: Payer: 59 | Admitting: Physical Therapy

## 2016-03-01 DIAGNOSIS — E041 Nontoxic single thyroid nodule: Secondary | ICD-10-CM | POA: Diagnosis present

## 2016-03-06 ENCOUNTER — Encounter: Payer: Self-pay | Admitting: Physical Therapy

## 2016-03-06 ENCOUNTER — Ambulatory Visit: Payer: 59 | Admitting: Physical Therapy

## 2016-03-06 DIAGNOSIS — M25512 Pain in left shoulder: Secondary | ICD-10-CM | POA: Diagnosis not present

## 2016-03-06 DIAGNOSIS — M542 Cervicalgia: Secondary | ICD-10-CM

## 2016-03-06 DIAGNOSIS — M25612 Stiffness of left shoulder, not elsewhere classified: Secondary | ICD-10-CM

## 2016-03-06 DIAGNOSIS — M6281 Muscle weakness (generalized): Secondary | ICD-10-CM

## 2016-03-07 NOTE — Therapy (Signed)
[place], [place], [address] Phone: [phone]   Fax:  [phone]  Physical Therapy Treatment  Patient Details  Name: Nancy Chandler MRN: [id number] Date of Birth: September 18, 1955 Referring Provider: Dr Antony Blackbird   Encounter Date: 03/06/2016      PT End of Session - 03/06/16 1637    Visit Number 3   Number of Visits 16   Date for PT Re-Evaluation 04/05/16   PT Start Time 1632   PT Stop Time [id number]   PT Time Calculation (min) 43 min   Activity Tolerance Patient tolerated treatment well   Behavior During Therapy [facility] for tasks assessed/performed      Past Medical History:  Diagnosis Date  . Abnormal Pap smear of cervix 2010   HPV  . Anxiety   . Depression   . Diabetes mellitus without complication ([place])   . Dyspareunia   . Hyperlipidemia   . Hypertension   . Lichen sclerosus     Past Surgical History:  Procedure Laterality Date  . COLPOSCOPY  2010  . TUBAL LIGATION  1990    There were no vitals filed for this visit.      Subjective Assessment - 03/06/16 1635    Subjective Patient reports she was sore after the last visit. She is not haing much pain right now but she is very sitff. She has been stiff since the weekend.    Pertinent History DMII   Limitations Lifting;House hold activities   How long can you sit comfortably? N/A   How long can you stand comfortably? N/A    How long can you walk comfortably? 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 No/denies                                 PT Education - 03/06/16 1637    Education provided Yes   Education Details continue with HEP    Person(s) Educated Patient   Methods Explanation;Demonstration;Handout   Comprehension Returned demonstration;Verbalized understanding;Tactile cues required          PT  Short Term Goals - 03/06/16 1647      PT SHORT TERM GOAL #1   Title Patient will increase gross right lower extremity strength to 4+/5    Baseline No pain assess carryover    Time 4   Period Weeks   Status On-going     PT SHORT TERM GOAL #2   Title Patient will be indenpndent with inital HEP    Baseline No radicular pain    Time 4   Period Weeks   Status On-going     PT SHORT TERM GOAL #3   Title Patient will demsotrate a 15 second single leg stance time on the right    Time 4   Period Weeks   Status On-going     PT SHORT TERM GOAL #4   Title Patient will increase gross L upper extremity strength to 4+/10    Time 4   Period Weeks   Status On-going     PT SHORT TERM GOAL #5   Title Patient will demsotrate full pain free left shoulder ROM    Baseline full motion today    Time 4   Period Weeks   Status On-going  PT Long Term Goals - 02/09/16 0901      PT LONG TERM GOAL #1   Title Patient will go up/down 8 steps without oincreased pain in order to go into her house    Time 8   Period Weeks   Status New     PT LONG TERM GOAL #2   Title Patient will transfer from sit to stand 3x without increased pain    Time 8   Period Weeks   Status New     PT LONG TERM GOAL #3   Title Patient will ambulate 1 mile without increased pain in order to perfrom ADL's    Time 8   Period Weeks   Status New               Plan - 03/06/16 1639    Clinical Impression Statement Patient had pain with the nu-step. She had no pain with exercises but she was fatiuged. Therapy did not have her perform any exercises with excessive bending 2nd to increased soreness after the last visit. She was advised to continue her exercises at home.     Rehab Potential Good   PT Frequency 2x / week   PT Duration 8 weeks   PT Treatment/Interventions ADLs/Self Care Home Management;Cryotherapy;Electrical Stimulation;Iontophoresis 4mg /ml Dexamethasone;Moist Heat;Ultrasound;Therapeutic  activities;Therapeutic exercise;Neuromuscular re-education;Patient/family education;Energy conservation;Passive range of motion;Manual techniques;Taping;Dry needling   PT Next Visit Plan assess tolerance to new exercises for her knee. Progress weights with SLR and SAQ as tolerated. Add 4 inch step up.    PT Home Exercise Plan updated for gym exercises and supine    Consulted and Agree with Plan of Care Patient      Patient will benefit from skilled therapeutic intervention in order to improve the following deficits and impairments:  Decreased activity tolerance, Decreased strength, Decreased mobility, Postural dysfunction, Impaired UE functional use, Increased muscle spasms, Decreased range of motion  Visit Diagnosis: Acute pain of left shoulder  Stiffness of left shoulder, not elsewhere classified  Cervicalgia  Muscle weakness (generalized)     Problem List Patient Active Problem List   Diagnosis Date Noted  . Chest pain 10/13/2014  . GERD (gastroesophageal reflux disease) 10/13/2014  . Diabetes mellitus type 2, controlled ([place]) 09/29/2014  . Hyperlipidemia 12/08/2013  . Lichen sclerosus et atrophicus of the vulva 06/09/2013    Class: Diagnosis of  . Essential hypertension 05/12/2013    Class: History of    Carney Living PT DPT  03/07/2016, 8:31 AM  [facility] [address] [place], [place], [address] Phone: [phone]   Fax:  [phone]  Name: Nancy Chandler MRN: [id number] Date of Birth: September 22, 1955

## 2016-03-08 ENCOUNTER — Ambulatory Visit: Payer: 59 | Admitting: Physical Therapy

## 2016-03-13 ENCOUNTER — Ambulatory Visit: Payer: 59 | Admitting: Physical Therapy

## 2016-03-13 DIAGNOSIS — M25512 Pain in left shoulder: Secondary | ICD-10-CM | POA: Diagnosis not present

## 2016-03-13 DIAGNOSIS — M25612 Stiffness of left shoulder, not elsewhere classified: Secondary | ICD-10-CM

## 2016-03-13 DIAGNOSIS — M542 Cervicalgia: Secondary | ICD-10-CM

## 2016-03-13 DIAGNOSIS — M6281 Muscle weakness (generalized): Secondary | ICD-10-CM

## 2016-03-13 NOTE — Therapy (Signed)
[place], [place], [address] Phone: [phone]   Fax:  [phone]  Physical Therapy Treatment  Patient Details  Name: Nancy Chandler MRN: [id number] Date of Birth: March 25, 1955 Referring Provider: Dr Antony Blackbird   Encounter Date: 03/13/2016      PT End of Session - 03/13/16 1657    Visit Number 4   Number of Visits 16   Date for PT Re-Evaluation 04/05/16   PT Start Time 1633   PT Stop Time 1713   PT Time Calculation (min) 40 min   Activity Tolerance Patient tolerated treatment well   Behavior During Therapy [facility] for tasks assessed/performed      Past Medical History:  Diagnosis Date  . Abnormal Pap smear of cervix 2010   HPV  . Anxiety   . Depression   . Diabetes mellitus without complication ([place])   . Dyspareunia   . Hyperlipidemia   . Hypertension   . Lichen sclerosus     Past Surgical History:  Procedure Laterality Date  . COLPOSCOPY  2010  . TUBAL LIGATION  1990    There were no vitals filed for this visit.      Subjective Assessment - 03/13/16 1654    Subjective Patient reports that her knee has been sore when she needs to do things like get down on the floor. She is having no at the start of the treatment today. It was a little sore after the last visit.    Pertinent History DMII   Limitations Lifting;House hold activities   How long can you sit comfortably? N/A   How long can you stand comfortably? N/A    How long can you walk comfortably? 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 No/denies                         [facility] Adult PT Treatment/Exercise - 03/13/16 0001      Knee/Hip Exercises: Standing   Hip Flexion Limitations standing march 2x10    Abduction Limitations 2x10 right 1lb    Extension Limitations 2x10 right 1lb    Other Standing Knee  Exercises step onto air-ex and hold      Knee/Hip Exercises: Supine   Bridges Limitations Bridge with isometric band abduction 3x10    Straight Leg Raises Limitations 3x10 1lb  weight    Other Supine Knee/Hip Exercises supine clamshell 3x10 red                 PT Education - 03/13/16 1656    Education provided Yes   Education Details rationale behind knee strengthening    Person(s) Educated Patient   Methods Explanation;Demonstration;Handout   Comprehension Verbalized understanding;Returned demonstration;Tactile cues required          PT Short Term Goals - 03/06/16 1647      PT SHORT TERM GOAL #1   Title Patient will increase gross right lower extremity strength to 4+/5    Baseline No pain assess carryover    Time 4   Period Weeks   Status On-going     PT SHORT TERM GOAL #2   Title Patient will be indenpndent with inital HEP    Baseline No radicular pain    Time 4   Period Weeks   Status On-going     PT SHORT TERM GOAL #3  Title Patient will demsotrate a 15 second single leg stance time on the right    Time 4   Period Weeks   Status On-going     PT SHORT TERM GOAL #4   Title Patient will increase gross L upper extremity strength to 4+/10    Time 4   Period Weeks   Status On-going     PT SHORT TERM GOAL #5   Title Patient will demsotrate full pain free left shoulder ROM    Baseline full motion today    Time 4   Period Weeks   Status On-going           PT Long Term Goals - 02/09/16 0901      PT LONG TERM GOAL #1   Title Patient will go up/down 8 steps without oincreased pain in order to go into her house    Time 8   Period Weeks   Status New     PT LONG TERM GOAL #2   Title Patient will transfer from sit to stand 3x without increased pain    Time 8   Period Weeks   Status New     PT LONG TERM GOAL #3   Title Patient will ambulate 1 mile without increased pain in order to perfrom ADL's    Time 8   Period Weeks   Status New                Plan - 03/13/16 1658    Clinical Impression Statement Patient tolerated the new step better today. She had no increase in pain with the exercises. Therapy educated her on the importance of improving strength. She will need to continue strengthening long term.    Rehab Potential Good   PT Frequency 2x / week   PT Duration 8 weeks   PT Treatment/Interventions ADLs/Self Care Home Management;Cryotherapy;Electrical Stimulation;Iontophoresis 4mg /ml Dexamethasone;Moist Heat;Ultrasound;Therapeutic activities;Therapeutic exercise;Neuromuscular re-education;Patient/family education;Energy conservation;Passive range of motion;Manual techniques;Taping;Dry needling   PT Next Visit Plan assess tolerance to new exercises for her knee. Progress weights with SLR and SAQ as tolerated. Add 4 inch step up.    PT Home Exercise Plan updated for gym exercises and supine    Consulted and Agree with Plan of Care Patient      Patient will benefit from skilled therapeutic intervention in order to improve the following deficits and impairments:  Decreased activity tolerance, Decreased strength, Decreased mobility, Postural dysfunction, Impaired UE functional use, Increased muscle spasms, Decreased range of motion  Visit Diagnosis: Acute pain of left shoulder  Stiffness of left shoulder, not elsewhere classified  Cervicalgia  Muscle weakness (generalized)     Problem List Patient Active Problem List   Diagnosis Date Noted  . Chest pain 10/13/2014  . GERD (gastroesophageal reflux disease) 10/13/2014  . Diabetes mellitus type 2, controlled ([place]) 09/29/2014  . Hyperlipidemia 12/08/2013  . Lichen sclerosus et atrophicus of the vulva 06/09/2013    Class: Diagnosis of  . Essential hypertension 05/12/2013    Class: History of    Carney Living 03/13/2016, 9:10 PM  [facility] [address] [place], [place], [address] Phone: [phone]    Fax:  [phone]  Name: Nancy Chandler MRN: [id number] Date of Birth: December 19, 1955

## 2016-03-15 ENCOUNTER — Observation Stay (HOSPITAL_COMMUNITY): Payer: 59

## 2016-03-15 ENCOUNTER — Observation Stay (HOSPITAL_COMMUNITY)
Admission: EM | Admit: 2016-03-15 | Discharge: 2016-03-16 | Disposition: A | Discharge status: Home / Self Care | Payer: 59 | Attending: Internal Medicine | Admitting: Internal Medicine

## 2016-03-15 ENCOUNTER — Ambulatory Visit: Payer: 59 | Admitting: Physical Therapy

## 2016-03-15 ENCOUNTER — Emergency Department (HOSPITAL_COMMUNITY): Payer: 59

## 2016-03-15 ENCOUNTER — Encounter (HOSPITAL_COMMUNITY): Payer: Self-pay | Admitting: Emergency Medicine

## 2016-03-15 DIAGNOSIS — G459 Transient cerebral ischemic attack, unspecified: Principal | ICD-10-CM | POA: Insufficient documentation

## 2016-03-15 DIAGNOSIS — I10 Essential (primary) hypertension: Secondary | ICD-10-CM | POA: Insufficient documentation

## 2016-03-15 DIAGNOSIS — Z7984 Long term (current) use of oral hypoglycemic drugs: Secondary | ICD-10-CM | POA: Diagnosis not present

## 2016-03-15 DIAGNOSIS — R519 Headache, unspecified: Secondary | ICD-10-CM | POA: Diagnosis present

## 2016-03-15 DIAGNOSIS — R55 Syncope and collapse: Secondary | ICD-10-CM | POA: Diagnosis present

## 2016-03-15 DIAGNOSIS — E785 Hyperlipidemia, unspecified: Secondary | ICD-10-CM | POA: Diagnosis present

## 2016-03-15 DIAGNOSIS — E119 Type 2 diabetes mellitus without complications: Secondary | ICD-10-CM | POA: Diagnosis not present

## 2016-03-15 DIAGNOSIS — E1169 Type 2 diabetes mellitus with other specified complication: Secondary | ICD-10-CM

## 2016-03-15 DIAGNOSIS — Z79899 Other long term (current) drug therapy: Secondary | ICD-10-CM | POA: Diagnosis not present

## 2016-03-15 DIAGNOSIS — R51 Headache: Secondary | ICD-10-CM | POA: Diagnosis not present

## 2016-03-15 LAB — CBG MONITORING, ED: Glucose-Capillary: 156 mg/dL — ABNORMAL HIGH (ref 65–99)

## 2016-03-15 LAB — URINALYSIS, ROUTINE W REFLEX MICROSCOPIC
Bacteria, UA: NONE SEEN
Bilirubin Urine: NEGATIVE
Glucose, UA: NEGATIVE mg/dL
Ketones, ur: NEGATIVE mg/dL
Leukocytes, UA: NEGATIVE
Nitrite: NEGATIVE
Protein, ur: NEGATIVE mg/dL
Specific Gravity, Urine: 1.009 (ref 1.005–1.030)
pH: 7 (ref 5.0–8.0)

## 2016-03-15 LAB — BASIC METABOLIC PANEL
Anion gap: 10 (ref 5–15)
BUN: 13 mg/dL (ref 6–20)
CO2: 25 mmol/L (ref 22–32)
Calcium: 9 mg/dL (ref 8.9–10.3)
Chloride: 105 mmol/L (ref 101–111)
Creatinine, Ser: 0.81 mg/dL (ref 0.44–1.00)
GFR calc Af Amer: >60 mL/min (ref >60)
GFR calc non Af Amer: >60 mL/min (ref >60)
Glucose, Bld: 137 mg/dL — ABNORMAL HIGH (ref 65–99)
Potassium: 3.5 mmol/L (ref 3.5–5.1)
Sodium: 140 mmol/L (ref 135–145)

## 2016-03-15 LAB — CBC
HCT: 42.1 % (ref 36.0–46.0)
Hemoglobin: 13.8 g/dL (ref 12.0–15.0)
MCH: 26.6 pg (ref 26.0–34.0)
MCHC: 32.8 g/dL (ref 30.0–36.0)
MCV: 81.1 fL (ref 78.0–100.0)
Platelets: 182 10*3/uL (ref 150–400)
RBC: 5.19 MIL/uL — ABNORMAL HIGH (ref 3.87–5.11)
RDW: 13.9 % (ref 11.5–15.5)
WBC: 6 10*3/uL (ref 4.0–10.5)

## 2016-03-15 LAB — TROPONIN I: Troponin I: <0.03 ng/mL (ref <0.03)

## 2016-03-15 MED ORDER — ALPRAZOLAM 0.25 MG PO TABS
0.5000 mg | ORAL_TABLET | Freq: Once | ORAL | Status: AC
  Administered 2016-03-15: 0.5 mg via ORAL
  Filled 2016-03-15: qty 2

## 2016-03-15 NOTE — H&P (Signed)
Nancy Chandler [id number] DOB: [birth year] DOA: 03/15/2016     PCP: NP at [facility]: none Patient coming from:  home Lives   With family    Chief Complaint: syncope  HPI: Nancy Chandler is a 61 y.o. female with medical history significant of DM  2     Presented with  At :30 pm patient had sudden onset of sever right sided headache. She felt light headed and fell on her knees no nausea no vomiting. 5 min later she had an other episode she fell on the floor reports no complete LOC no chest pain, no leg swelling , no recent travel no fever or cough. No Shortness of breath or chest pain. She stil has residual headache.  She have had a headache before but never that severe. Denies photo or phonophobia no aura.   denies sick contacts. Reports good PO intake. No slurred speech no localized weakness Regarding pertinent Chronic problems: Dm 2 on metformin mild. Hypertension well controlled. No hx of CVA or MI   IN ER:  Temp (24hrs), Avg:97.9 F (36.6 C), Min:97.9 F (36.6 C), Max:97.9 F (36.6 C)  97.9 100% HR 72 147/66  Na 140 WBC 6.0  Ct head negative  Following Medications were ordered in ER: Medications - No data to display    Hospitalist was called for admission for syncope associated with headache.   Review of Systems:    Pertinent positives include:headache, presyncope  Constitutional:  No weight loss, night sweats, Fevers, chills, fatigue, weight loss  HEENT:  No headaches, Difficulty swallowing,Tooth/dental problems,Sore throat,  No sneezing, itching, ear ache, nasal congestion, post nasal drip,  Cardio-vascular:  No chest pain, Orthopnea, PND, anasarca, dizziness, palpitations.no Bilateral lower extremity swelling  GI:  No heartburn, indigestion, abdominal pain, nausea, vomiting, diarrhea, change in bowel habits, loss of appetite, melena, blood in stool, hematemesis Resp:  no shortness of breath at rest. No dyspnea on exertion,  No excess mucus, no productive cough, No non-productive cough, No coughing up of blood.No change in color of mucus.No wheezing. Skin:  no rash or lesions. No jaundice GU:  no dysuria, change in color of urine, no urgency or frequency. No straining to urinate.  No flank pain.  Musculoskeletal:  No joint pain or no joint swelling. No decreased range of motion. No back pain.  Psych:  No change in mood or affect. No depression or anxiety. No memory loss.  Neuro: no localizing neurological complaints, no tingling, no weakness, no double vision, no gait abnormality, no slurred speech, no confusion  As per HPI otherwise 10 point review of systems negative.   Past Medical History: Past Medical History:  Diagnosis Date  . Abnormal Pap smear of cervix 2010   HPV  . Anxiety   . Depression   . Diabetes mellitus without complication ([place])   . Dyspareunia   . Hyperlipidemia   . Hypertension   . Lichen sclerosus    Past Surgical History:  Procedure Laterality Date  . COLPOSCOPY  2010  . LYMPH NODE BIOPSY    . TUBAL LIGATION  1990     Social History:  Ambulatory   independently      reports that she has never smoked. She has never used smokeless tobacco. She reports that she does not drink alcohol or use drugs.  Allergies:   Allergies  Allergen Reactions  . Hydrocodone Nausea And Vomiting       Family History:   Family History  Problem  Relation Age of Onset  . Hypertension Brother   . Cancer Mother     colon  . Hypertension Father   . Alcohol abuse Father   . Diabetes Father   . Heart disease Maternal Grandmother     CHF  . Cancer Maternal Grandfather     kidney  . Heart disease Paternal Grandmother     CHF  . Breast cancer Paternal Aunt   . Heart disease Paternal Aunt     open heart surgery    Medications: Prior to Admission medications   Medication Sig Start Date End Date Taking? Authorizing Provider  EPINEPHrine 0.3 mg/0.3 mL IJ SOAJ injection Inject 0.3  mLs (0.3 mg total) into the muscle once. Use in case of allergic reaction emergency 09/28/14  Yes Gay Filler Copland, MD  hydrochlorothiazide (MICROZIDE) 12.5 MG capsule TAKE ONE CAPSULE BY MOUTH DAILY * NEEDS APPOINTMENT* 06/23/15  Yes Jaynee Eagles, PA-C  loratadine (CLARITIN) 10 MG tablet Take 10 mg by mouth daily as needed for allergies.   Yes Historical Provider, MD  metFORMIN (GLUCOPHAGE-XR) 500 MG 24 hr tablet Take 500 mg by mouth every evening. 01/31/16  Yes Historical Provider, MD  ranitidine (ZANTAC) 150 MG tablet TAKE 1 TABLET (150 MG TOTAL) BY MOUTH 2 (TWO) TIMES DAILY. Patient taking differently: TAKE 1 TABLET (150 MG TOTAL) BY MOUTH AS NEEDED FOR HEARTBURN 05/03/15  Yes Darreld Mclean, MD    Physical Exam: Patient Vitals for the past 24 hrs:  BP Temp Temp src Pulse SpO2 Height Weight  03/15/16 1932 147/66 97.9 F (36.6 C) Oral 72 100 % 5\' 3"  (1.6 m) 63.5 kg (140 lb)    1. General:  in No Acute distress 2. Psychological: Alert and    Oriented 3. Head/ENT:     Dry Mucous Membranes                          Head Non traumatic, neck supple                          Normal   Dentition 4. SKIN:  decreased Skin turgor,  Skin clean Dry and intact no rash 5. Heart: Regular rate and rhythm no  Murmur, Rub or gallop 6. Lungs:  Clear to auscultation bilaterally, no wheezes or crackles   7. Abdomen: Soft, non-tender, Non distended 8. Lower extremities: no clubbing, cyanosis, or edema 9. Neurologically  strength 5 out of 5 in all 4 extremities cranial nerves II through XII intact 10. MSK: Normal range of motion   body mass index is 24.8 kg/m.  Labs on Admission:   Labs on Admission: I have personally reviewed following labs and imaging studies  CBC:  Recent Labs Lab 03/15/16 1940  WBC 6.0  HGB 13.8  HCT 42.1  MCV 81.1  PLT [id number]   Basic Metabolic Panel:  Recent Labs Lab 03/15/16 1940  NA 140  K 3.5  CL 105  CO2 25  GLUCOSE 137*  BUN 13  CREATININE 0.81  CALCIUM 9.0    GFR: Estimated Creatinine Clearance: 65.4 mL/min (by C-G formula based on SCr of 0.81 mg/dL). Liver Function Tests: No results for input(s): AST, ALT, ALKPHOS, BILITOT, PROT, ALBUMIN in the last 168 hours. No results for input(s): LIPASE, AMYLASE in the last 168 hours. No results for input(s): AMMONIA in the last 168 hours. Coagulation Profile: No results for input(s): INR, PROTIME in the last 168 hours.  Cardiac Enzymes: No results for input(s): CKTOTAL, CKMB, CKMBINDEX, TROPONINI in the last 168 hours. BNP (last 3 results) No results for input(s): PROBNP in the last 8760 hours. HbA1C: No results for input(s): HGBA1C in the last 72 hours. CBG:  Recent Labs Lab 03/15/16 1942  GLUCAP 156*   Lipid Profile: No results for input(s): CHOL, HDL, LDLCALC, TRIG, CHOLHDL, LDLDIRECT in the last 72 hours. Thyroid Function Tests: No results for input(s): TSH, T4TOTAL, FREET4, T3FREE, THYROIDAB in the last 72 hours. Anemia Panel: No results for input(s): VITAMINB12, FOLATE, FERRITIN, TIBC, IRON, RETICCTPCT in the last 72 hours. Urine analysis:    Component Value Date/Time   COLORURINE STRAW (A) 03/15/2016 2018   APPEARANCEUR CLEAR 03/15/2016 2018   LABSPEC 1.009 03/15/2016 2018   PHURINE 7.0 03/15/2016 2018   GLUCOSEU NEGATIVE 03/15/2016 2018   HGBUR SMALL (A) 03/15/2016 2018   BILIRUBINUR NEGATIVE 03/15/2016 2018   BILIRUBINUR - 02/06/2014 [place] 03/15/2016 2018   PROTEINUR NEGATIVE 03/15/2016 2018   UROBILINOGEN negative 02/06/2014 1048   NITRITE NEGATIVE 03/15/2016 2018   LEUKOCYTESUR NEGATIVE 03/15/2016 2018   Sepsis Labs: @LABRCNTIP (procalcitonin:4,lacticidven:4) )No results found for this or any previous visit (from the past 240 hour(s)).     UA  no evidence of UTI    Lab Results  Component Value Date   HGBA1C 6.1 02/01/2015    Estimated Creatinine Clearance: 65.4 mL/min (by C-G formula based on SCr of 0.81 mg/dL).  BNP (last 3 results) No  results for input(s): PROBNP in the last 8760 hours.   ECG REPORT  Independently reviewed Rate: 73  Rhythm: NSR ST&T Change: No acute ischemic changes   QTC 443  Filed Weights   03/15/16 1932  Weight: 63.5 kg (140 lb)     Cultures: No results found for: SDES, SPECREQUEST, CULT, REPTSTATUS   Radiological Exams on Admission: Dg Chest 2 View  Result Date: 03/15/2016 CLINICAL DATA:  61 year old female with syncope. EXAM: CHEST  2 VIEW COMPARISON:  Chest radiograph dated 08/18/2014 FINDINGS: The heart size and mediastinal contours are within normal limits. Both lungs are clear. The visualized skeletal structures are unremarkable. IMPRESSION: No active cardiopulmonary disease. Electronically Signed   By: Anner Crete M.D.   On: 03/15/2016 22:19   Ct Head Wo Contrast  Result Date: 03/15/2016 CLINICAL DATA:  Right-sided headache and 2 syncopal episodes tonight. EXAM: CT HEAD WITHOUT CONTRAST TECHNIQUE: Contiguous axial images were obtained from the base of the skull through the vertex without intravenous contrast. COMPARISON:  None. FINDINGS: Brain: There is no evidence of acute cortical infarct, intracranial hemorrhage, mass, midline shift, or extra-axial fluid collection. The ventricles and sulci are normal. Vascular: The mild calcified atherosclerosis at the skullbase. No hyperdense vessel. Skull: No fracture or focal osseous lesion. Sinuses/Orbits: Visualized paranasal sinuses and mastoid air cells are clear. Orbits are unremarkable. Other: None. IMPRESSION: No evidence of acute intracranial abnormality. Electronically Signed   By: Logan Bores M.D.   On: 03/15/2016 21:03   Mr Brain Wo Contrast (neuro Protocol)  Result Date: 03/16/2016 CLINICAL DATA:  Initial evaluation for acute syncope, headache. EXAM: MRI HEAD WITHOUT CONTRAST MRA HEAD WITHOUT CONTRAST TECHNIQUE: Multiplanar, multiecho pulse sequences of the brain and surrounding structures were obtained without intravenous contrast.  Angiographic images of the head were obtained using MRA technique without contrast. COMPARISON:  Prior CT from earlier the same day. FINDINGS: MRI HEAD FINDINGS Brain: Cerebral volume is within normal limits for age. Few tiny T2/FLAIR hyperintense foci noted within  the periventricular and deep white matter both cerebral hemispheres, nonspecific, but most likely related to chronic microvascular ischemic changes. This is felt to be within normal limits for age. No abnormal foci of restricted diffusion to suggest acute or subacute ischemia. Gray-white matter differentiation well maintained. No evidence for prior or chronic infarction. A single punctate focus of susceptibility artifact noted within the parasagittal right parietal lobe, likely a tiny chronic microhemorrhage, of doubtful clinical significance. No other evidence for acute or chronic intracranial hemorrhage. No mass lesion, midline shift or mass effect. Ventricles normal in size without evidence for hydrocephalus. No extra-axial fluid collection. Major dural sinuses are grossly patent. Pituitary gland and suprasellar region within normal limits. Vascular: Major intracranial vascular flow voids are maintained. Skull and upper cervical spine: Craniocervical junction normal. Mild degenerative spondylolysis noted within the visualized upper cervical spine without significant stenosis. Bone marrow signal intensity normal. No scalp soft tissue abnormality. Sinuses/Orbits: Globes and orbital soft tissues within normal limits. Paranasal sinuses and mastoid air cells are clear. Inner ear structures grossly normal. MRA HEAD FINDINGS ANTERIOR CIRCULATION: Distal cervical segments of the internal carotid arteries are patent with antegrade flow. Petrous, cavernous, and supraclinoid segments are widely patent bilaterally. A1 segments patent. Anterior communicating artery normal. Anterior cerebral arteries widely patent to their distal aspects. M1 segments patent without  stenosis or occlusion. MCA bifurcations normal. Distal MCA branches well opacified and symmetric. POSTERIOR CIRCULATION: Overall, vertebrobasilar system mildly diminutive. Left vertebral artery dominant. Vertebral arteries are widely patent to the vertebrobasilar junction. Posterior inferior cerebral arteries patent proximally. Basilar artery widely patent. Superior cerebral arteries patent bilaterally. P1 segments mildly hypoplastic bilaterally, with prominent bilateral posterior communicating arteries. PCAs widely patent to their distal aspects. No aneurysm or vascular malformation. IMPRESSION: 1. Normal MRI of the brain. No acute intracranial process identified. 2. Normal intracranial MRA. Electronically Signed   By: Jeannine Boga M.D.   On: 03/16/2016 00:30   Mr Virgel Paling (cerebral Arteries)  Result Date: 03/16/2016 CLINICAL DATA:  Initial evaluation for acute syncope, headache. EXAM: MRI HEAD WITHOUT CONTRAST MRA HEAD WITHOUT CONTRAST TECHNIQUE: Multiplanar, multiecho pulse sequences of the brain and surrounding structures were obtained without intravenous contrast. Angiographic images of the head were obtained using MRA technique without contrast. COMPARISON:  Prior CT from earlier the same day. FINDINGS: MRI HEAD FINDINGS Brain: Cerebral volume is within normal limits for age. Few tiny T2/FLAIR hyperintense foci noted within the periventricular and deep white matter both cerebral hemispheres, nonspecific, but most likely related to chronic microvascular ischemic changes. This is felt to be within normal limits for age. No abnormal foci of restricted diffusion to suggest acute or subacute ischemia. Gray-white matter differentiation well maintained. No evidence for prior or chronic infarction. A single punctate focus of susceptibility artifact noted within the parasagittal right parietal lobe, likely a tiny chronic microhemorrhage, of doubtful clinical significance. No other evidence for acute or  chronic intracranial hemorrhage. No mass lesion, midline shift or mass effect. Ventricles normal in size without evidence for hydrocephalus. No extra-axial fluid collection. Major dural sinuses are grossly patent. Pituitary gland and suprasellar region within normal limits. Vascular: Major intracranial vascular flow voids are maintained. Skull and upper cervical spine: Craniocervical junction normal. Mild degenerative spondylolysis noted within the visualized upper cervical spine without significant stenosis. Bone marrow signal intensity normal. No scalp soft tissue abnormality. Sinuses/Orbits: Globes and orbital soft tissues within normal limits. Paranasal sinuses and mastoid air cells are clear. Inner ear structures grossly normal. MRA HEAD FINDINGS ANTERIOR  CIRCULATION: Distal cervical segments of the internal carotid arteries are patent with antegrade flow. Petrous, cavernous, and supraclinoid segments are widely patent bilaterally. A1 segments patent. Anterior communicating artery normal. Anterior cerebral arteries widely patent to their distal aspects. M1 segments patent without stenosis or occlusion. MCA bifurcations normal. Distal MCA branches well opacified and symmetric. POSTERIOR CIRCULATION: Overall, vertebrobasilar system mildly diminutive. Left vertebral artery dominant. Vertebral arteries are widely patent to the vertebrobasilar junction. Posterior inferior cerebral arteries patent proximally. Basilar artery widely patent. Superior cerebral arteries patent bilaterally. P1 segments mildly hypoplastic bilaterally, with prominent bilateral posterior communicating arteries. PCAs widely patent to their distal aspects. No aneurysm or vascular malformation. IMPRESSION: 1. Normal MRI of the brain. No acute intracranial process identified. 2. Normal intracranial MRA. Electronically Signed   By: Jeannine Boga M.D.   On: 03/16/2016 00:30    Chart has been reviewed    Assessment/Plan  61 y.o.  female with medical history significant of DM  2 , hypertension, HLD admitted for syncope associated severe headache currently resolved MRI without evidence of CVA   Present on Admission: . Essential hypertension stable continue home medications . Hyperlipidemia stable continue home medications . Syncope will monitor on telemetry echo cardiac enzymes obtain echogram him a rehydrate observe overnight . Headache  - currently improved no evidence of CVA or  intracranial bleeding     Other plan as per orders.  DVT prophylaxis:  SCD    Code Status:  FULL CODE as per patient   Family Communication:   Family  at  Bedside  plan of care was discussed with  Husband   Disposition Plan:    To home once workup is complete and patient is stable                      Consults called: none  Admission status:    obs   Level of care    tele        I have spent a total of 57 min on this admission  Nancy Chandler 03/16/2016, 12:37 AM    [facility]  Pager [phone]   after 2 AM please page floor coverage PA If 7AM-7PM, please contact the day team taking care of the patient  [email or web address]  Password [email or web address]

## 2016-03-15 NOTE — ED Provider Notes (Addendum)
[facility] DEPT Provider Note   CSN: [id number] Arrival date & time: 03/15/16  1929     History   Chief Complaint Chief Complaint  Patient presents with  . Loss of Consciousness    ems pt from home, pt reports 10/10 right sided headache while watching TV got up to go lay down and passed out. no injuries from fall. continued dull headache 4/10    HPI Nancy Chandler is a 61 y.o. female.  Patient brought in by EMS. 61 year old female with near syncope and then syncopal episode which led to her coming in for evaluation. Patient had an episode that was near syncope where she dropped to her knees patient then shortly there after passed out completely. Did not hurt anything with the fall. Events were preceded by right sided head pain that was sharp 3 if still present but now just dull. Associated with low bit of nausea but no vomiting no chest pain no shortness of breath. Patient is followed by [facility]. Past medical history significant for diabetes hypertension.      Past Medical History:  Diagnosis Date  . Abnormal Pap smear of cervix 2010   HPV  . Anxiety   . Depression   . Diabetes mellitus without complication ([place])   . Dyspareunia   . Hyperlipidemia   . Hypertension   . Lichen sclerosus     Patient Active Problem List   Diagnosis Date Noted  . Chest pain 10/13/2014  . GERD (gastroesophageal reflux disease) 10/13/2014  . Diabetes mellitus type 2, controlled ([place]) 09/29/2014  . Hyperlipidemia 12/08/2013  . Lichen sclerosus et atrophicus of the vulva 06/09/2013    Class: Diagnosis of  . Essential hypertension 05/12/2013    Class: History of    Past Surgical History:  Procedure Laterality Date  . COLPOSCOPY  2010  . LYMPH NODE BIOPSY    . TUBAL LIGATION  1990    OB History    Gravida Para Term Preterm AB Living   2 2 2     2    SAB TAB Ectopic Multiple Live Births           2       Home Medications    Prior to Admission medications     Medication Sig Start Date End Date Taking? Authorizing Provider  EPINEPHrine 0.3 mg/0.3 mL IJ SOAJ injection Inject 0.3 mLs (0.3 mg total) into the muscle once. Use in case of allergic reaction emergency 09/28/14  Yes Gay Filler Copland, MD  hydrochlorothiazide (MICROZIDE) 12.5 MG capsule TAKE ONE CAPSULE BY MOUTH DAILY * NEEDS APPOINTMENT* 06/23/15  Yes Jaynee Eagles, PA-C  loratadine (CLARITIN) 10 MG tablet Take 10 mg by mouth daily as needed for allergies.   Yes Historical Provider, MD  metFORMIN (GLUCOPHAGE-XR) 500 MG 24 hr tablet Take 500 mg by mouth every evening. 01/31/16  Yes Historical Provider, MD  ranitidine (ZANTAC) 150 MG tablet TAKE 1 TABLET (150 MG TOTAL) BY MOUTH 2 (TWO) TIMES DAILY. Patient taking differently: TAKE 1 TABLET (150 MG TOTAL) BY MOUTH AS NEEDED FOR HEARTBURN 05/03/15  Yes Darreld Mclean, MD    Family History Family History  Problem Relation Age of Onset  . Hypertension Brother   . Cancer Mother     colon  . Hypertension Father   . Alcohol abuse Father   . Diabetes Father   . Heart disease Maternal Grandmother     CHF  . Cancer Maternal Grandfather     kidney  . Heart  disease Paternal Grandmother     CHF  . Breast cancer Paternal Aunt   . Heart disease Paternal Aunt     open heart surgery    Social History Social History  Substance Use Topics  . Smoking status: Never Smoker  . Smokeless tobacco: Never Used  . Alcohol use No     Allergies   Hydrocodone   Review of Systems Review of Systems  Constitutional: Negative for fever.  HENT: Negative for congestion.   Eyes: Positive for visual disturbance.  Respiratory: Negative for shortness of breath.   Cardiovascular: Negative for chest pain.  Gastrointestinal: Positive for nausea. Negative for vomiting.  Genitourinary: Negative for dysuria.  Musculoskeletal: Negative for back pain and neck pain.  Skin: Negative for rash.  Neurological: Positive for syncope and headaches.  Hematological: Does  not bruise/bleed easily.  Psychiatric/Behavioral: Negative for confusion.     Physical Exam Updated Vital Signs BP 147/66   Pulse 72   Temp 97.9 F (36.6 C) (Oral)   Ht 5\' 3"  (1.6 m)   Wt 63.5 kg   LMP 02/21/2008   SpO2 100%   BMI 24.80 kg/m   Physical Exam  Constitutional: She is oriented to person, place, and time. She appears well-developed and well-nourished. No distress.  HENT:  Head: Normocephalic and atraumatic.  Mouth/Throat: Oropharynx is clear and moist.  Eyes: Conjunctivae and EOM are normal. Pupils are equal, round, and reactive to light.  Neck: Normal range of motion. Neck supple.  Cardiovascular: Normal rate, regular rhythm and normal heart sounds.   Pulmonary/Chest: Effort normal and breath sounds normal. No respiratory distress.  Abdominal: Soft. Bowel sounds are normal. There is no tenderness.  Neurological: She is alert and oriented to person, place, and time. No cranial nerve deficit or sensory deficit. She exhibits normal muscle tone. Coordination normal.  Skin: Skin is warm.  Nursing note and vitals reviewed.    ED Treatments / Results  Labs (all labs ordered are listed, but only abnormal results are displayed) Labs Reviewed  BASIC METABOLIC PANEL - Abnormal; Notable for the following:       Result Value   Glucose, Bld 137 (*)    All other components within normal limits  CBC - Abnormal; Notable for the following:    RBC 5.19 (*)    All other components within normal limits  URINALYSIS, ROUTINE W REFLEX MICROSCOPIC - Abnormal; Notable for the following:    Color, Urine STRAW (*)    Hgb urine dipstick SMALL (*)    Squamous Epithelial / LPF 0-5 (*)    All other components within normal limits  CBG MONITORING, ED - Abnormal; Notable for the following:    Glucose-Capillary 156 (*)    All other components within normal limits   Results for orders placed or performed during the hospital encounter of [id number]  Basic metabolic panel  Result Value  Ref Range   Sodium 140 135 - 145 mmol/L   Potassium 3.5 3.5 - 5.1 mmol/L   Chloride 105 101 - 111 mmol/L   CO2 25 22 - 32 mmol/L   Glucose, Bld 137 (H) 65 - 99 mg/dL   BUN 13 6 - 20 mg/dL   Creatinine, Ser 0.81 0.44 - 1.00 mg/dL   Calcium 9.0 8.9 - 10.3 mg/dL   GFR calc non Af Amer >60 >60 mL/min   GFR calc Af Amer >60 >60 mL/min   Anion gap 10 5 - 15  CBC  Result Value Ref Range  WBC 6.0 4.0 - 10.5 K/uL   RBC 5.19 (H) 3.87 - 5.11 MIL/uL   Hemoglobin 13.8 12.0 - 15.0 g/dL   HCT 42.1 36.0 - 46.0 %   MCV 81.1 78.0 - 100.0 fL   MCH 26.6 26.0 - 34.0 pg   MCHC 32.8 30.0 - 36.0 g/dL   RDW 13.9 11.5 - 15.5 %   Platelets 182 150 - 400 K/uL  Urinalysis, Routine w reflex microscopic  Result Value Ref Range   Color, Urine STRAW (A) YELLOW   APPearance CLEAR CLEAR   Specific Gravity, Urine 1.009 1.005 - 1.030   pH 7.0 5.0 - 8.0   Glucose, UA NEGATIVE NEGATIVE mg/dL   Hgb urine dipstick SMALL (A) NEGATIVE   Bilirubin Urine NEGATIVE NEGATIVE   Ketones, ur NEGATIVE NEGATIVE mg/dL   Protein, ur NEGATIVE NEGATIVE mg/dL   Nitrite NEGATIVE NEGATIVE   Leukocytes, UA NEGATIVE NEGATIVE   RBC / HPF 0-5 0 - 5 RBC/hpf   WBC, UA 0-5 0 - 5 WBC/hpf   Bacteria, UA NONE SEEN NONE SEEN   Squamous Epithelial / LPF 0-5 (A) NONE SEEN  CBG monitoring, ED  Result Value Ref Range   Glucose-Capillary 156 (H) 65 - 99 mg/dL     EKG  EKG Interpretation  Date/Time:  Wednesday March 15 2016 19:29:41 EST Ventricular Rate:  73 PR Interval:    QRS Duration: 76 QT Interval:  402 QTC Calculation: 443 R Axis:   61 Text Interpretation:  Sinus rhythm Minimal ST elevation, anterior leads No previous ECGs available Confirmed by Ahriana Gunkel  MD, Weylin Plagge [phone]) on 03/15/2016 8:19:59 PM       Radiology Ct Head Wo Contrast  Result Date: 03/15/2016 CLINICAL DATA:  Right-sided headache and 2 syncopal episodes tonight. EXAM: CT HEAD WITHOUT CONTRAST TECHNIQUE: Contiguous axial images were obtained from the base  of the skull through the vertex without intravenous contrast. COMPARISON:  None. FINDINGS: Brain: There is no evidence of acute cortical infarct, intracranial hemorrhage, mass, midline shift, or extra-axial fluid collection. The ventricles and sulci are normal. Vascular: The mild calcified atherosclerosis at the skullbase. No hyperdense vessel. Skull: No fracture or focal osseous lesion. Sinuses/Orbits: Visualized paranasal sinuses and mastoid air cells are clear. Orbits are unremarkable. Other: None. IMPRESSION: No evidence of acute intracranial abnormality. Electronically Signed   By: Logan Bores M.D.   On: 03/15/2016 21:03    Procedures Procedures (including critical care time)  Medications Ordered in ED Medications - No data to display   Initial Impression / Assessment and Plan / ED Course  I have reviewed the triage vital signs and the nursing notes.  Pertinent labs & imaging results that were available during my care of the patient were reviewed by me and considered in my medical decision making (see chart for details).   lab workup here without any significant findings. Not able to explain patient's syncopal episode. CT of the head shows no acute abnormalities. There is no severe head pain now. No injuries from the fall. Labs are normal. Recommend the admission for cardiac monitoring due to the syncope patient's risk factors and her age.  In addition due to the headache localizing get MRI brain and MRA brain for complete workup.  Final Clinical Impressions(s) / ED Diagnoses   Final diagnoses:  Syncope, unspecified syncope type    New Prescriptions New Prescriptions   No medications on file     Fredia Sorrow, MD 03/15/16 2131    Fredia Sorrow, MD 03/15/16 2144

## 2016-03-16 ENCOUNTER — Observation Stay (HOSPITAL_BASED_OUTPATIENT_CLINIC_OR_DEPARTMENT_OTHER): Payer: 59

## 2016-03-16 ENCOUNTER — Observation Stay (HOSPITAL_COMMUNITY)
Admit: 2016-03-16 | Discharge: 2016-03-16 | Disposition: A | Discharge status: Home / Self Care | Payer: 59 | Attending: Internal Medicine | Admitting: Internal Medicine

## 2016-03-16 ENCOUNTER — Encounter (HOSPITAL_COMMUNITY): Payer: Self-pay | Admitting: General Practice

## 2016-03-16 DIAGNOSIS — E119 Type 2 diabetes mellitus without complications: Secondary | ICD-10-CM

## 2016-03-16 DIAGNOSIS — R55 Syncope and collapse: Secondary | ICD-10-CM | POA: Diagnosis not present

## 2016-03-16 DIAGNOSIS — I10 Essential (primary) hypertension: Secondary | ICD-10-CM

## 2016-03-16 DIAGNOSIS — E1143 Type 2 diabetes mellitus with diabetic autonomic (poly)neuropathy: Secondary | ICD-10-CM

## 2016-03-16 DIAGNOSIS — R51 Headache: Secondary | ICD-10-CM

## 2016-03-16 LAB — LIPID PANEL
Cholesterol: 212 mg/dL — ABNORMAL HIGH (ref 0–200)
HDL: 64 mg/dL (ref >40)
LDL Cholesterol: 137 mg/dL — ABNORMAL HIGH (ref 0–99)
Total CHOL/HDL Ratio: 3.3 RATIO
Triglycerides: 56 mg/dL (ref <150)
VLDL: 11 mg/dL (ref 0–40)

## 2016-03-16 LAB — VAS US CAROTID
LEFT ECA DIAS: -9 cm/s
LEFT VERTEBRAL DIAS: -24 cm/s
Left CCA dist dias: -14 cm/s
Left CCA dist sys: -83 cm/s
Left CCA prox dias: 24 cm/s
Left CCA prox sys: 109 cm/s
Left ICA dist dias: -22 cm/s
Left ICA dist sys: -61 cm/s
Left ICA prox dias: -22 cm/s
Left ICA prox sys: -78 cm/s
RIGHT ECA DIAS: -13 cm/s
RIGHT VERTEBRAL DIAS: 9 cm/s
Right CCA prox dias: 22 cm/s
Right CCA prox sys: 95 cm/s
Right cca dist sys: -119 cm/s

## 2016-03-16 LAB — ECHOCARDIOGRAM COMPLETE
E decel time: 173 msec
E/e' ratio: 8.69
FS: 32 % (ref 28–44)
Height: 63 in
IVS/LV PW RATIO, ED: 1.01
LA ID, A-P, ES: 31 mm
LA diam end sys: 31 mm
LA diam index: 1.87 cm/m2
LA vol A4C: 39.5 ml
LA vol index: 27.5 mL/m2
LA vol: 45.7 mL
LV E/e' medial: 8.69
LV E/e'average: 8.69
LV PW d: 10.9 mm — AB (ref 0.6–1.1)
LV e' LATERAL: 10.7 cm/s
LVOT SV: 47 mL
LVOT VTI: 20.7 cm
LVOT area: 2.27 cm2
LVOT diameter: 17 mm
LVOT peak vel: 86.7 cm/s
Lateral S' vel: 11 cm/s
MV Dec: 173
MV Peak grad: 3 mmHg
MV pk A vel: 82 m/s
MV pk E vel: 93 m/s
TAPSE: 22.4 mm
TDI e' lateral: 10.7
TDI e' medial: 10.9
Weight: 2240 oz

## 2016-03-16 LAB — TSH: TSH: 1.081 u[IU]/mL (ref 0.350–4.500)

## 2016-03-16 LAB — COMPREHENSIVE METABOLIC PANEL
ALT: 16 U/L (ref 14–54)
AST: 23 U/L (ref 15–41)
Albumin: 3.3 g/dL — ABNORMAL LOW (ref 3.5–5.0)
Alkaline Phosphatase: 73 U/L (ref 38–126)
Anion gap: 8 (ref 5–15)
BUN: 13 mg/dL (ref 6–20)
CO2: 26 mmol/L (ref 22–32)
Calcium: 9.3 mg/dL (ref 8.9–10.3)
Chloride: 108 mmol/L (ref 101–111)
Creatinine, Ser: 0.86 mg/dL (ref 0.44–1.00)
GFR calc Af Amer: >60 mL/min (ref >60)
GFR calc non Af Amer: >60 mL/min (ref >60)
Glucose, Bld: 99 mg/dL (ref 65–99)
Potassium: 4.1 mmol/L (ref 3.5–5.1)
Sodium: 142 mmol/L (ref 135–145)
Total Bilirubin: 0.8 mg/dL (ref 0.3–1.2)
Total Protein: 6.9 g/dL (ref 6.5–8.1)

## 2016-03-16 LAB — TROPONIN I
Troponin I: <0.03 ng/mL (ref <0.03)
Troponin I: <0.03 ng/mL (ref <0.03)
Troponin I: <0.03 ng/mL (ref <0.03)

## 2016-03-16 LAB — GLUCOSE, CAPILLARY: Glucose-Capillary: 81 mg/dL (ref 65–99)

## 2016-03-16 LAB — CBG MONITORING, ED
Glucose-Capillary: 94 mg/dL (ref 65–99)
Glucose-Capillary: 98 mg/dL (ref 65–99)

## 2016-03-16 MED ORDER — SODIUM CHLORIDE 0.9 % IV SOLN: INTRAVENOUS | Status: DC

## 2016-03-16 MED ORDER — ACETAMINOPHEN 160 MG/5ML PO SOLN: 650.0000 mg | ORAL | Status: DC | PRN

## 2016-03-16 MED ORDER — STROKE: EARLY STAGES OF RECOVERY BOOK
Freq: Once | Status: DC
  Filled 2016-03-16: qty 1

## 2016-03-16 MED ORDER — ASPIRIN 325 MG PO TABS
325.0000 mg | ORAL_TABLET | Freq: Every day | ORAL | Status: DC
  Administered 2016-03-16: 325 mg via ORAL
  Filled 2016-03-16: qty 1

## 2016-03-16 MED ORDER — PNEUMOCOCCAL VAC POLYVALENT 25 MCG/0.5ML IJ INJ: 0.5000 mL | INJECTION | INTRAMUSCULAR | Status: DC

## 2016-03-16 MED ORDER — INSULIN ASPART 100 UNIT/ML ~~LOC~~ SOLN: 0.0000 [IU] | Freq: Every day | SUBCUTANEOUS | Status: DC

## 2016-03-16 MED ORDER — ASPIRIN 300 MG RE SUPP: 300.0000 mg | Freq: Every day | RECTAL | Status: DC

## 2016-03-16 MED ORDER — ACETAMINOPHEN 325 MG PO TABS
650.0000 mg | ORAL_TABLET | ORAL | Status: DC | PRN
  Administered 2016-03-16: 650 mg via ORAL
  Filled 2016-03-16: qty 2

## 2016-03-16 MED ORDER — LORATADINE 10 MG PO TABS: 10.0000 mg | ORAL_TABLET | Freq: Every day | ORAL | Status: DC | PRN

## 2016-03-16 MED ORDER — SODIUM CHLORIDE 0.9% FLUSH
3.0000 mL | Freq: Two times a day (BID) | INTRAVENOUS | Status: DC
  Administered 2016-03-16: 3 mL via INTRAVENOUS

## 2016-03-16 MED ORDER — INSULIN ASPART 100 UNIT/ML ~~LOC~~ SOLN: 0.0000 [IU] | Freq: Three times a day (TID) | SUBCUTANEOUS | Status: DC

## 2016-03-16 MED ORDER — ACETAMINOPHEN 650 MG RE SUPP: 650.0000 mg | RECTAL | Status: DC | PRN

## 2016-03-16 MED ORDER — SENNOSIDES-DOCUSATE SODIUM 8.6-50 MG PO TABS
1.0000 | ORAL_TABLET | Freq: Every evening | ORAL | Status: DC | PRN
  Filled 2016-03-16: qty 1

## 2016-03-16 MED ORDER — PNEUMOCOCCAL VAC POLYVALENT 25 MCG/0.5ML IJ INJ
0.5000 mL | INJECTION | INTRAMUSCULAR | Status: AC | PRN
  Administered 2016-03-16: 0.5 mL via INTRAMUSCULAR
  Filled 2016-03-16: qty 0.5

## 2016-03-16 NOTE — ED Notes (Signed)
Pt to vascular.

## 2016-03-16 NOTE — Progress Notes (Addendum)
D/c instructions reviewed with pt and family at bedside. Copy of instructions and education given to pt. Pt able to teach back and knows to call 911 if any TIA/STROKE like symptoms return. All test negative per MD for stroke. Pt d/c'd via wheelchair with belongings with family, escorted by unit NT. (delay in discharge due to pt wanting to have the pneumonia vaccine before d/c home, vaccine requested from pharmacy x2--not stocked on unit, and pt was scheduled to have it tomorrow, d/c instructions cannot be printed from [facility] until vaccine(s) addressed).

## 2016-03-16 NOTE — ED Notes (Signed)
CBG 98  

## 2016-03-16 NOTE — Discharge Instructions (Signed)
Transient Ischemic Attack A transient ischemic attack (TIA) is a "warning stroke" that causes stroke-like symptoms. Unlike a stroke, a TIA does not cause permanent damage to the brain. The symptoms of a TIA can happen very fast and do not last long. It is important to know the symptoms of a TIA and what to do. This can help prevent a major stroke or death. What are the causes? A TIA is caused by a temporary blockage in an artery in the brain or neck (carotid artery). The blockage does not allow the brain to get the blood supply it needs and can cause different symptoms. The blockage can be caused by either:  A blood clot.  Fatty buildup (plaque) in a neck or brain artery. What increases the risk?  High blood pressure (hypertension).  High cholesterol.  Diabetes mellitus.  Heart disease.  The buildup of plaque in the blood vessels (peripheral artery disease or atherosclerosis).  The buildup of plaque in the blood vessels that provide blood and oxygen to the brain (carotid artery stenosis).  An abnormal heart rhythm (atrial fibrillation).  Obesity.  Using any tobacco products, including cigarettes, chewing tobacco, or electronic cigarettes.  Taking oral contraceptives, especially in combination with using tobacco.  Physical inactivity.  A diet high in fats, salt (sodium), and calories.  Excessive alcohol use.  Use of illegal drugs (especially cocaine and methamphetamine).  Being female.  Being African American.  Being over the age of 40 years.  Family history of stroke.  Previous history of blood clots, stroke, TIA, or heart attack.  Sickle cell disease. What are the signs or symptoms? TIA symptoms are the same as a stroke but are temporary. These symptoms usually develop suddenly, or may be newly present upon waking from sleep:  Sudden weakness or numbness of the face, arm, or leg, especially on one side of the body.  Sudden trouble walking or difficulty moving arms  or legs.  Sudden confusion.  Sudden personality changes.  Trouble speaking (aphasia) or understanding.  Difficulty swallowing.  Sudden trouble seeing in one or both eyes.  Double vision.  Dizziness.  Loss of balance or coordination.  Sudden severe headache with no known cause.  Trouble reading or writing.  Loss of bowel or bladder control.  Loss of consciousness. How is this diagnosed? Your health care provider may be able to determine the presence or absence of a TIA based on your symptoms, history, and physical exam. CT scan of the brain is usually performed to help identify a TIA. Other tests may include:  Electrocardiography (ECG).  Continuous heart monitoring.  Echocardiography.  Carotid ultrasonography.  MRI.  A scan of the brain circulation.  Blood tests. How is this treated? Since the symptoms of TIA are the same as a stroke, it is important to seek treatment as soon as possible. You may need a medicine to dissolve a blood clot (thrombolytic) if that is the cause of the TIA. This medicine cannot be given if too much time has passed. Treatment may also include:  Rest, oxygen, fluids through an IV tube, and medicines to thin the blood (anticoagulants).  Measures will be taken to prevent short-term and long-term complications, including infection from breathing foreign material into the lungs (aspiration pneumonia), blood clots in the legs, and falls.  Procedures to either remove plaque in the carotid arteries or dilate carotid arteries that have narrowed due to plaque. Those procedures are:  Carotid endarterectomy.  Carotid angioplasty and stenting.  Medicines and diet may  be used to address diabetes, high blood pressure, and other underlying risk factors. Follow these instructions at home:  Take medicines only as directed by your health care provider. Follow the directions carefully. Medicines may be used to control risk factors for a stroke. Be sure  you understand all your medicine instructions.  You may be told to take aspirin or the anticoagulant warfarin. Warfarin needs to be taken exactly as instructed.  Taking too much or too little warfarin is dangerous. Too much warfarin increases the risk of bleeding. Too little warfarin continues to allow the risk for blood clots. While taking warfarin, you will need to have regular blood tests to measure your blood clotting time. A PT blood test measures how long it takes for blood to clot. Your PT is used to calculate another value called an INR. Your PT and INR help your health care provider to adjust your dose of warfarin. The dose can change for many reasons. It is critically important that you take warfarin exactly as prescribed.  Many foods, especially foods high in vitamin K can interfere with warfarin and affect the PT and INR. Foods high in vitamin K include spinach, kale, broccoli, cabbage, collard and turnip greens, Brussels sprouts, peas, cauliflower, seaweed, and parsley, as well as beef and pork liver, green tea, and soybean oil. You should eat a consistent amount of foods high in vitamin K. Avoid major changes in your diet, or notify your health care provider before changing your diet. Arrange a visit with a dietitian to answer your questions.  Many medicines can interfere with warfarin and affect the PT and INR. You must tell your health care provider about any and all medicines you take; this includes all vitamins and supplements. Be especially cautious with aspirin and anti-inflammatory medicines. Do not take or discontinue any prescribed or over-the-counter medicine except on the advice of your health care provider or pharmacist.  Warfarin can have side effects, such as excessive bruising or bleeding. You will need to hold pressure over cuts for longer than usual. Your health care provider or pharmacist will discuss other potential side effects.  Avoid sports or activities that may cause  injury or bleeding.  Be careful when shaving, flossing your teeth, or handling sharp objects.  Alcohol can change the body's ability to handle warfarin. It is best to avoid alcoholic drinks or consume only very small amounts while taking warfarin. Notify your health care provider if you change your alcohol intake.  Notify your dentist or other health care providers before procedures.  Eat a diet that includes 5 or more servings of fruits and vegetables each day. This may reduce the risk of stroke. Certain diets may be prescribed to address high blood pressure, high cholesterol, diabetes, or obesity.  A diet low in sodium, saturated fat, trans fat, and cholesterol is recommended to manage high blood pressure.  A diet low in saturated fat, trans fat, and cholesterol, and high in fiber may control cholesterol levels.  A controlled-carbohydrate, controlled-sugar diet is recommended to manage diabetes.  A reduced-calorie diet that is low in sodium, saturated fat, trans fat, and cholesterol is recommended to manage obesity.  Maintain a healthy weight.  Stay physically active. It is recommended that you get at least 30 minutes of activity on most or all days.  Do not use any tobacco products, including cigarettes, chewing tobacco, or electronic cigarettes. If you need help quitting, ask your health care provider.  Limit alcohol intake to no more than  to no more than 1 drink per day for nonpregnant women and 2 drinks per day for men. One drink equals 12 ounces of beer, 5 ounces of wine, or 1½ ounces of hard liquor. °· Do not abuse drugs. °· A safe home environment is important to reduce the risk of falls. Your health care provider may arrange for specialists to evaluate your home. Having grab bars in the bedroom and bathroom is often important. Your health care provider may arrange for equipment to be used at home, such as raised toilets and a seat for the shower. °· Follow all instructions for follow-up  with your health care provider. This is very important. This includes any referrals and lab tests. Proper follow-up can prevent a stroke or another TIA from occurring. °How is this prevented? °The risk of a TIA can be decreased by appropriately treating high blood pressure, high cholesterol, diabetes, heart disease, and obesity, and by quitting smoking, limiting alcohol, and staying physically active. °Contact a health care provider if: °· You have personality changes. °· You have difficulty swallowing. °· You are seeing double. °· You have dizziness. °· You have a fever. °Get help right away if: °Any of the following symptoms may represent a serious problem that is an emergency. Do not wait to see if the symptoms will go away. Get medical help right away. Call your local emergency services (911 in [place] Do not drive yourself to the hospital. °· You have sudden weakness or numbness of the face, arm, or leg, especially on one side of the body. °· You have sudden trouble walking or difficulty moving arms or legs. °· You have sudden confusion. °· You have trouble speaking (aphasia) or understanding. °· You have sudden trouble seeing in one or both eyes. °· You have a loss of balance or coordination. °· You have a sudden, severe headache with no known cause. °· You have new chest pain or an irregular heartbeat. °· You have a partial or total loss of consciousness. ° °This information is not intended to replace advice given to you by your health care provider. Make sure you discuss any questions you have with your health care provider. °Document Released: 11/16/2004 Document Revised: 10/11/2015 Document Reviewed: 05/14/2013 °Elsevier Interactive Patient Education © 2017 [employer] ° °

## 2016-03-16 NOTE — Progress Notes (Signed)
VASCULAR LAB PRELIMINARY  PRELIMINARY  PRELIMINARY  PRELIMINARY  Carotid duplex completed.    Preliminary report:  1-395 ICA plaquing. Vertebral artery flow is antegrade.   Sherleen Pangborn, RVT 03/16/2016, 10:59 AM

## 2016-03-16 NOTE — ED Notes (Signed)
Admitting MD at bedside, waiting on echo and VS US carotid, if both normal, pt to go home.

## 2016-03-16 NOTE — Progress Notes (Signed)
PROGRESS NOTE    Nancy Chandler  [id number] DOB: 01/20/1956 DOA: 03/15/2016 PCP: No PCP Per Patient   Brief Narrative:  Patient admitted for work up for near syncope and severe headache. So far her CT head and MRI brain has been negative.    Assessment & Plan:   Active Problems:   Essential hypertension   Hyperlipidemia   Diabetes mellitus type 2, controlled ([place])   Syncope   Headache  1. Right sided occipital headache, improved  -so far CT head and MRI/MRA brain has been negative  -tylenol prn as needed   2. Near Syncope; unknown etiology. Possible autonomic dysfunction as she described feeling this way with abrupt change in position.  -orthostatic negative -CE x 2 neg, TSH pending  -Echo done but results pending -Carotid US done- results pending.  - have advised her to avoid from abrupt change in position especially getting up from a seated or sleeping position.   3. HTN -cont home meds   4. Dm2 -hold metformin  -ISS accuchecks.     DVT prophylaxis: SCDs Code Status: Full Family Communication:  Patient can comprehend well  Disposition Plan: Depends on above mentioned workup. If neg, she can go home.   Consultants:   None  Procedures:   None  Antimicrobials:   None   Subjective: No complaints this morning. States her headache has almost resolved.  Tells me most of her symptoms of lightheadedness occurs with abrupt change in position.   Objective: Vitals:   03/16/16 0730 03/16/16 0830 03/16/16 0901 03/16/16 1049  BP: 132/74 132/69 131/73 131/77  Pulse: 79 85 84 72  Resp: 16 16 18 16   Temp:      TempSrc:      SpO2: 99% 100% 95% 100%  Weight:      Height:       No intake or output data in the 24 hours ending 03/16/16 1139 Filed Weights   03/15/16 1932  Weight: 63.5 kg (140 lb)    Examination:  General exam: Appears calm and comfortable  Respiratory system: Clear to auscultation. Respiratory effort normal. Cardiovascular system: S1 & S2  heard, RRR. No JVD, murmurs, rubs, gallops or clicks. No pedal edema. Gastrointestinal system: Abdomen is nondistended, soft and nontender. No organomegaly or masses felt. Normal bowel sounds heard. Central nervous system: Alert and oriented. No focal neurological deficits. Extremities: Symmetric 5 x 5 power. Skin: No rashes, lesions or ulcers Psychiatry: Judgement and insight appear normal. Mood & affect appropriate.     Data Reviewed:   CBC:  Recent Labs Lab 03/15/16 1940  WBC 6.0  HGB 13.8  HCT 42.1  MCV 81.1  PLT [id number]   Basic Metabolic Panel:  Recent Labs Lab 03/15/16 1940 03/16/16 0719  NA 140 142  K 3.5 4.1  CL 105 108  CO2 25 26  GLUCOSE 137* 99  BUN 13 13  CREATININE 0.81 0.86  CALCIUM 9.0 9.3   GFR: Estimated Creatinine Clearance: 61.6 mL/min (by C-G formula based on SCr of 0.86 mg/dL). Liver Function Tests:  Recent Labs Lab 03/16/16 0719  AST 23  ALT 16  ALKPHOS 73  BILITOT 0.8  PROT 6.9  ALBUMIN 3.3*   No results for input(s): LIPASE, AMYLASE in the last 168 hours. No results for input(s): AMMONIA in the last 168 hours. Coagulation Profile: No results for input(s): INR, PROTIME in the last 168 hours. Cardiac Enzymes:  Recent Labs Lab 03/15/16 2236 03/16/16 0719  TROPONINI <0.03 <0.03   BNP (last  3 results) No results for input(s): PROBNP in the last 8760 hours. HbA1C: No results for input(s): HGBA1C in the last 72 hours. CBG:  Recent Labs Lab 03/15/16 1942 03/16/16 0750  GLUCAP 156* 94   Lipid Profile:  Recent Labs  03/16/16 0718  CHOL 212*  HDL 64  LDLCALC 137*  TRIG 56  CHOLHDL 3.3   Thyroid Function Tests:  Recent Labs  03/16/16 0718  TSH 1.081   Anemia Panel: No results for input(s): VITAMINB12, FOLATE, FERRITIN, TIBC, IRON, RETICCTPCT in the last 72 hours. Sepsis Labs: No results for input(s): PROCALCITON, LATICACIDVEN in the last 168 hours.  No results found for this or any previous visit (from the past  240 hour(s)).       Radiology Studies: Dg Chest 2 View  Result Date: 03/15/2016 CLINICAL DATA:  61 year old female with syncope. EXAM: CHEST  2 VIEW COMPARISON:  Chest radiograph dated 08/18/2014 FINDINGS: The heart size and mediastinal contours are within normal limits. Both lungs are clear. The visualized skeletal structures are unremarkable. IMPRESSION: No active cardiopulmonary disease. Electronically Signed   By: Anner Crete M.D.   On: 03/15/2016 22:19   Ct Head Wo Contrast  Result Date: 03/15/2016 CLINICAL DATA:  Right-sided headache and 2 syncopal episodes tonight. EXAM: CT HEAD WITHOUT CONTRAST TECHNIQUE: Contiguous axial images were obtained from the base of the skull through the vertex without intravenous contrast. COMPARISON:  None. FINDINGS: Brain: There is no evidence of acute cortical infarct, intracranial hemorrhage, mass, midline shift, or extra-axial fluid collection. The ventricles and sulci are normal. Vascular: The mild calcified atherosclerosis at the skullbase. No hyperdense vessel. Skull: No fracture or focal osseous lesion. Sinuses/Orbits: Visualized paranasal sinuses and mastoid air cells are clear. Orbits are unremarkable. Other: None. IMPRESSION: No evidence of acute intracranial abnormality. Electronically Signed   By: Logan Bores M.D.   On: 03/15/2016 21:03   Mr Brain Wo Contrast (neuro Protocol)  Result Date: 03/16/2016 CLINICAL DATA:  Initial evaluation for acute syncope, headache. EXAM: MRI HEAD WITHOUT CONTRAST MRA HEAD WITHOUT CONTRAST TECHNIQUE: Multiplanar, multiecho pulse sequences of the brain and surrounding structures were obtained without intravenous contrast. Angiographic images of the head were obtained using MRA technique without contrast. COMPARISON:  Prior CT from earlier the same day. FINDINGS: MRI HEAD FINDINGS Brain: Cerebral volume is within normal limits for age. Few tiny T2/FLAIR hyperintense foci noted within the periventricular and deep  white matter both cerebral hemispheres, nonspecific, but most likely related to chronic microvascular ischemic changes. This is felt to be within normal limits for age. No abnormal foci of restricted diffusion to suggest acute or subacute ischemia. Gray-white matter differentiation well maintained. No evidence for prior or chronic infarction. A single punctate focus of susceptibility artifact noted within the parasagittal right parietal lobe, likely a tiny chronic microhemorrhage, of doubtful clinical significance. No other evidence for acute or chronic intracranial hemorrhage. No mass lesion, midline shift or mass effect. Ventricles normal in size without evidence for hydrocephalus. No extra-axial fluid collection. Major dural sinuses are grossly patent. Pituitary gland and suprasellar region within normal limits. Vascular: Major intracranial vascular flow voids are maintained. Skull and upper cervical spine: Craniocervical junction normal. Mild degenerative spondylolysis noted within the visualized upper cervical spine without significant stenosis. Bone marrow signal intensity normal. No scalp soft tissue abnormality. Sinuses/Orbits: Globes and orbital soft tissues within normal limits. Paranasal sinuses and mastoid air cells are clear. Inner ear structures grossly normal. MRA HEAD FINDINGS ANTERIOR CIRCULATION: Distal cervical segments of the  internal carotid arteries are patent with antegrade flow. Petrous, cavernous, and supraclinoid segments are widely patent bilaterally. A1 segments patent. Anterior communicating artery normal. Anterior cerebral arteries widely patent to their distal aspects. M1 segments patent without stenosis or occlusion. MCA bifurcations normal. Distal MCA branches well opacified and symmetric. POSTERIOR CIRCULATION: Overall, vertebrobasilar system mildly diminutive. Left vertebral artery dominant. Vertebral arteries are widely patent to the vertebrobasilar junction. Posterior inferior  cerebral arteries patent proximally. Basilar artery widely patent. Superior cerebral arteries patent bilaterally. P1 segments mildly hypoplastic bilaterally, with prominent bilateral posterior communicating arteries. PCAs widely patent to their distal aspects. No aneurysm or vascular malformation. IMPRESSION: 1. Normal MRI of the brain. No acute intracranial process identified. 2. Normal intracranial MRA. Electronically Signed   By: Jeannine Boga M.D.   On: 03/16/2016 00:30   Mr Virgel Paling (cerebral Arteries)  Result Date: 03/16/2016 CLINICAL DATA:  Initial evaluation for acute syncope, headache. EXAM: MRI HEAD WITHOUT CONTRAST MRA HEAD WITHOUT CONTRAST TECHNIQUE: Multiplanar, multiecho pulse sequences of the brain and surrounding structures were obtained without intravenous contrast. Angiographic images of the head were obtained using MRA technique without contrast. COMPARISON:  Prior CT from earlier the same day. FINDINGS: MRI HEAD FINDINGS Brain: Cerebral volume is within normal limits for age. Few tiny T2/FLAIR hyperintense foci noted within the periventricular and deep white matter both cerebral hemispheres, nonspecific, but most likely related to chronic microvascular ischemic changes. This is felt to be within normal limits for age. No abnormal foci of restricted diffusion to suggest acute or subacute ischemia. Gray-white matter differentiation well maintained. No evidence for prior or chronic infarction. A single punctate focus of susceptibility artifact noted within the parasagittal right parietal lobe, likely a tiny chronic microhemorrhage, of doubtful clinical significance. No other evidence for acute or chronic intracranial hemorrhage. No mass lesion, midline shift or mass effect. Ventricles normal in size without evidence for hydrocephalus. No extra-axial fluid collection. Major dural sinuses are grossly patent. Pituitary gland and suprasellar region within normal limits. Vascular: Major  intracranial vascular flow voids are maintained. Skull and upper cervical spine: Craniocervical junction normal. Mild degenerative spondylolysis noted within the visualized upper cervical spine without significant stenosis. Bone marrow signal intensity normal. No scalp soft tissue abnormality. Sinuses/Orbits: Globes and orbital soft tissues within normal limits. Paranasal sinuses and mastoid air cells are clear. Inner ear structures grossly normal. MRA HEAD FINDINGS ANTERIOR CIRCULATION: Distal cervical segments of the internal carotid arteries are patent with antegrade flow. Petrous, cavernous, and supraclinoid segments are widely patent bilaterally. A1 segments patent. Anterior communicating artery normal. Anterior cerebral arteries widely patent to their distal aspects. M1 segments patent without stenosis or occlusion. MCA bifurcations normal. Distal MCA branches well opacified and symmetric. POSTERIOR CIRCULATION: Overall, vertebrobasilar system mildly diminutive. Left vertebral artery dominant. Vertebral arteries are widely patent to the vertebrobasilar junction. Posterior inferior cerebral arteries patent proximally. Basilar artery widely patent. Superior cerebral arteries patent bilaterally. P1 segments mildly hypoplastic bilaterally, with prominent bilateral posterior communicating arteries. PCAs widely patent to their distal aspects. No aneurysm or vascular malformation. IMPRESSION: 1. Normal MRI of the brain. No acute intracranial process identified. 2. Normal intracranial MRA. Electronically Signed   By: Jeannine Boga M.D.   On: 03/16/2016 00:30        Scheduled Meds: .  stroke: mapping our early stages of recovery book   Does not apply Once  . aspirin  300 mg Rectal Daily   Or  . aspirin  325 mg Oral Daily  .  insulin aspart  0-5 Units Subcutaneous QHS  . insulin aspart  0-9 Units Subcutaneous TID WC  . sodium chloride flush  3 mL Intravenous Q12H   Continuous Infusions: . sodium  chloride       LOS: 0 days    Time spent: 35 mins     Ryanna Teschner Arsenio Loader, MD [facility] Pager [phone]   If 7PM-7AM, please contact night-coverage [email or web address] Password [facility] 03/16/2016, 11:39 AM

## 2016-03-16 NOTE — Discharge Summary (Signed)
Physician Discharge Summary  Nancy Chandler [id number] DOB: 12-16-1955 DOA: 03/15/2016  PCP: No PCP Per Patient  Admit date: 03/15/2016 Discharge date: 03/16/2016  Admitted From: Home Disposition: Home  Recommendations for Outpatient Follow-up:  1. Follow up with PCP in 1-2 weeks   Home Health: Yes Equipment/Devices:None Discharge Condition:Stable CODE STATUS:Full Diet recommendation: Heart Healthy / Carb Modified   Brief/Interim Summary: Patient has history of DM2 who came in last night with complaints of severe headache and couple of episodes presyncopal episodes which she states were mostly positional in nature. In the ER she had CT head and Bigelow brain done which were negative. Her headache had significantly improved. Her cardiac enzymes remained negative, TSH was normal, negative orthostatic, Carotid US showed 1-39% stenosis b/l and Echo showed mild LVH otherwise normal.  These episodes of presycope could have been due to dehydration and/or autonomic dysfunction in setting of Dm2. I have advised her to avoid abrupt change in position as it may lead her to feel this way.  At this time she is stable and asymptomatic. She is stable to be discharged.   Discharge Diagnoses:  Active Problems:   Essential hypertension   Hyperlipidemia   Diabetes mellitus type 2, controlled ([place])   Syncope   Headache  1. Right sided occipital headache, improved  -so far CT head and MRI/MRA brain has been negative  -tylenol prn as needed   2. Near Syncope; unknown etiology. Possible autonomic dysfunction as she described feeling this way with abrupt change in position.  -orthostatic negative -CE x 3 neg, TSH WNL -Echo done - mild LVH -Carotid US done- 1-39% b/l stenosis - have advised her to avoid from abrupt change in position especially getting up from a seated or sleeping position.   3. HTN -cont home meds   4. Dm2 -cont home meds     Discharge Instructions   Allergies as of  03/16/2016      Reactions   Hydrocodone Nausea And Vomiting      Medication List    STOP taking these medications   EPINEPHrine 0.3 mg/0.3 mL Soaj injection Commonly known as:  EPI-PEN     TAKE these medications   hydrochlorothiazide 12.5 MG capsule Commonly known as:  MICROZIDE TAKE ONE CAPSULE BY MOUTH DAILY * NEEDS APPOINTMENT*   loratadine 10 MG tablet Commonly known as:  CLARITIN Take 10 mg by mouth daily as needed for allergies.   metFORMIN 500 MG 24 hr tablet Commonly known as:  GLUCOPHAGE-XR Take 500 mg by mouth every evening.   ranitidine 150 MG tablet Commonly known as:  ZANTAC TAKE 1 TABLET (150 MG TOTAL) BY MOUTH 2 (TWO) TIMES DAILY. What changed:  See the new instructions.      Follow-up Information    COPLAND,JESSICA, MD Follow up in 1 week(s).   Specialty:  Family Medicine Contact information: [address] [place] [phone]          Allergies  Allergen Reactions  . Hydrocodone Nausea And Vomiting    Consultations:  None   Procedures/Studies: Dg Chest 2 View  Result Date: 03/15/2016 CLINICAL DATA:  61 year old female with syncope. EXAM: CHEST  2 VIEW COMPARISON:  Chest radiograph dated 08/18/2014 FINDINGS: The heart size and mediastinal contours are within normal limits. Both lungs are clear. The visualized skeletal structures are unremarkable. IMPRESSION: No active cardiopulmonary disease. Electronically Signed   By: Anner Crete M.D.   On: 03/15/2016 22:19   Ct Head Wo Contrast  Result Date: 03/15/2016 CLINICAL DATA:  Right-sided headache and 2 syncopal episodes tonight. EXAM: CT HEAD WITHOUT CONTRAST TECHNIQUE: Contiguous axial images were obtained from the base of the skull through the vertex without intravenous contrast. COMPARISON:  None. FINDINGS: Brain: There is no evidence of acute cortical infarct, intracranial hemorrhage, mass, midline shift, or extra-axial fluid collection. The ventricles and  sulci are normal. Vascular: The mild calcified atherosclerosis at the skullbase. No hyperdense vessel. Skull: No fracture or focal osseous lesion. Sinuses/Orbits: Visualized paranasal sinuses and mastoid air cells are clear. Orbits are unremarkable. Other: None. IMPRESSION: No evidence of acute intracranial abnormality. Electronically Signed   By: Logan Bores M.D.   On: 03/15/2016 21:03   Mr Brain Wo Contrast (neuro Protocol)  Result Date: 03/16/2016 CLINICAL DATA:  Initial evaluation for acute syncope, headache. EXAM: MRI HEAD WITHOUT CONTRAST MRA HEAD WITHOUT CONTRAST TECHNIQUE: Multiplanar, multiecho pulse sequences of the brain and surrounding structures were obtained without intravenous contrast. Angiographic images of the head were obtained using MRA technique without contrast. COMPARISON:  Prior CT from earlier the same day. FINDINGS: MRI HEAD FINDINGS Brain: Cerebral volume is within normal limits for age. Few tiny T2/FLAIR hyperintense foci noted within the periventricular and deep white matter both cerebral hemispheres, nonspecific, but most likely related to chronic microvascular ischemic changes. This is felt to be within normal limits for age. No abnormal foci of restricted diffusion to suggest acute or subacute ischemia. Gray-white matter differentiation well maintained. No evidence for prior or chronic infarction. A single punctate focus of susceptibility artifact noted within the parasagittal right parietal lobe, likely a tiny chronic microhemorrhage, of doubtful clinical significance. No other evidence for acute or chronic intracranial hemorrhage. No mass lesion, midline shift or mass effect. Ventricles normal in size without evidence for hydrocephalus. No extra-axial fluid collection. Major dural sinuses are grossly patent. Pituitary gland and suprasellar region within normal limits. Vascular: Major intracranial vascular flow voids are maintained. Skull and upper cervical spine: Craniocervical  junction normal. Mild degenerative spondylolysis noted within the visualized upper cervical spine without significant stenosis. Bone marrow signal intensity normal. No scalp soft tissue abnormality. Sinuses/Orbits: Globes and orbital soft tissues within normal limits. Paranasal sinuses and mastoid air cells are clear. Inner ear structures grossly normal. MRA HEAD FINDINGS ANTERIOR CIRCULATION: Distal cervical segments of the internal carotid arteries are patent with antegrade flow. Petrous, cavernous, and supraclinoid segments are widely patent bilaterally. A1 segments patent. Anterior communicating artery normal. Anterior cerebral arteries widely patent to their distal aspects. M1 segments patent without stenosis or occlusion. MCA bifurcations normal. Distal MCA branches well opacified and symmetric. POSTERIOR CIRCULATION: Overall, vertebrobasilar system mildly diminutive. Left vertebral artery dominant. Vertebral arteries are widely patent to the vertebrobasilar junction. Posterior inferior cerebral arteries patent proximally. Basilar artery widely patent. Superior cerebral arteries patent bilaterally. P1 segments mildly hypoplastic bilaterally, with prominent bilateral posterior communicating arteries. PCAs widely patent to their distal aspects. No aneurysm or vascular malformation. IMPRESSION: 1. Normal MRI of the brain. No acute intracranial process identified. 2. Normal intracranial MRA. Electronically Signed   By: Jeannine Boga M.D.   On: 03/16/2016 00:30   US Thyroid Biopsy  Result Date: 03/01/2016 INDICATION: Indeterminate bilateral and isthmus thyroid nodules EXAM: ULTRASOUND GUIDED FINE NEEDLE ASPIRATION OF INDETERMINATE BILATERAL AND ISTHMUS THYROID NODULES COMPARISON:  Thyroid ultrasound performed 02/11/2016. MEDICATIONS: None COMPLICATIONS: None immediate. TECHNIQUE: Informed written consent was obtained from the patient after a discussion of the risks, benefits and alternatives to  treatment. Questions regarding the procedure  were encouraged and answered. A timeout was performed prior to the initiation of the procedure. Pre-procedural ultrasound scanning demonstrated unchanged size and appearance of the indeterminate nodules within the right mid, isthmus, and left superior thyroid lobes. The procedure was planned. The neck was prepped in the usual sterile fashion, and a sterile drape was applied covering the operative field. A timeout was performed prior to the initiation of the procedure. Local anesthesia was provided with 1% lidocaine. Under direct ultrasound guidance, 3 FNA biopsies were performed of the isthmus thyroid nodule with a 25 gauge needle. Multiple ultrasound images were saved for procedural documentation purposes. The samples were prepared and submitted to pathology. Under direct ultrasound guidance, 3 FNA biopsies were performed of the right mid thyroid nodule with a 25 gauge needle. Multiple ultrasound images were saved for procedural documentation purposes. The samples were prepared and submitted to pathology. Under direct ultrasound guidance, 3 FNA biopsies were performed of the left superior thyroid nodule with a 25 gauge needle. Multiple ultrasound images were saved for procedural documentation purposes. The samples were prepared and submitted to pathology. Limited post procedural scanning was negative for hematoma or additional complication. Dressings were placed. The patient tolerated the above procedures procedure well without immediate postprocedural complication. FINDINGS: FINDINGS Nodule reference number based on prior diagnostic ultrasound: 1 Maximum size:  2.8 cm Location: Isthmus  ;  Mid ACR TI-RADS total points: 3 ACR TI-RADS risk category:   TR3 (3 points) Prior biopsy:  No Reason for biopsy: meets ACR TI-RADS criteria _________________________________________________________ Nodule reference number based on prior diagnostic ultrasound: 3 Maximum size:  1.5 cm  Location: Right  ;  Mid ACR TI-RADS total points: 6 ACR TI-RADS risk category:   TR4 (4-6 points) Prior biopsy:  No Reason for biopsy: meets ACR TI-RADS criteria Nodule reference number based on prior diagnostic ultrasound: 5 Maximum size:  3.1 cm Location: Left  ;  Superior ACR TI-RADS total points: 3 ACR TI-RADS risk category:  TR3 (3 points) Prior biopsy:  No Reason for biopsy: meets ACR TI-RADS criteria Ultrasound imaging confirms appropriate placement of the needles within the thyroid nodule. IMPRESSION: 1. Technically successful ultrasound guided fine needle aspiration of mid isthmus nodule as described above. 2. Technically successful ultrasound guided fine needle aspiration of right mid thyroid nodule as described above. 3. Technically successful ultrasound guided fine needle aspiration of left superior thyroid nodule as described above. Read by: Ascencion Dike PA-C Electronically Signed   By: Jacqulynn Cadet M.D.   On: 03/01/2016 15:36   US Thyroid Biopsy  Result Date: 03/01/2016 INDICATION: Indeterminate bilateral and isthmus thyroid nodules EXAM: ULTRASOUND GUIDED FINE NEEDLE ASPIRATION OF INDETERMINATE BILATERAL AND ISTHMUS THYROID NODULES COMPARISON:  Thyroid ultrasound performed 02/11/2016. MEDICATIONS: None COMPLICATIONS: None immediate. TECHNIQUE: Informed written consent was obtained from the patient after a discussion of the risks, benefits and alternatives to treatment. Questions regarding the procedure were encouraged and answered. A timeout was performed prior to the initiation of the procedure. Pre-procedural ultrasound scanning demonstrated unchanged size and appearance of the indeterminate nodules within the right mid, isthmus, and left superior thyroid lobes. The procedure was planned. The neck was prepped in the usual sterile fashion, and a sterile drape was applied covering the operative field. A timeout was performed prior to the initiation of the procedure. Local anesthesia was  provided with 1% lidocaine. Under direct ultrasound guidance, 3 FNA biopsies were performed of the isthmus thyroid nodule with a 25 gauge needle. Multiple ultrasound images were saved for procedural  documentation purposes. The samples were prepared and submitted to pathology. Under direct ultrasound guidance, 3 FNA biopsies were performed of the right mid thyroid nodule with a 25 gauge needle. Multiple ultrasound images were saved for procedural documentation purposes. The samples were prepared and submitted to pathology. Under direct ultrasound guidance, 3 FNA biopsies were performed of the left superior thyroid nodule with a 25 gauge needle. Multiple ultrasound images were saved for procedural documentation purposes. The samples were prepared and submitted to pathology. Limited post procedural scanning was negative for hematoma or additional complication. Dressings were placed. The patient tolerated the above procedures procedure well without immediate postprocedural complication. FINDINGS: FINDINGS Nodule reference number based on prior diagnostic ultrasound: 1 Maximum size:  2.8 cm Location: Isthmus  ;  Mid ACR TI-RADS total points: 3 ACR TI-RADS risk category:   TR3 (3 points) Prior biopsy:  No Reason for biopsy: meets ACR TI-RADS criteria _________________________________________________________ Nodule reference number based on prior diagnostic ultrasound: 3 Maximum size:  1.5 cm Location: Right  ;  Mid ACR TI-RADS total points: 6 ACR TI-RADS risk category:   TR4 (4-6 points) Prior biopsy:  No Reason for biopsy: meets ACR TI-RADS criteria Nodule reference number based on prior diagnostic ultrasound: 5 Maximum size:  3.1 cm Location: Left  ;  Superior ACR TI-RADS total points: 3 ACR TI-RADS risk category:  TR3 (3 points) Prior biopsy:  No Reason for biopsy: meets ACR TI-RADS criteria Ultrasound imaging confirms appropriate placement of the needles within the thyroid nodule. IMPRESSION: 1. Technically successful  ultrasound guided fine needle aspiration of mid isthmus nodule as described above. 2. Technically successful ultrasound guided fine needle aspiration of right mid thyroid nodule as described above. 3. Technically successful ultrasound guided fine needle aspiration of left superior thyroid nodule as described above. Read by: Ascencion Dike PA-C Electronically Signed   By: Jacqulynn Cadet M.D.   On: 03/01/2016 15:36   US Thyroid Biopsy  Result Date: 03/01/2016 INDICATION: Indeterminate bilateral and isthmus thyroid nodules EXAM: ULTRASOUND GUIDED FINE NEEDLE ASPIRATION OF INDETERMINATE BILATERAL AND ISTHMUS THYROID NODULES COMPARISON:  Thyroid ultrasound performed 02/11/2016. MEDICATIONS: None COMPLICATIONS: None immediate. TECHNIQUE: Informed written consent was obtained from the patient after a discussion of the risks, benefits and alternatives to treatment. Questions regarding the procedure were encouraged and answered. A timeout was performed prior to the initiation of the procedure. Pre-procedural ultrasound scanning demonstrated unchanged size and appearance of the indeterminate nodules within the right mid, isthmus, and left superior thyroid lobes. The procedure was planned. The neck was prepped in the usual sterile fashion, and a sterile drape was applied covering the operative field. A timeout was performed prior to the initiation of the procedure. Local anesthesia was provided with 1% lidocaine. Under direct ultrasound guidance, 3 FNA biopsies were performed of the isthmus thyroid nodule with a 25 gauge needle. Multiple ultrasound images were saved for procedural documentation purposes. The samples were prepared and submitted to pathology. Under direct ultrasound guidance, 3 FNA biopsies were performed of the right mid thyroid nodule with a 25 gauge needle. Multiple ultrasound images were saved for procedural documentation purposes. The samples were prepared and submitted to pathology. Under direct  ultrasound guidance, 3 FNA biopsies were performed of the left superior thyroid nodule with a 25 gauge needle. Multiple ultrasound images were saved for procedural documentation purposes. The samples were prepared and submitted to pathology. Limited post procedural scanning was negative for hematoma or additional complication. Dressings were placed. The patient tolerated the above procedures  procedure well without immediate postprocedural complication. FINDINGS: FINDINGS Nodule reference number based on prior diagnostic ultrasound: 1 Maximum size:  2.8 cm Location: Isthmus  ;  Mid ACR TI-RADS total points: 3 ACR TI-RADS risk category:   TR3 (3 points) Prior biopsy:  No Reason for biopsy: meets ACR TI-RADS criteria _________________________________________________________ Nodule reference number based on prior diagnostic ultrasound: 3 Maximum size:  1.5 cm Location: Right  ;  Mid ACR TI-RADS total points: 6 ACR TI-RADS risk category:   TR4 (4-6 points) Prior biopsy:  No Reason for biopsy: meets ACR TI-RADS criteria Nodule reference number based on prior diagnostic ultrasound: 5 Maximum size:  3.1 cm Location: Left  ;  Superior ACR TI-RADS total points: 3 ACR TI-RADS risk category:  TR3 (3 points) Prior biopsy:  No Reason for biopsy: meets ACR TI-RADS criteria Ultrasound imaging confirms appropriate placement of the needles within the thyroid nodule. IMPRESSION: 1. Technically successful ultrasound guided fine needle aspiration of mid isthmus nodule as described above. 2. Technically successful ultrasound guided fine needle aspiration of right mid thyroid nodule as described above. 3. Technically successful ultrasound guided fine needle aspiration of left superior thyroid nodule as described above. Read by: Ascencion Dike PA-C Electronically Signed   By: Jacqulynn Cadet M.D.   On: 03/01/2016 15:36   Mr Virgel Paling (cerebral Arteries)  Result Date: 03/16/2016 CLINICAL DATA:  Initial evaluation for acute syncope,  headache. EXAM: MRI HEAD WITHOUT CONTRAST MRA HEAD WITHOUT CONTRAST TECHNIQUE: Multiplanar, multiecho pulse sequences of the brain and surrounding structures were obtained without intravenous contrast. Angiographic images of the head were obtained using MRA technique without contrast. COMPARISON:  Prior CT from earlier the same day. FINDINGS: MRI HEAD FINDINGS Brain: Cerebral volume is within normal limits for age. Few tiny T2/FLAIR hyperintense foci noted within the periventricular and deep white matter both cerebral hemispheres, nonspecific, but most likely related to chronic microvascular ischemic changes. This is felt to be within normal limits for age. No abnormal foci of restricted diffusion to suggest acute or subacute ischemia. Gray-white matter differentiation well maintained. No evidence for prior or chronic infarction. A single punctate focus of susceptibility artifact noted within the parasagittal right parietal lobe, likely a tiny chronic microhemorrhage, of doubtful clinical significance. No other evidence for acute or chronic intracranial hemorrhage. No mass lesion, midline shift or mass effect. Ventricles normal in size without evidence for hydrocephalus. No extra-axial fluid collection. Major dural sinuses are grossly patent. Pituitary gland and suprasellar region within normal limits. Vascular: Major intracranial vascular flow voids are maintained. Skull and upper cervical spine: Craniocervical junction normal. Mild degenerative spondylolysis noted within the visualized upper cervical spine without significant stenosis. Bone marrow signal intensity normal. No scalp soft tissue abnormality. Sinuses/Orbits: Globes and orbital soft tissues within normal limits. Paranasal sinuses and mastoid air cells are clear. Inner ear structures grossly normal. MRA HEAD FINDINGS ANTERIOR CIRCULATION: Distal cervical segments of the internal carotid arteries are patent with antegrade flow. Petrous, cavernous, and  supraclinoid segments are widely patent bilaterally. A1 segments patent. Anterior communicating artery normal. Anterior cerebral arteries widely patent to their distal aspects. M1 segments patent without stenosis or occlusion. MCA bifurcations normal. Distal MCA branches well opacified and symmetric. POSTERIOR CIRCULATION: Overall, vertebrobasilar system mildly diminutive. Left vertebral artery dominant. Vertebral arteries are widely patent to the vertebrobasilar junction. Posterior inferior cerebral arteries patent proximally. Basilar artery widely patent. Superior cerebral arteries patent bilaterally. P1 segments mildly hypoplastic bilaterally, with prominent bilateral posterior communicating arteries. PCAs widely patent to  their distal aspects. No aneurysm or vascular malformation. IMPRESSION: 1. Normal MRI of the brain. No acute intracranial process identified. 2. Normal intracranial MRA. Electronically Signed   By: Jeannine Boga M.D.   On: 03/16/2016 00:30      Subjective:   Discharge Exam: Vitals:   03/16/16 1423 03/16/16 1616  BP: 140/66 135/72  Pulse: 73 73  Resp: 18   Temp: 97.4 F (36.3 C) 97.6 F (36.4 C)   Vitals:   03/16/16 1230 03/16/16 1300 03/16/16 1423 03/16/16 1616  BP: 141/72 140/66 140/66 135/72  Pulse: 69 65 73 73  Resp: 16 14 18    Temp:   97.4 F (36.3 C) 97.6 F (36.4 C)  TempSrc:   Oral Oral  SpO2: 99% 99% 100% 100%  Weight:   62.1 kg (136 lb 14.5 oz)   Height:   5\' 3"  (1.6 m)     General: Nancy Chandler is alert, awake, not in acute distress Cardiovascular: RRR, S1/S2 +, no rubs, no gallops Respiratory: CTA bilaterally, no wheezing, no rhonchi Abdominal: Soft, NT, ND, bowel sounds + Extremities: no edema, no cyanosis    The results of significant diagnostics from this hospitalization (including imaging, microbiology, ancillary and laboratory) are listed below for reference.     Microbiology: No results found for this or any previous visit (from the past  240 hour(s)).   Labs: BNP (last 3 results) No results for input(s): BNP in the last 8760 hours. Basic Metabolic Panel:  Recent Labs Lab 03/15/16 1940 03/16/16 0719  NA 140 142  K 3.5 4.1  CL 105 108  CO2 25 26  GLUCOSE 137* 99  BUN 13 13  CREATININE 0.81 0.86  CALCIUM 9.0 9.3   Liver Function Tests:  Recent Labs Lab 03/16/16 0719  AST 23  ALT 16  ALKPHOS 73  BILITOT 0.8  PROT 6.9  ALBUMIN 3.3*   No results for input(s): LIPASE, AMYLASE in the last 168 hours. No results for input(s): AMMONIA in the last 168 hours. CBC:  Recent Labs Lab 03/15/16 1940  WBC 6.0  HGB 13.8  HCT 42.1  MCV 81.1  PLT 182   Cardiac Enzymes:  Recent Labs Lab 03/15/16 2236 03/16/16 0719 03/16/16 1238  TROPONINI <0.03 <0.03 <0.03   BNP: Invalid input(s): POCBNP CBG:  Recent Labs Lab 03/15/16 1942 03/16/16 0750 03/16/16 1217 03/16/16 1735  GLUCAP 156* 94 98 81   D-Dimer No results for input(s): DDIMER in the last 72 hours. Hgb A1c No results for input(s): HGBA1C in the last 72 hours. Lipid Profile  Recent Labs  03/16/16 0718  CHOL 212*  HDL 64  LDLCALC 137*  TRIG 56  CHOLHDL 3.3   Thyroid function studies  Recent Labs  03/16/16 0718  TSH 1.081   Anemia work up No results for input(s): VITAMINB12, FOLATE, FERRITIN, TIBC, IRON, RETICCTPCT in the last 72 hours. Urinalysis    Component Value Date/Time   COLORURINE STRAW (A) 03/15/2016 2018   APPEARANCEUR CLEAR 03/15/2016 2018   LABSPEC 1.009 03/15/2016 2018   PHURINE 7.0 03/15/2016 2018   GLUCOSEU NEGATIVE 03/15/2016 2018   HGBUR SMALL (A) 03/15/2016 2018   BILIRUBINUR NEGATIVE 03/15/2016 2018   BILIRUBINUR - 02/06/2014 [place] 03/15/2016 2018   PROTEINUR NEGATIVE 03/15/2016 2018   UROBILINOGEN negative 02/06/2014 1048   NITRITE NEGATIVE 03/15/2016 2018   LEUKOCYTESUR NEGATIVE 03/15/2016 2018   Sepsis Labs Invalid input(s): PROCALCITONIN,  WBC,  LACTICIDVEN Microbiology No  results found for this or any  previous visit (from the past 240 hour(s)).   Time coordinating discharge: Over 30 minutes  SIGNED:   Damita Lack, MD  [facility] 03/16/2016, 5:49 PM Pager   If 7PM-7AM, please contact night-coverage [email or web address] Password [email or web address]

## 2016-03-16 NOTE — ED Notes (Signed)
Pt ambulated to the restroom.

## 2016-03-16 NOTE — ED Notes (Signed)
Pt given breakfast tray

## 2016-03-16 NOTE — Progress Notes (Signed)
  Echocardiogram 2D Echocardiogram has been performed.  Jennette Dubin 03/16/2016, 10:31 AM

## 2016-03-20 ENCOUNTER — Encounter: Payer: Self-pay | Admitting: Physical Therapy

## 2016-03-20 ENCOUNTER — Ambulatory Visit: Payer: 59 | Admitting: Physical Therapy

## 2016-03-20 DIAGNOSIS — M25512 Pain in left shoulder: Secondary | ICD-10-CM

## 2016-03-20 DIAGNOSIS — M542 Cervicalgia: Secondary | ICD-10-CM

## 2016-03-20 DIAGNOSIS — M25612 Stiffness of left shoulder, not elsewhere classified: Secondary | ICD-10-CM

## 2016-03-20 DIAGNOSIS — M6281 Muscle weakness (generalized): Secondary | ICD-10-CM

## 2016-03-21 NOTE — Therapy (Addendum)
[place], [place], [address] Phone: [phone]   Fax:  [phone]  Physical Therapy Treatment/ Discharge  Patient Details  Name: Nancy Chandler MRN: [id number] Date of Birth: Apr 07, 1955 Referring Provider: Dr Antony Blackbird   Encounter Date: 03/20/2016      PT End of Session - 03/20/16 1641    Visit Number 5   Number of Visits 16   Date for PT Re-Evaluation 04/05/16   PT Start Time 0430   PT Stop Time 0510   PT Time Calculation (min) 40 min   Activity Tolerance Patient tolerated treatment well   Behavior During Therapy [facility] for tasks assessed/performed      Past Medical History:  Diagnosis Date  . Abnormal Pap smear of cervix 2010   HPV  . Anxiety   . Depression   . Diabetes mellitus without complication ([place])   . Dyspareunia   . Hyperlipidemia   . Hypertension   . Lichen sclerosus     Past Surgical History:  Procedure Laterality Date  . COLPOSCOPY  2010  . LYMPH NODE BIOPSY    . TUBAL LIGATION  1990    There were no vitals filed for this visit.      Subjective Assessment - 03/20/16 1637    Subjective Patient was in the hospital for 3 days with dehydration. She was cleared for stroke and cardiac issues. She reports she is feeling better. She continues to have some pain when she is getting down on her knees. She has had no pain with stairs over the weekend.    Pertinent History DMII   Limitations Lifting;House hold activities   How long can you sit comfortably? N/A   Currently in Pain? No/denies                         [facility] Adult PT Treatment/Exercise - 03/21/16 0001      Knee/Hip Exercises: Standing   Hip Flexion Limitations standing march 2x10    Abduction Limitations 2x10 right 1lb    Extension Limitations 2x10 right 1lb    Other Standing Knee Exercises step onto air-ex and hold      Knee/Hip Exercises: Seated   Long Arc Quad Limitations 2x10 red    Hamstring  Limitations 2x10 red      Knee/Hip Exercises: Supine   Bridges Limitations Bridge with isometric band abduction 3x10    Straight Leg Raises Limitations 3x10 1lb  weight    Other Supine Knee/Hip Exercises supine clamshell 3x10 red                 PT Education - 03/21/16 0818    Education provided Yes   Education Details rationale behind strengthening    Person(s) Educated Patient   Methods Explanation;Demonstration;Handout   Comprehension Verbalized understanding;Returned demonstration;Tactile cues required          PT Short Term Goals - 03/21/16 0820      PT SHORT TERM GOAL #1   Title Patient will increase gross right lower extremity strength to 4+/5    Baseline No pain assess carryover    Time 4   Period Weeks   Status On-going     PT SHORT TERM GOAL #2   Title Patient will be indenpndent with inital HEP    Baseline No radicular pain    Time 4   Period Weeks   Status On-going     PT SHORT TERM GOAL #3  Title Patient will demsotrate a 15 second single leg stance time on the right    Baseline demsotrated initial HEP with minimal cuing    Time 4   Period Weeks   Status On-going           PT Long Term Goals - 02/09/16 0901      PT LONG TERM GOAL #1   Title Patient will go up/down 8 steps without oincreased pain in order to go into her house    Time 8   Period Weeks   Status New     PT LONG TERM GOAL #2   Title Patient will transfer from sit to stand 3x without increased pain    Time 8   Period Weeks   Status New     PT LONG TERM GOAL #3   Title Patient will ambulate 1 mile without increased pain in order to perfrom ADL's    Time [place] - 03/21/16 8341    Clinical Impression Statement Therapy reviewed basic exercises with the patient. She continued to report some fatigue from her expierience last week. She tolerated all exercise perfromed very well. She would benefit from furhter skilled  therapy.    Rehab Potential Good   PT Frequency 2x / week   PT Duration 8 weeks   PT Treatment/Interventions ADLs/Self Care Home Management;Cryotherapy;Electrical Stimulation;Iontophoresis 68m/ml Dexamethasone;Moist Heat;Ultrasound;Therapeutic activities;Therapeutic exercise;Neuromuscular re-education;Patient/family education;Energy conservation;Passive range of motion;Manual techniques;Taping;Dry needling   PT Next Visit Plan assess tolerance to new exercises for her knee. Progress weights with SLR and SAQ as tolerated. Add 4 inch step up.    PT Home Exercise Plan updated for gym exercises and supine    Consulted and Agree with Plan of Care Patient      Patient will benefit from skilled therapeutic intervention in order to improve the following deficits and impairments:  Decreased activity tolerance, Decreased strength, Decreased mobility, Postural dysfunction, Impaired UE functional use, Increased muscle spasms, Decreased range of motion  Visit Diagnosis: Acute pain of left shoulder  Stiffness of left shoulder, not elsewhere classified  Cervicalgia  Muscle weakness (generalized)  PHYSICAL THERAPY DISCHARGE SUMMARY  Visits from Start of Care: 5  Current functional level related to goals / functional outcomes: Was progressing well but did not return after the last visit    Remaining deficits: unknown  Education / Equipment: Unknown  Plan: Patient agrees to discharge.  Patient goals were not met. Patient is being discharged due to not returning since the last visit.  ?????       Problem List Patient Active Problem List   Diagnosis Date Noted  . Syncope 03/15/2016  . Headache 03/15/2016  . Chest pain 10/13/2014  . GERD (gastroesophageal reflux disease) 10/13/2014  . Diabetes mellitus type 2, [place] 09/29/2014  . Hyperlipidemia 12/08/2013  . Lichen sclerosus et atrophicus of the vulva 06/09/2013    Class: Diagnosis of  . Essential hypertension 05/12/2013     Class: History of    DCarney LivingPT DPT  03/21/2016, 8:23 AM  [facility] [address]Phone: [phone]  Fax:  [phone] Name: Nancy VitugMRN: [id number] of Birth: [birth year]

## 2016-05-08 ENCOUNTER — Other Ambulatory Visit: Payer: Self-pay | Admitting: Otolaryngology

## 2016-05-08 DIAGNOSIS — E049 Nontoxic goiter, unspecified: Secondary | ICD-10-CM

## 2016-05-15 ENCOUNTER — Other Ambulatory Visit: Payer: 59

## 2016-05-24 ENCOUNTER — Ambulatory Visit (INDEPENDENT_AMBULATORY_CARE_PROVIDER_SITE_OTHER): Payer: 59 | Admitting: Certified Nurse Midwife

## 2016-05-24 ENCOUNTER — Encounter: Payer: Self-pay | Admitting: Certified Nurse Midwife

## 2016-05-24 VITALS — BP 118/68 | HR 80 | Resp 16 | Ht 60.5 in | Wt 140.0 lb

## 2016-05-24 DIAGNOSIS — N952 Postmenopausal atrophic vaginitis: Secondary | ICD-10-CM | POA: Diagnosis not present

## 2016-05-24 DIAGNOSIS — Z124 Encounter for screening for malignant neoplasm of cervix: Secondary | ICD-10-CM | POA: Diagnosis not present

## 2016-05-24 DIAGNOSIS — N951 Menopausal and female climacteric states: Secondary | ICD-10-CM | POA: Diagnosis not present

## 2016-05-24 DIAGNOSIS — Z01419 Encounter for gynecological examination (general) (routine) without abnormal findings: Secondary | ICD-10-CM

## 2016-05-24 NOTE — Patient Instructions (Addendum)
EXERCISE AND DIET:  We recommended that you start or continue a regular exercise program for good health. Regular exercise means any activity that makes your heart beat faster and makes you sweat.  We recommend exercising at least 30 minutes per day at least 3 days a week, preferably 4 or 5.  We also recommend a diet low in fat and sugar.  Inactivity, poor dietary choices and obesity can cause diabetes, heart attack, stroke, and kidney damage, among others.    ALCOHOL AND SMOKING:  Women should limit their alcohol intake to no more than 7 drinks/beers/glasses of wine (combined, not each!) per week. Moderation of alcohol intake to this level decreases your risk of breast cancer and liver damage. And of course, no recreational drugs are part of a healthy lifestyle.  And absolutely no smoking or even second hand smoke. Most people know smoking can cause heart and lung diseases, but did you know it also contributes to weakening of your bones? Aging of your skin?  Yellowing of your teeth and nails?  CALCIUM AND VITAMIN D:  Adequate intake of calcium and Vitamin D are recommended.  The recommendations for exact amounts of these supplements seem to change often, but generally speaking 600 mg of calcium (either carbonate or citrate) and 800 units of Vitamin D per day seems prudent. Certain women may benefit from higher intake of Vitamin D.  If you are among these women, your doctor will have told you during your visit.    PAP SMEARS:  Pap smears, to check for cervical cancer or precancers,  have traditionally been done yearly, although recent scientific advances have shown that most women can have pap smears less often.  However, every woman still should have a physical exam from her gynecologist every year. It will include a breast check, inspection of the vulva and vagina to check for abnormal growths or skin changes, a visual exam of the cervix, and then an exam to evaluate the size and shape of the uterus and  ovaries.  And after 62 years of age, a rectal exam is indicated to check for rectal cancers. We will also provide age appropriate advice regarding health maintenance, like when you should have certain vaccines, screening for sexually transmitted diseases, bone density testing, colonoscopy, mammograms, etc.   MAMMOGRAMS:  All women over 40 years old should have a yearly mammogram. Many facilities now offer a "3D" mammogram, which may cost around $50 extra out of pocket. If possible,  we recommend you accept the option to have the 3D mammogram performed.  It both reduces the number of women who will be called back for extra views which then turn out to be normal, and it is better than the routine mammogram at detecting truly abnormal areas.    COLONOSCOPY:  Colonoscopy to screen for colon cancer is recommended for all women at age 50.  We know, you hate the idea of the prep.  We agree, BUT, having colon cancer and not knowing it is worse!!  Colon cancer so often starts as a polyp that can be seen and removed at colonscopy, which can quite literally save your life!  And if your first colonoscopy is normal and you have no family history of colon cancer, most women don't have to have it again for 10 years.  Once every ten years, you can do something that may end up saving your life, right?  We will be happy to help you get it scheduled when you are ready.    Be sure to check your insurance coverage so you understand how much it will cost.  It may be covered as a preventative service at no cost, but you should check your particular policy.      Atrophic Vaginitis Atrophic vaginitis is when the tissues that line the vagina become dry and thin. This is caused by a drop in estrogen. Estrogen helps:  To keep the vagina moist.  To make a clear fluid that helps:  To lubricate the vagina for sex.  To protect the vagina from infection. If the lining of the vagina is dry and thin, it may:  Make sex painful. It  may also cause bleeding.  Cause a feeling of:  Burning.  Irritation.  Itchiness.  Make an exam of your vagina painful. It may also cause bleeding.  Make you lose interest in sex.  Cause a burning feeling when you pee.  Make your vaginal fluid (discharge) brown or yellow. For some women, there are no symptoms. This condition is most common in women who do not get their regular menstrual periods anymore (menopause). This often starts when a woman is 65-41 years old. Follow these instructions at home:  Take medicines only as told by your doctor. Do not use any herbal or alternative medicines unless your doctor says it is okay.  Use over-the-counter products for dryness only as told by your doctor. These include:  Creams.  Lubricants.  Moisturizers.  Do not douche.  Do not use products that can make your vagina dry. These include:  Scented feminine sprays.  Scented tampons.  Scented soaps.  If it hurts to have sex, tell your sexual partner. Contact a doctor if:  Your discharge looks different than normal.  Your vagina has an unusual smell.  You have new symptoms.  Your symptoms do not get better with treatment.  Your symptoms get worse. This information is not intended to replace advice given to you by your health care provider. Make sure you discuss any questions you have with your health care provider. Document Released: 07/26/2007 Document Revised: 07/15/2015 Document Reviewed: 01/28/2014 Elsevier Interactive Patient Education  2017 [employer].

## 2016-05-24 NOTE — Progress Notes (Signed)
61 y.o. [id number] Married  African American Fe here for annual exam. Menopausal no HRT. Denies vaginal bleeding or vaginal dryness. Diabetes Type 2, working on diet. Sees Eloise Levels NP now due to PCP leaving. Last Hgb A1-C is 6, checking BS daily and now doing periodically. Feels she is stable. Hypertension stable this am. No health changes today. Retiring on 07/14/16!  Patient's last menstrual period was 02/21/2008.          Sexually active: Yes.    The current method of family planning is post menopausal status.    Exercising: No.  The patient does not participate in regular exercise at present. Smoker:  no  Health Maintenance: Pap:  05-12-13 neg HPV HR neg, 05-19-15 neg HPV HR + 16/18 neg MMG:  05-21-14 category b density birads 2:neg; per patient had MMG done 2017 at [facility], will call for results Colonoscopy:  2014 neg per patient BMD:   2012 TDaP:  10/15 Shingles: none Pneumonia: 2018 Hep C and HIV: Hep c & HIV neg 2016 Labs: PCP takes care of labs Self breast exam:   reports that she has never smoked. She has never used smokeless tobacco. She reports that she does not drink alcohol or use drugs.  Past Medical History:  Diagnosis Date  . Abnormal Pap smear of cervix 2010   HPV  . Anxiety   . Depression   . Diabetes mellitus without complication ([place])   . Dyspareunia   . Hyperlipidemia   . Hypertension   . Lichen sclerosus     Past Surgical History:  Procedure Laterality Date  . COLPOSCOPY  2010  . LYMPH NODE BIOPSY    . TUBAL LIGATION  1990    Current Outpatient Prescriptions  Medication Sig Dispense Refill  . fluticasone (FLONASE) 50 MCG/ACT nasal spray Place into the nose as needed.    . hydrochlorothiazide (MICROZIDE) 12.5 MG capsule TAKE ONE CAPSULE BY MOUTH DAILY * NEEDS APPOINTMENT* 15 capsule 0  . loratadine (CLARITIN) 10 MG tablet Take 10 mg by mouth daily as needed for allergies.    . metFORMIN (GLUCOPHAGE-XR) 500 MG 24 hr tablet Take 500 mg by mouth every  evening.  1  . ranitidine (ZANTAC) 150 MG tablet TAKE 1 TABLET (150 MG TOTAL) BY MOUTH 2 (TWO) TIMES DAILY. (Patient taking differently: TAKE 1 TABLET (150 MG TOTAL) BY MOUTH AS NEEDED FOR HEARTBURN) 60 tablet 0   No current facility-administered medications for this visit.    Facility-Administered Medications Ordered in Other Visits  Medication Dose Route Frequency Provider Last Rate Last Dose  . technetium sestamibi generic (CARDIOLITE) injection 10.1 milli Curie  40.0 millicurie Intravenous Once PRN Carlena Bjornstad, MD        Family History  Problem Relation Age of Onset  . Hypertension Brother   . Cancer Mother     colon  . Hypertension Father   . Alcohol abuse Father   . Diabetes Father   . Heart disease Maternal Grandmother     CHF  . Cancer Maternal Grandfather     kidney  . Heart disease Paternal Grandmother     CHF  . Breast cancer Paternal Aunt   . Heart disease Paternal Aunt     open heart surgery    ROS:  Pertinent items are noted in HPI.  Otherwise, a comprehensive ROS was negative.  Exam:   BP 118/68 (BP Location: Right Arm, Patient Position: Sitting, Cuff Size: Normal)   Pulse 80   Resp 16  Ht 5' 0.5" (1.537 m)   Wt 140 lb (63.5 kg)   LMP 02/21/2008   BMI 26.89 kg/m  Height: 5' 0.5" (153.7 cm) Ht Readings from Last 3 Encounters:  05/24/16 5' 0.5" (1.537 m)  03/16/16 5\' 3"  (1.6 m)  08/03/15 5\' 4"  (1.626 m)    General appearance: alert, cooperative and appears stated age Head: Normocephalic, without obvious abnormality, atraumatic Neck: no adenopathy, supple, symmetrical, trachea midline and thyroid normal to inspection and palpation Lungs: clear to auscultation bilaterally Breasts: normal appearance, no masses or tenderness, No nipple retraction or dimpling, No nipple discharge or bleeding, No axillary or supraclavicular adenopathy Heart: regular rate and rhythm Abdomen: soft, non-tender; no masses,  no organomegaly Extremities: extremities normal,  atraumatic, no cyanosis or edema Skin: Skin color, texture, turgor normal. No rashes or lesions Lymph nodes: Cervical, supraclavicular, and axillary nodes normal. No abnormal inguinal nodes palpated Neurologic: Grossly normal   Pelvic: External genitalia:  no lesions              Urethra:  normal appearing urethra with no masses, tenderness or lesions              Bartholin's and Skene's: normal                 Vagina: normal appearing vagina with normal color and discharge, no lesions              Cervix: no bleeding following Pap, no cervical motion tenderness and no lesions              Pap taken: Yes.   Bimanual Exam:  Uterus:  normal size, contour, position, consistency, mobility, non-tender              Adnexa: normal adnexa and no mass, fullness, tenderness               Rectovaginal: Confirms               Anus:  normal sphincter tone, no lesions  Chaperone present: yes  A:  Well Woman with normal exam  Menopausal no HRT  Atrophic vaginitis  Follow up of abnormal pap today Pap negative + HPVHR with negative genotype  Hypertension/Diabetes Type 2 with PCP management  P:   Reviewed health and wellness pertinent to exam  Aware of need to evaluate if vaginal bleeding  Discussed finding and etiology of atrophic vaginitis. Discussed options of estrogen use vaginally or coconut oil use. Patient does not want Estrogen use. Information and instructions given of coconut oil use. Encouraged nightly use to begin with and may decrease to 3 x weekly. May also use for sexual activity.  Pap smear as above taken if negative will repeat in one year, if not per results.  Continue follow up with PCP as indicated.   counseled on breast self exam, mammography screening, menopause, adequate intake of calcium and vitamin D, diet and exercise  Congratulations on her retirement!  return annually or prn  An After Visit Summary was printed and given to the patient.

## 2016-05-26 ENCOUNTER — Other Ambulatory Visit: Payer: Self-pay | Admitting: Certified Nurse Midwife

## 2016-05-26 DIAGNOSIS — R87612 Low grade squamous intraepithelial lesion on cytologic smear of cervix (LGSIL): Secondary | ICD-10-CM

## 2016-05-26 LAB — IPS PAP TEST WITH HPV

## 2016-05-29 NOTE — Progress Notes (Signed)
Encounter reviewed Jill Jertson, MD   

## 2016-06-06 ENCOUNTER — Encounter: Payer: Self-pay | Admitting: Certified Nurse Midwife

## 2016-06-06 ENCOUNTER — Ambulatory Visit (INDEPENDENT_AMBULATORY_CARE_PROVIDER_SITE_OTHER): Payer: 59 | Admitting: Certified Nurse Midwife

## 2016-06-06 ENCOUNTER — Ambulatory Visit: Payer: Self-pay

## 2016-06-06 DIAGNOSIS — R87612 Low grade squamous intraepithelial lesion on cytologic smear of cervix (LGSIL): Secondary | ICD-10-CM

## 2016-06-06 NOTE — Patient Instructions (Signed)

## 2016-06-06 NOTE — Progress Notes (Signed)
05-19-15 neg HPV HR+ 16/18 neg 05-24-16 LGSIL HPV HR not detected Pt took 800mg  ibuprofen at home at 12:55

## 2016-06-06 NOTE — Progress Notes (Signed)
Patient ID: Nancy Chandler, female   DOB: October 23, 1955, 61 y.o.   MRN: [id number]  Chief Complaint  Patient presents with  . Colposcopy    //jj    HPI Nancy Chandler is a 61 y.o.african american g2p2002 female.  Here for colposcopy exam. Denies vaginal bleeding or vaginal pain. Menopausal. HPI  Indications: Pap smear on May 24 2016 showed: low-grade squamous intraepithelial neoplasia (LGSIL - encompassing HPV,mild dysplasia,CIN I). Previous colposcopy: none. Previous pap smear in 05/19/15 with Negative pap smear and + HPVHR, Genotype 16,18,45 negative Prior cervical treatment: no treatment.  Past Medical History:  Diagnosis Date  . Abnormal Pap smear of cervix    neg HPV HR+ 16/18 neg 2017, 2018 LGSIL HPV-  . Anxiety   . Depression   . Diabetes mellitus without complication ([place])   . Dyspareunia   . Hyperlipidemia   . Hypertension   . Lichen sclerosus     Past Surgical History:  Procedure Laterality Date  . COLPOSCOPY  2010  . LYMPH NODE BIOPSY    . TUBAL LIGATION  1990    Family History  Problem Relation Age of Onset  . Hypertension Brother   . Cancer Mother     colon  . Hypertension Father   . Alcohol abuse Father   . Diabetes Father   . Heart disease Maternal Grandmother     CHF  . Cancer Maternal Grandfather     kidney  . Heart disease Paternal Grandmother     CHF  . Breast cancer Paternal Aunt   . Heart disease Paternal Aunt     open heart surgery    Social History Social History  Substance Use Topics  . Smoking status: Never Smoker  . Smokeless tobacco: Never Used  . Alcohol use No    Allergies  Allergen Reactions  . Hydrocodone Nausea And Vomiting    Current Outpatient Prescriptions  Medication Sig Dispense Refill  . fluticasone (FLONASE) 50 MCG/ACT nasal spray Place into the nose as needed.    . hydrochlorothiazide (MICROZIDE) 12.5 MG capsule TAKE ONE CAPSULE BY MOUTH DAILY * NEEDS APPOINTMENT* 15 capsule 0  . loratadine (CLARITIN) 10 MG tablet  Take 10 mg by mouth daily as needed for allergies.    . metFORMIN (GLUCOPHAGE-XR) 500 MG 24 hr tablet Take 500 mg by mouth every evening.  1  . ranitidine (ZANTAC) 150 MG tablet TAKE 1 TABLET (150 MG TOTAL) BY MOUTH 2 (TWO) TIMES DAILY. (Patient taking differently: TAKE 1 TABLET (150 MG TOTAL) BY MOUTH AS NEEDED FOR HEARTBURN) 60 tablet 0   No current facility-administered medications for this visit.    Facility-Administered Medications Ordered in Other Visits  Medication Dose Route Frequency Provider Last Rate Last Dose  . technetium sestamibi generic (CARDIOLITE) injection 10.1 milli Curie  19.3 millicurie Intravenous Once PRN Carlena Bjornstad, MD        Review of Systems Review of Systems  Constitutional: Negative.   Genitourinary: Negative for pelvic pain, vaginal bleeding, vaginal discharge and vaginal pain.  Skin: Negative.   Psychiatric/Behavioral: Negative.     Blood pressure 120/78, pulse 70, resp. rate 16, height 5' 0.5" (1.537 m), weight 140 lb (63.5 kg), last menstrual period 02/21/2008.  Physical Exam Physical Exam  Constitutional: She is oriented to person, place, and time. She appears well-developed and well-nourished.  Genitourinary: Vagina normal. There is no rash, tenderness or lesion on the right labia. There is no rash, tenderness or lesion on the left labia. Cervix exhibits no discharge.  Neurological: She is alert and oriented to person, place, and time.  Skin: Skin is warm.  Psychiatric: She has a normal mood and affect. Her behavior is normal. Judgment and thought content normal.    Data Reviewed Reviewed pap smear results. Questions addressed regarding procedure.  Assessment    Procedure Details  The risks and benefits of the procedure and Written informed consent obtained.  Speculum placed in vagina and excellent visualization of cervix achieved, cervix swabbed x 3 with saline, acetic acid solution. Acetowhite area noted at 4 o'clock. Cervix viewed  with 3.75,7.5,15#, green filter. Lugol's applied with non staining noted at 4 o'clock also. Biopsy taken at 4 o'clock and ECC taken. Monsel's applied. No active bleeding noted on speculum. Patient tolerated procedure well. Instructions given.  Specimens: 2  Complications: none.     Plan    Specimens labelled and sent to Pathology. Pathology reviewed ECC showed LSIL consistent with HPV changes , no atypia or carcinoma identified. Patient to be called with results and need for pap in one year. Pap recall 08 placed      [facility] 06/06/2016, 2:21 PM

## 2016-06-09 ENCOUNTER — Telehealth: Payer: Self-pay

## 2016-06-09 LAB — IPS OTHER TISSUE BIOPSY

## 2016-06-09 NOTE — Telephone Encounter (Signed)
-----   Message from Regina Eck, CNM sent at 06/09/2016  8:26 AM EDT ----- Notify patient that ECC showed LSIL and changes consistent with HPV, no high grade or carcinoma noted Needs repeat pap smear in one year. Make sure she is consuming folic acid foods in diet which increases her immune status to help with HPVHR 08 recall

## 2016-06-09 NOTE — Telephone Encounter (Signed)
Spoke with patient. Advised patient of message as seen below from [place]. Patient verbalizes understanding of results. Next aex 4/52019. 08 recall placed.  Routing to provider for final review. Patient agreeable to disposition. Will close encounter.

## 2016-06-10 NOTE — Progress Notes (Signed)
Encounter reviewed Karandeep Resende, MD   

## 2017-01-01 NOTE — Telephone Encounter (Signed)
Done

## 2017-03-14 ENCOUNTER — Ambulatory Visit (INDEPENDENT_AMBULATORY_CARE_PROVIDER_SITE_OTHER): Payer: BLUE CROSS/BLUE SHIELD | Admitting: Family Medicine

## 2017-03-14 ENCOUNTER — Encounter: Payer: Self-pay | Admitting: Family Medicine

## 2017-03-14 VITALS — BP 130/84 | HR 68 | Temp 98.0°F | Resp 12 | Ht 63.0 in | Wt 141.5 lb

## 2017-03-14 DIAGNOSIS — K219 Gastro-esophageal reflux disease without esophagitis: Secondary | ICD-10-CM | POA: Diagnosis not present

## 2017-03-14 DIAGNOSIS — I10 Essential (primary) hypertension: Secondary | ICD-10-CM | POA: Diagnosis not present

## 2017-03-14 DIAGNOSIS — E785 Hyperlipidemia, unspecified: Secondary | ICD-10-CM

## 2017-03-14 DIAGNOSIS — Z23 Encounter for immunization: Secondary | ICD-10-CM

## 2017-03-14 DIAGNOSIS — R739 Hyperglycemia, unspecified: Secondary | ICD-10-CM | POA: Diagnosis not present

## 2017-03-14 DIAGNOSIS — E042 Nontoxic multinodular goiter: Secondary | ICD-10-CM | POA: Diagnosis not present

## 2017-03-14 LAB — TSH: TSH: 0.82 u[IU]/mL (ref 0.35–4.50)

## 2017-03-14 LAB — LIPID PANEL
Cholesterol: 222 mg/dL — ABNORMAL HIGH (ref 0–200)
HDL: 64.8 mg/dL (ref >39.00)
LDL Cholesterol: 145 mg/dL — ABNORMAL HIGH (ref 0–99)
NonHDL: 157.66
Total CHOL/HDL Ratio: 3
Triglycerides: 62 mg/dL (ref 0.0–149.0)
VLDL: 12.4 mg/dL (ref 0.0–40.0)

## 2017-03-14 LAB — HEMOGLOBIN A1C: Hgb A1c MFr Bld: 6.2 % (ref 4.6–6.5)

## 2017-03-14 LAB — BASIC METABOLIC PANEL
BUN: 13 mg/dL (ref 6–23)
CO2: 30 mEq/L (ref 19–32)
Calcium: 9.1 mg/dL (ref 8.4–10.5)
Chloride: 106 mEq/L (ref 96–112)
Creatinine, Ser: 0.82 mg/dL (ref 0.40–1.20)
GFR: 90.68 mL/min (ref >60.00)
Glucose, Bld: 108 mg/dL — ABNORMAL HIGH (ref 70–99)
Potassium: 4 mEq/L (ref 3.5–5.1)
Sodium: 141 mEq/L (ref 135–145)

## 2017-03-14 MED ORDER — HYDROCHLOROTHIAZIDE 12.5 MG PO CAPS
12.5000 mg | ORAL_CAPSULE | Freq: Every day | ORAL | 1 refills | Status: DC
Start: 2017-03-14 — End: 2017-09-19

## 2017-03-14 MED ORDER — RANITIDINE HCL 150 MG PO TABS: ORAL_TABLET | ORAL | 1 refills | Status: DC

## 2017-03-14 NOTE — Progress Notes (Signed)
HPI:   [name] is a 62 y.o. female, who is here today to establish care.  Former PCP: Almyra Free Last preventive routine visit: Over a year ago.She had gyn preventive visit in 2018.  Chronic medical problems: "pre diabetes",GERD,HTN,allergic rhinitis, GERD, and thyroid nodules.  On records she has listed DM 2 but she states that she has been diagnosed with prediabetes rather than diabetes.  According to patient she has one provider mention that she had DM 2 and this was in the ER. Occasionally she feels  numbness on tiptoes. She was on Metformin 500 mg daily, which she discontinue almost a year ago.  Lab Results  Component Value Date   HGBA1C 6.1 02/01/2015    Hypertension: Problem was diagnosed in 2006. Currently she is on HCTZ 12.5 mg daily. Last eye exam in 2017. Denies severe/frequent headache, visual changes, chest pain, dyspnea, palpitation, claudication, focal weakness, or edema.   Hyperlipidemia: She is currently on nonpharmacologic treatment. She has been consistent with a healthy, low-fat diet. She exercises regularly, usually walks a few times per week around the mall or at [place].  Lab Results  Component Value Date   CHOL 212 (H) 03/16/2016   HDL 64 03/16/2016   LDLCALC 137 (H) 03/16/2016   TRIG 56 03/16/2016   CHOLHDL 3.3 03/16/2016    Concerns today: Abdominal bloating sensation. According to patient, problem started last week at day after she ate a salad in a local restaurant. Today she had an episode of diarrhea, which she attributes to being "nervous" about the weather. She denies fever, chills, abdominal pain, nausea, vomiting, or blood in the stool.  History of GERD, she is currently on Ranitidine 150 mg twice daily.  Colonoscopy done in 12/2016.   Review of Systems  Constitutional: Negative for activity change, appetite change, fatigue, fever and unexpected weight change.  HENT: Negative for mouth sores, nosebleeds  and trouble swallowing.   Eyes: Negative for redness and visual disturbance.  Respiratory: Negative for cough, shortness of breath and wheezing.   Cardiovascular: Negative for chest pain, palpitations and leg swelling.  Gastrointestinal: Negative for abdominal pain, blood in stool, nausea and vomiting.       Negative for changes in bowel habits.  Endocrine: Negative for cold intolerance, heat intolerance, polydipsia, polyphagia and polyuria.  Genitourinary: Negative for decreased urine volume, dysuria and hematuria.  Skin: Negative for rash and wound.  Allergic/Immunologic: Positive for environmental allergies.  Neurological: Positive for numbness. Negative for syncope, weakness and headaches.  Psychiatric/Behavioral: Negative for confusion. The patient is nervous/anxious.       Current Outpatient Medications on File Prior to Visit  Medication Sig Dispense Refill  . hydrochlorothiazide (MICROZIDE) 12.5 MG capsule TAKE ONE CAPSULE BY MOUTH DAILY * NEEDS APPOINTMENT* 15 capsule 0  . ranitidine (ZANTAC) 150 MG tablet TAKE 1 TABLET (150 MG TOTAL) BY MOUTH 2 (TWO) TIMES DAILY. (Patient taking differently: TAKE 1 TABLET (150 MG TOTAL) BY MOUTH AS NEEDED FOR HEARTBURN) 60 tablet 0  . fluticasone (FLONASE) 50 MCG/ACT nasal spray Place into the nose as needed.    . loratadine (CLARITIN) 10 MG tablet Take 10 mg by mouth daily as needed for allergies.    . metFORMIN (GLUCOPHAGE-XR) 500 MG 24 hr tablet Take 500 mg by mouth every evening.  1   Current Facility-Administered Medications on File Prior to Visit  Medication Dose Route Frequency Provider Last Rate Last Dose  . technetium sestamibi generic (CARDIOLITE) injection 10.1 milli Curie  96.0 millicurie Intravenous Once PRN Carlena Bjornstad, MD         Past Medical History:  Diagnosis Date  . Abnormal Pap smear of cervix    neg HPV HR+ 16/18 neg 2017, 2018 LGSIL HPV-  . Anxiety   . Depression   . Diabetes mellitus without complication ([place])     . Dyspareunia   . History of colon polyps   . Hyperlipidemia   . Hypertension   . Lichen sclerosus   . Thyroid disease    Allergies  Allergen Reactions  . Hydrocodone Nausea And Vomiting    Family History  Problem Relation Age of Onset  . Hypertension Brother   . Hearing loss Brother   . Hyperlipidemia Brother   . Cancer Mother        colon  . Hypertension Father   . Alcohol abuse Father   . Diabetes Father   . Heart disease Maternal Grandmother        CHF  . Arthritis Maternal Grandmother   . Cancer Maternal Grandfather        kidney  . Heart disease Paternal Grandmother        CHF  . Birth defects Daughter   . Breast cancer Paternal Aunt   . Heart disease Paternal Aunt        open heart surgery    Social History   Socioeconomic History  . Marital status: Married    Spouse name: None  . Number of children: 2  . Years of education: None  . Highest education level: None  Social Needs  . Financial resource strain: None  . Food insecurity - worry: None  . Food insecurity - inability: None  . Transportation needs - medical: None  . Transportation needs - non-medical: None  Occupational History  . None  Tobacco Use  . Smoking status: Never Smoker  . Smokeless tobacco: Never Used  Substance and Sexual Activity  . Alcohol use: No    Alcohol/week: 0.0 oz  . Drug use: No  . Sexual activity: Yes    Partners: Male    Birth control/protection: Surgical    Comment: Tubal ligation  Other Topics Concern  . None  Social History Narrative  . None    Vitals:   03/14/17 0734  BP: 130/84  Pulse: 68  Resp: 12  Temp: 98 F (36.7 C)  SpO2: 99%    Body mass index is 25.07 kg/m.  Physical Exam  Nursing note and vitals reviewed. Constitutional: She is oriented to person, place, and time. She appears well-developed and well-nourished. No distress.  HENT:  Head: Normocephalic and atraumatic.  Mouth/Throat: Oropharynx is clear and moist and mucous membranes  are normal.  Eyes: Conjunctivae and EOM are normal. Pupils are equal, round, and reactive to light.  Neck: No tracheal deviation present. Thyromegaly (R-nodule) present.  Cardiovascular: Normal rate and regular rhythm.  No murmur heard. Pulses:      Dorsalis pedis pulses are 2+ on the right side, and 2+ on the left side.  Respiratory: Effort normal and breath sounds normal. No respiratory distress.  GI: Soft. She exhibits no mass. There is no hepatomegaly. There is no tenderness.  Musculoskeletal: She exhibits no edema or tenderness.  Lymphadenopathy:    She has no cervical adenopathy.  Neurological: She is alert and oriented to person, place, and time. She has normal strength. Coordination and gait normal.  Skin: Skin is warm. No rash noted. No erythema.  Psychiatric: She has a  normal mood and affect.  Well groomed, good eye contact.    ASSESSMENT AND PLAN:  Ms. Joan was seen today for establish care.  Diagnoses and all orders for this visit:  Hyperglycemia  She is reporting history of prediabetes rather than DM 2. Further recommendations will be given according to lab results today.  -     Hemoglobin A1c  Essential hypertension  Adequately controlled. No changes in current management. DASH diet recommended. Eye exam recommended annually. F/U in 6 months, before if needed.  -     Basic metabolic panel -     TSH  Hyperlipidemia, unspecified hyperlipidemia type  Continue nonpharmacologic treatment. Further recommendation will be given according to lipid panel.  Multiple thyroid nodules  Asymptomatic. S/P [place] FNA thyroid biopsy in 02/2016. Thyroid US in 01/2016: Multiple bilateral nodules are identified. 2.8 cm nodule in the isthmus, fine-needle aspiration is recommended. 1.5 cm right mid nodule, fine-needle aspiration is recommended 3.1 cm left upper pole nodule, fine-needle aspiration is recommended.  2.2 cm left lower pole nodule, annual follow-up is  recommended.  She has follow-up with ENT, Dr. Constance Holster in 04/2016, dysphagia.  Gastroesophageal reflux disease without esophagitis  Problem is a stable. No changes in current management. Follow-up in 6-12 months.  Need for influenza vaccination -     Flu Vaccine QUAD 36+ mos IM -     Lipid panel     [place] Nick G. [place], MD  [facility]. [place] office.

## 2017-03-14 NOTE — Patient Instructions (Signed)
A few things to remember from today's visit:   Need for influenza vaccination - Plan: Flu Vaccine QUAD 36+ mos IM, Lipid panel  Hyperglycemia - Plan: Hemoglobin A1c  Essential hypertension - Plan: Basic metabolic panel  Hyperlipidemia, unspecified hyperlipidemia type   We have ordered labs or studies at this visit.  It can take up to 1-2 weeks for results and processing. IF results require follow up or explanation, we will call you with instructions. Clinically stable results will be released to your [facility] If you have not heard from [place] or cannot find your results in [facility] in 2 weeks please contact our office at [phone].  If you are not yet signed up for [facility], please consider signing up  Parc stands for "Dietary Approaches to Stop Hypertension." The DASH eating plan is a healthy eating plan that has been shown to reduce high blood pressure (hypertension). It may also reduce your risk for type 2 diabetes, heart disease, and stroke. The DASH eating plan may also help with weight loss. What are tips for following this plan? General guidelines  Avoid eating more than 2,300 mg (milligrams) of salt (sodium) a day. If you have hypertension, you may need to reduce your sodium intake to 1,500 mg a day.  Limit alcohol intake to no more than 1 drink a day for nonpregnant women and 2 drinks a day for men. One drink equals 12 oz of beer, 5 oz of wine, or 1 oz of hard liquor.  Work with your health care provider to maintain a healthy body weight or to lose weight. Ask what an ideal weight is for you.  Get at least 30 minutes of exercise that causes your heart to beat faster (aerobic exercise) most days of the week. Activities may include walking, swimming, or biking.  Work with your health care provider or diet and nutrition specialist (dietitian) to adjust your eating plan to your individual calorie needs. Reading food labels  Check food labels for the amount of  sodium per serving. Choose foods with less than 5 percent of the Daily Value of sodium. Generally, foods with less than 300 mg of sodium per serving fit into this eating plan.  To find whole grains, look for the word "whole" as the first word in the ingredient list. Shopping  Buy products labeled as "low-sodium" or "no salt added."  Buy fresh foods. Avoid canned foods and premade or frozen meals. Cooking  Avoid adding salt when cooking. Use salt-free seasonings or herbs instead of table salt or sea salt. Check with your health care provider or pharmacist before using salt substitutes.  Do not fry foods. Cook foods using healthy methods such as baking, boiling, grilling, and broiling instead.  Cook with heart-healthy oils, such as olive, canola, soybean, or sunflower oil. Meal planning   Eat a balanced diet that includes: ? 5 or more servings of fruits and vegetables each day. At each meal, try to fill half of your plate with fruits and vegetables. ? Up to 6-8 servings of whole grains each day. ? Less than 6 oz of lean meat, poultry, or fish each day. A 3-oz serving of meat is about the same size as a deck of cards. One egg equals 1 oz. ? 2 servings of low-fat dairy each day. ? A serving of nuts, seeds, or beans 5 times each week. ? Heart-healthy fats. Healthy fats called Omega-3 fatty acids are found in foods such as flaxseeds and coldwater fish, like  sardines, salmon, and mackerel.  Limit how much you eat of the following: ? Canned or prepackaged foods. ? Food that is high in trans fat, such as fried foods. ? Food that is high in saturated fat, such as fatty meat. ? Sweets, desserts, sugary drinks, and other foods with added sugar. ? Full-fat dairy products.  Do not salt foods before eating.  Try to eat at least 2 vegetarian meals each week.  Eat more home-cooked food and less restaurant, buffet, and fast food.  When eating at a restaurant, ask that your food be prepared with  less salt or no salt, if possible. What foods are recommended? The items listed may not be a complete list. Talk with your dietitian about what dietary choices are best for you. Grains Whole-grain or whole-wheat bread. Whole-grain or whole-wheat pasta. Brown rice. [place] Morrow. Bulgur. Whole-grain and low-sodium cereals. Pita bread. Low-fat, low-sodium crackers. Whole-wheat flour tortillas. Vegetables Fresh or frozen vegetables (raw, steamed, roasted, or grilled). Low-sodium or reduced-sodium tomato and vegetable juice. Low-sodium or reduced-sodium tomato sauce and tomato paste. Low-sodium or reduced-sodium canned vegetables. Fruits All fresh, dried, or frozen fruit. Canned fruit in natural juice (without added sugar). Meat and other protein foods Skinless chicken or [place]. Ground chicken or [place]. Pork with fat trimmed off. Fish and seafood. Egg whites. Dried beans, peas, or lentils. Unsalted nuts, nut butters, and seeds. Unsalted canned beans. Lean cuts of beef with fat trimmed off. Low-sodium, lean deli meat. Dairy Low-fat (1%) or fat-free (skim) milk. Fat-free, low-fat, or reduced-fat cheeses. Nonfat, low-sodium ricotta or cottage cheese. Low-fat or nonfat yogurt. Low-fat, low-sodium cheese. Fats and oils Soft margarine without trans fats. Vegetable oil. Low-fat, reduced-fat, or light mayonnaise and salad dressings (reduced-sodium). Canola, safflower, olive, soybean, and sunflower oils. Avocado. Seasoning and other foods Herbs. Spices. Seasoning mixes without salt. Unsalted popcorn and pretzels. Fat-free sweets. What foods are not recommended? The items listed may not be a complete list. Talk with your dietitian about what dietary choices are best for you. Grains Baked goods made with fat, such as croissants, muffins, or some breads. Dry pasta or rice meal packs. Vegetables Creamed or fried vegetables. Vegetables in a cheese sauce. Regular canned vegetables (not low-sodium or  reduced-sodium). Regular canned tomato sauce and paste (not low-sodium or reduced-sodium). Regular tomato and vegetable juice (not low-sodium or reduced-sodium). Angie Fava. Olives. Fruits Canned fruit in a light or heavy syrup. Fried fruit. Fruit in cream or butter sauce. Meat and other protein foods Fatty cuts of meat. Ribs. Fried meat. Berniece Salines. Sausage. Bologna and other processed lunch meats. Salami. Fatback. Hotdogs. Bratwurst. Salted nuts and seeds. Canned beans with added salt. Canned or smoked fish. Whole eggs or egg yolks. Chicken or [place] with skin. Dairy Whole or 2% milk, cream, and half-and-half. Whole or full-fat cream cheese. Whole-fat or sweetened yogurt. Full-fat cheese. Nondairy creamers. Whipped toppings. Processed cheese and cheese spreads. Fats and oils Butter. Stick margarine. Lard. Shortening. Ghee. Bacon fat. Tropical oils, such as coconut, palm kernel, or palm oil. Seasoning and other foods Salted popcorn and pretzels. Onion salt, garlic salt, seasoned salt, table salt, and sea salt. Worcestershire sauce. Tartar sauce. Barbecue sauce. Teriyaki sauce. Soy sauce, including reduced-sodium. Steak sauce. Canned and packaged gravies. Fish sauce. Oyster sauce. Cocktail sauce. Horseradish that you find on the shelf. Ketchup. Mustard. Meat flavorings and tenderizers. Bouillon cubes. Hot sauce and Tabasco sauce. Premade or packaged marinades. Premade or packaged taco seasonings. Relishes. Regular salad dressings. Where to find more information:  National Heart, Lung, and Blood Institute: https://wilson-eaton.com/  American Heart Association: www.heart.org Summary  The DASH eating plan is a healthy eating plan that has been shown to reduce high blood pressure (hypertension). It may also reduce your risk for type 2 diabetes, heart disease, and stroke.  With the DASH eating plan, you should limit salt (sodium) intake to 2,300 mg a day. If you have hypertension, you may need to reduce your sodium  intake to 1,500 mg a day.  When on the DASH eating plan, aim to eat more fresh fruits and vegetables, whole grains, lean proteins, low-fat dairy, and heart-healthy fats.  Work with your health care provider or diet and nutrition specialist (dietitian) to adjust your eating plan to your individual calorie needs. This information is not intended to replace advice given to you by your health care provider. Make sure you discuss any questions you have with your health care provider. Document Released: 01/26/2011 Document Revised: 01/31/2016 Document Reviewed: 01/31/2016 Elsevier Interactive Patient Education  [employer].  Please be sure medication list is accurate. If a new problem present, please set up appointment sooner than planned today.

## 2017-03-20 ENCOUNTER — Encounter: Payer: Self-pay | Admitting: Family Medicine

## 2017-03-21 ENCOUNTER — Other Ambulatory Visit: Payer: Self-pay | Admitting: *Deleted

## 2017-03-21 MED ORDER — ATORVASTATIN CALCIUM 20 MG PO TABS: 20.0000 mg | ORAL_TABLET | Freq: Every day | ORAL | 3 refills | Status: DC

## 2017-03-23 ENCOUNTER — Ambulatory Visit: Payer: BLUE CROSS/BLUE SHIELD | Admitting: Family Medicine

## 2017-05-07 ENCOUNTER — Ambulatory Visit
Admission: RE | Admit: 2017-05-07 | Discharge: 2017-05-07 | Disposition: A | Discharge status: Home / Self Care | Payer: BLUE CROSS/BLUE SHIELD | Source: Ambulatory Visit | Attending: Otolaryngology | Admitting: Otolaryngology

## 2017-05-07 DIAGNOSIS — E049 Nontoxic goiter, unspecified: Secondary | ICD-10-CM

## 2017-05-25 ENCOUNTER — Other Ambulatory Visit (HOSPITAL_COMMUNITY)
Admission: RE | Admit: 2017-05-25 | Discharge: 2017-05-25 | Disposition: A | Discharge status: Home / Self Care | Payer: BLUE CROSS/BLUE SHIELD | Source: Ambulatory Visit | Attending: Certified Nurse Midwife | Admitting: Certified Nurse Midwife

## 2017-05-25 ENCOUNTER — Encounter: Payer: Self-pay | Admitting: Certified Nurse Midwife

## 2017-05-25 ENCOUNTER — Other Ambulatory Visit: Payer: Self-pay

## 2017-05-25 ENCOUNTER — Ambulatory Visit (INDEPENDENT_AMBULATORY_CARE_PROVIDER_SITE_OTHER): Payer: BLUE CROSS/BLUE SHIELD | Admitting: Certified Nurse Midwife

## 2017-05-25 VITALS — BP 120/70 | HR 68 | Resp 16 | Ht 63.25 in | Wt 142.0 lb

## 2017-05-25 DIAGNOSIS — Z01411 Encounter for gynecological examination (general) (routine) with abnormal findings: Secondary | ICD-10-CM | POA: Diagnosis not present

## 2017-05-25 DIAGNOSIS — L900 Lichen sclerosus et atrophicus: Secondary | ICD-10-CM

## 2017-05-25 DIAGNOSIS — R87610 Atypical squamous cells of undetermined significance on cytologic smear of cervix (ASC-US): Secondary | ICD-10-CM | POA: Diagnosis not present

## 2017-05-25 DIAGNOSIS — Z124 Encounter for screening for malignant neoplasm of cervix: Secondary | ICD-10-CM | POA: Diagnosis not present

## 2017-05-25 MED ORDER — CLOBETASOL PROPIONATE 0.05 % EX OINT: 1.0000 "application " | TOPICAL_OINTMENT | Freq: Two times a day (BID) | CUTANEOUS | 4 refills | Status: DC

## 2017-05-25 NOTE — Patient Instructions (Signed)
Lichen Sclerosus  Lichen sclerosus is a skin problem. It can happen on any part of the body, but it commonly involves the anal or genital areas. It can cause itching and discomfort in these areas. Treatment can help to control symptoms. When the genital area is affected, getting treatment is important because the condition can cause scarring that may lead to other problems.  What are the causes?  The cause of this condition is not known. It could be the result of an overactive immune system or a lack of certain hormones. Lichen sclerosus is not an infection or a fungus. It is not passed from one person to another (not contagious).  What increases the risk?  This condition is more likely to develop in women, usually after menopause.  What are the signs or symptoms?  Symptoms of this condition include:  · Thin, wrinkled, white areas on the skin.  · Thickened white areas on the skin.  · Red and swollen patches (lesions) on the skin.  · Tears or cracks in the skin.  · Bruising.  · Blood blisters.  · Severe itching.    You may also have pain, itching, or burning with urination. Constipation is also common in people with lichen sclerosus.  How is this diagnosed?  This condition may be diagnosed with a physical exam. In some cases, a tissue sample (biopsy sample) may be removed to be looked at under a microscope.  How is this treated?  This condition is usually treated with medicated creams or ointments (topical steroids) that are applied over the affected areas.  Follow these instructions at home:  · Take over-the-counter and prescription medicines only as told by your health care provider.  · Use creams or ointments as told by your health care provider.  · Do not scratch the affected areas of skin.  · Women should keep the vaginal area as clean and dry as possible.  · Keep all follow-up visits as told by your health care provider. This is important.  Contact a health care provider if:  · You have increasing redness,  swelling, or pain in the affected area.  · You have fluid, blood, or pus coming from the affected area.  · You have new lesions on your skin.  · You have a fever.  · You have pain during sex.  This information is not intended to replace advice given to you by your health care provider. Make sure you discuss any questions you have with your health care provider.  Document Released: 06/29/2010 Document Revised: 07/15/2015 Document Reviewed: 05/04/2014  Elsevier Interactive Patient Education © 2018 Elsevier Inc.

## 2017-05-25 NOTE — Progress Notes (Signed)
62 y.o. G2P2002 Married  African American Fe here for annual exam. Menopausal with hot flashes on occasion and no issues with insomnia. Complaining of  Lichen flare again after a long time and pain with intercourse due to occurrence. Using Olive oil for dryness. Retired now and enjoying.  Sees Dr. Martinique PCP she managing cholesterol, allergies, glucose,GERD medications and labs. Had all thyroid nodules biopsy all negative per patient, will repeat [place] in 6 months. Exercising and now traveling and volunteering. No other health issues today.  Patient's last menstrual period was 02/21/2008.          Sexually active: Yes.    The current method of family planning is btl.    Exercising: Yes.    walking Smoker:  no  Health Maintenance: Pap:  05-19-15 neg HPV HR+ 16/18 neg, 05-24-16 LGSIL HPV HR - History of Abnormal Pap: yes MMG:  2018 waiting on fax Self Breast exams: yes Colonoscopy:  2018 f/u 62yrs BMD:   2012 TDaP:  2015 Shingles: no Pneumonia: 2018 Hep C and HIV: both neg 2016 Labs: if needed   reports that she has never smoked. She has never used smokeless tobacco. She reports that she does not drink alcohol or use drugs.  Past Medical History:  Diagnosis Date  . Abnormal Pap smear of cervix    neg HPV HR+ 16/18 neg 2017, 2018 LGSIL HPV-  . Anxiety   . Depression   . Diabetes mellitus without complication ([place])   . Dyspareunia   . History of colon polyps   . Hyperlipidemia   . Hypertension   . Lichen sclerosus   . Thyroid disease     Past Surgical History:  Procedure Laterality Date  . COLPOSCOPY  2010  . LYMPH NODE BIOPSY    . TUBAL LIGATION  1990    Current Outpatient Medications  Medication Sig Dispense Refill  . atorvastatin (LIPITOR) 20 MG tablet Take 1 tablet (20 mg total) by mouth daily. 90 tablet 3  . fluticasone (FLONASE) 50 MCG/ACT nasal spray Place into the nose as needed.    . hydrochlorothiazide (MICROZIDE) 12.5 MG capsule Take 1 capsule (12.5 mg total) by mouth  daily. 90 capsule 1  . loratadine (CLARITIN) 10 MG tablet Take 10 mg by mouth daily as needed for allergies.    . metFORMIN (GLUCOPHAGE-XR) 500 MG 24 hr tablet Take 500 mg by mouth every evening.  1  . ranitidine (ZANTAC) 150 MG tablet TAKE 1 TABLET (150 MG TOTAL) BY MOUTH AS NEEDED FOR HEARTBURN 180 tablet 1   No current facility-administered medications for this visit.    Facility-Administered Medications Ordered in Other Visits  Medication Dose Route Frequency Provider Last Rate Last Dose  . technetium sestamibi generic (CARDIOLITE) injection 10.1 milli Curie  82.9 millicurie Intravenous Once PRN Carlena Bjornstad, MD        Family History  Problem Relation Age of Onset  . Hypertension Brother   . Hearing loss Brother   . Hyperlipidemia Brother   . Cancer Mother        colon  . Hypertension Father   . Alcohol abuse Father   . Diabetes Father   . Heart disease Maternal Grandmother        CHF  . Arthritis Maternal Grandmother   . Cancer Maternal Grandfather        kidney  . Heart disease Paternal Grandmother        CHF  . Birth defects Daughter   . Breast cancer  Paternal Aunt   . Heart disease Paternal Aunt        open heart surgery    ROS:  Pertinent items are noted in HPI.  Otherwise, a comprehensive ROS was negative.  Exam:   LMP 02/21/2008    Ht Readings from Last 3 Encounters:  03/14/17 5\' 3"  (1.6 m)  06/06/16 5' 0.5" (1.537 m)  05/24/16 5' 0.5" (1.537 m)    General appearance: alert, cooperative and appears stated age Head: Normocephalic, without obvious abnormality, atraumatic Neck: no adenopathy, supple, symmetrical, trachea midline and thyroid nodular Lungs: clear to auscultation bilaterally Breasts: normal appearance, no masses or tenderness, No nipple retraction or dimpling, No nipple discharge or bleeding, No axillary or supraclavicular adenopathy Heart: regular rate and rhythm Abdomen: soft, non-tender; no masses,  no organomegaly Extremities:  extremities normal, atraumatic, no cyanosis or edema Skin: Skin color, texture, turgor normal. No rashes or lesions Lymph nodes: Cervical, supraclavicular, and axillary nodes normal. No abnormal inguinal nodes palpated Neurologic: Grossly normal   Pelvic: External genitalia:  no lesions              Urethra:  normal appearing urethra with no masses, tenderness or lesions              Bartholin's and Skene's: normal                 Vagina: normal appearing vagina with normal color and discharge, no lesions              Cervix: no cervical motion tenderness, no lesions and normal appearance              Pap taken: Yes.   Bimanual Exam:  Uterus:  normal size, contour, position, consistency, mobility, non-tender and anteverted              Adnexa: normal adnexa and no mass, fullness, tenderness               Rectovaginal: Confirms               Anus:  normal sphincter tone, no lesions  Chaperone present: yes  A:  Well Woman with normal exam  Menopausal no HRT  Follow pap smear with history of ASCUS, LSIL neg. HPVHR last pap  Lichen Sclerosis flare   Glucose, cholesterol, allergies management with PCP  Thyroid nodules with endocrine management, biopsies negative  P:   Reviewed health and wellness pertinent to exam  Aware of need to advise if vaginal bleeding   Discussed vaginal finding and need for Clobetasol ointment use again.  Rx Clobetasol ointment see order with instructions  Continue follow up with MD as indicated  Pap smear: yes  Will need repeat one year if negative, if not per results   counseled on breast self exam, mammography screening, feminine hygiene, adequate intake of calcium and vitamin D, diet and exercise  return annually or prn  An After Visit Summary was printed and given to the patient.

## 2017-05-27 IMAGING — CR DG CHEST 2V
2 series · 2 of 2 positions shown · non-contrast
Comparison: 02/04/2013

CLINICAL DATA: Cough and congestion

EXAM:
CHEST - 2 VIEW

[PA]
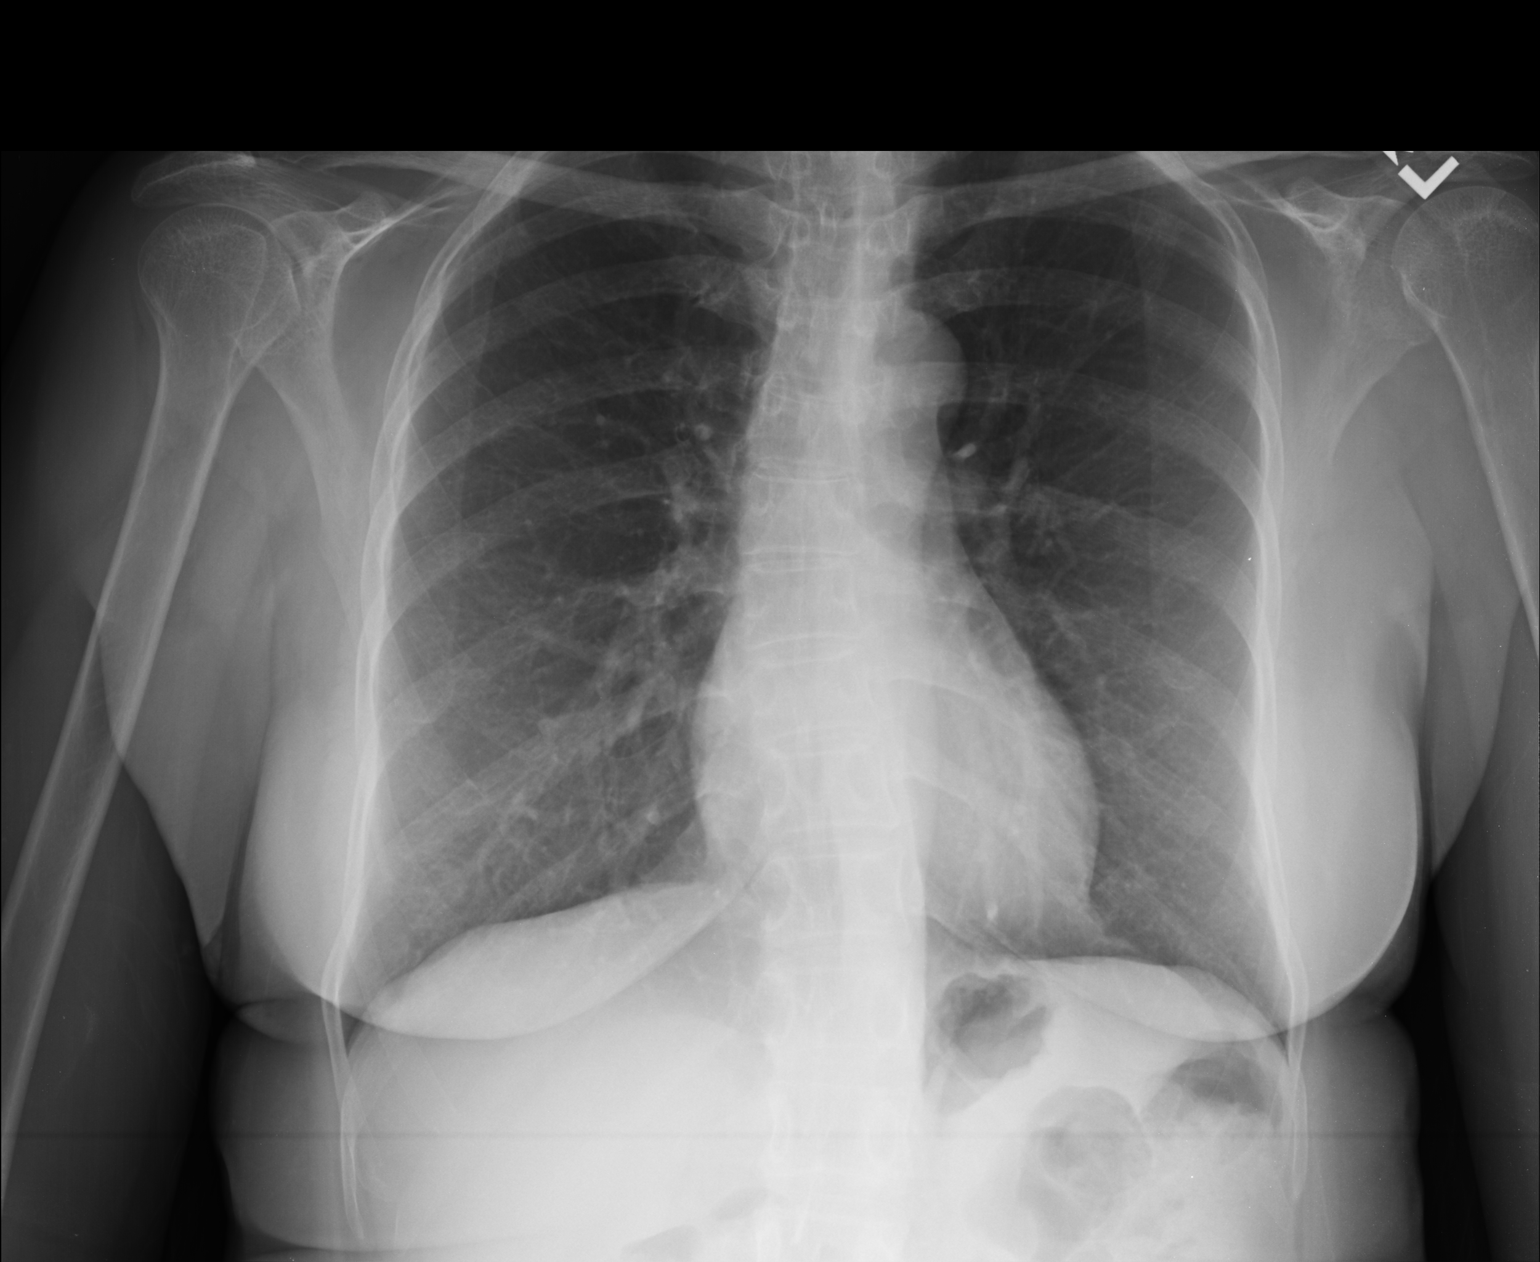

[lateral]
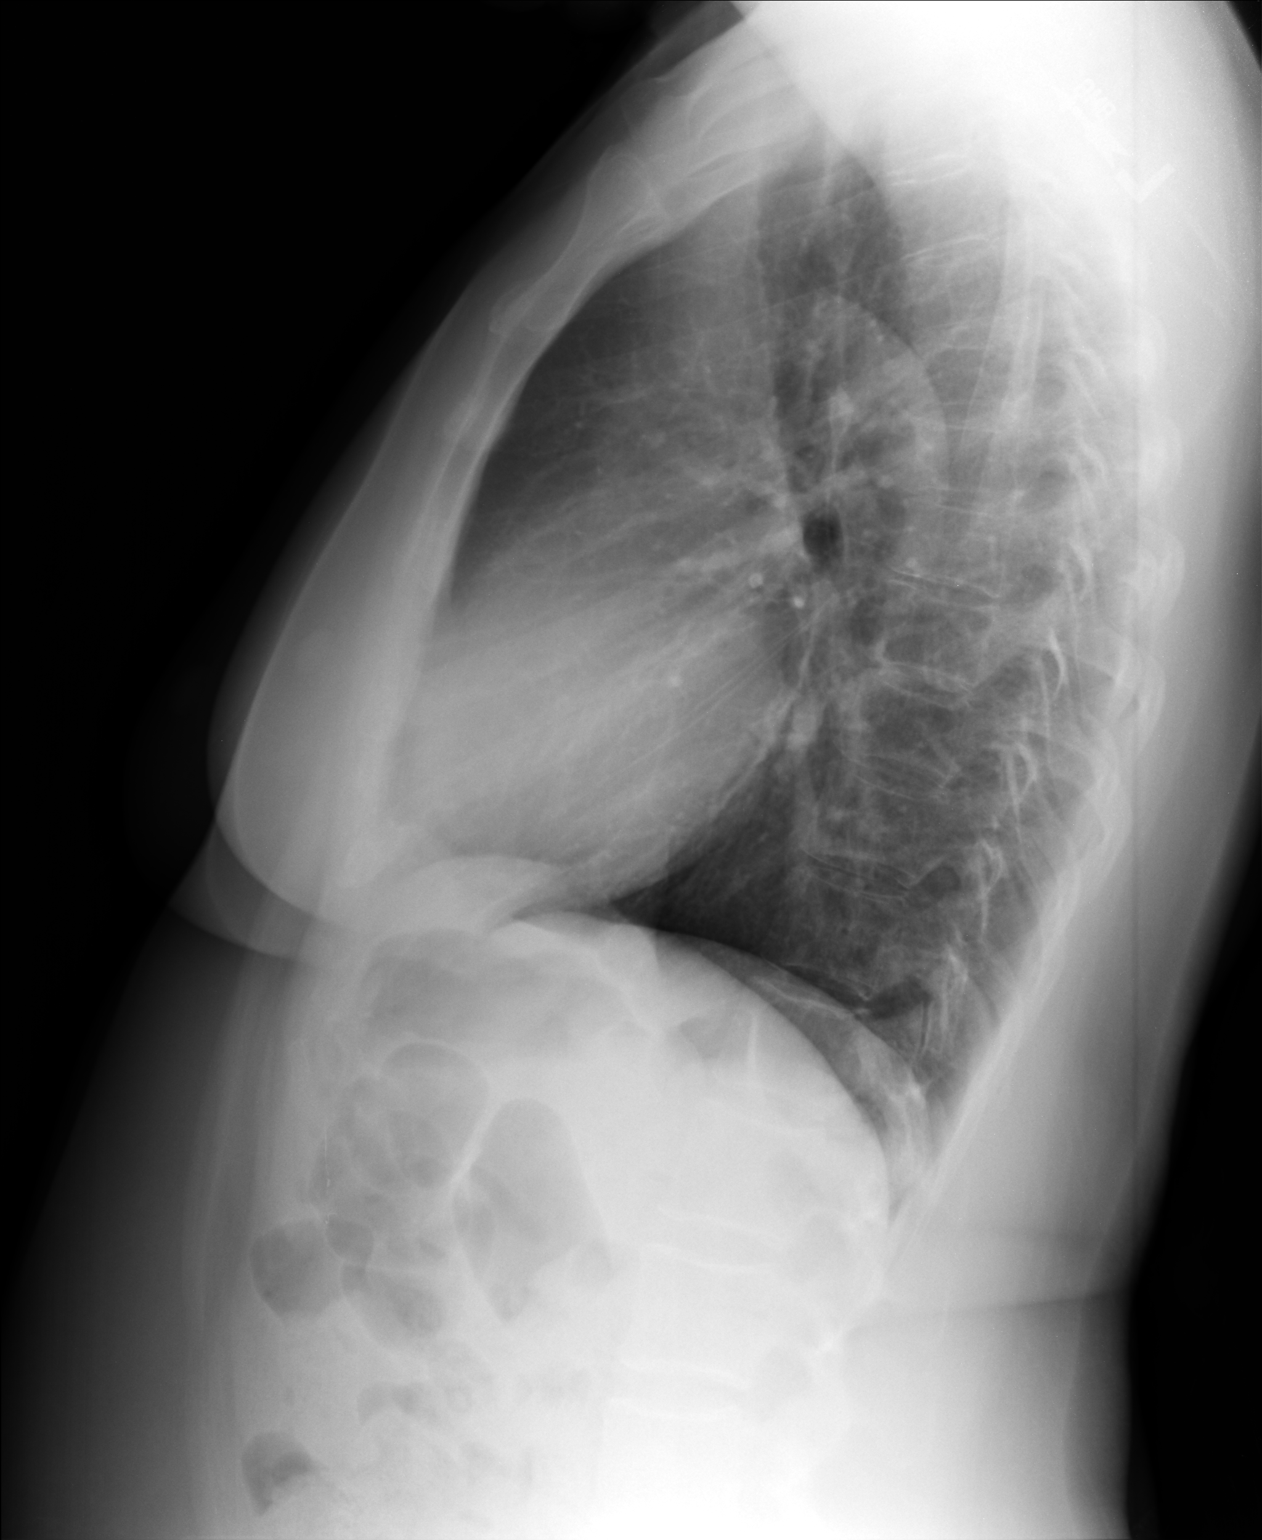

[2 of 2 positions shown; findings below may reference images not displayed]

FINDINGS: The heart size and mediastinal contours are within normal limits.
Both lungs are clear. The visualized skeletal structures are
unremarkable. Bilateral nipple shadows are noted.
IMPRESSION: No active disease.

## 2017-05-30 ENCOUNTER — Other Ambulatory Visit: Payer: Self-pay | Admitting: Certified Nurse Midwife

## 2017-05-30 DIAGNOSIS — R87610 Atypical squamous cells of undetermined significance on cytologic smear of cervix (ASC-US): Secondary | ICD-10-CM

## 2017-05-30 DIAGNOSIS — R87810 Cervical high risk human papillomavirus (HPV) DNA test positive: Principal | ICD-10-CM

## 2017-05-30 LAB — CYTOLOGY - PAP
Diagnosis: UNDETERMINED — AB
HPV: DETECTED — AB

## 2017-06-05 ENCOUNTER — Encounter: Payer: Self-pay | Admitting: Certified Nurse Midwife

## 2017-06-20 ENCOUNTER — Ambulatory Visit (INDEPENDENT_AMBULATORY_CARE_PROVIDER_SITE_OTHER): Payer: BLUE CROSS/BLUE SHIELD | Admitting: Certified Nurse Midwife

## 2017-06-20 ENCOUNTER — Encounter: Payer: Self-pay | Admitting: Certified Nurse Midwife

## 2017-06-20 ENCOUNTER — Other Ambulatory Visit: Payer: Self-pay

## 2017-06-20 DIAGNOSIS — R87610 Atypical squamous cells of undetermined significance on cytologic smear of cervix (ASC-US): Secondary | ICD-10-CM | POA: Diagnosis not present

## 2017-06-20 DIAGNOSIS — R87810 Cervical high risk human papillomavirus (HPV) DNA test positive: Secondary | ICD-10-CM

## 2017-06-20 NOTE — Progress Notes (Addendum)
Patient ID: Nancy Chandler, female   DOB: [birth year], 62 y.o.   MRN: [id number]  Chief Complaint  Patient presents with  . Colposcopy    //jj    HPI Nancy Chandler is a 62 y.o. african [place] female here for colposcopy exam. Denies pelvic pain or vaginal bleeding. Took Ibuprofen and ate lunch as instructed. No problems today.   Indications: Pap smear on May 25, 2017 showed: ASCUS with POSITIVE high risk HPV. Previous colposcopy: in 05/24/16 showed LSIL with colpo showing LSIL. Prior cervical treatment: expectant mangement.  Past Medical History:  Diagnosis Date  . Abnormal Pap smear of cervix    neg HPV HR+ 16/18 neg 2017, 2018 LGSIL HPV-, 2019 ASCUS HPV HR+  . Anxiety   . Depression   . Diabetes mellitus without complication ([place])   . Dyspareunia   . History of colon polyps   . Hyperlipidemia   . Hypertension   . Lichen sclerosus   . Thyroid disease     Past Surgical History:  Procedure Laterality Date  . COLPOSCOPY  2010, 2018  . LYMPH NODE BIOPSY    . TUBAL LIGATION  1990    Family History  Problem Relation Age of Onset  . Hypertension Brother   . Hearing loss Brother   . Hyperlipidemia Brother   . Cancer Mother        colon  . Hypertension Father   . Alcohol abuse Father   . Diabetes Father   . Heart disease Maternal Grandmother        CHF  . Arthritis Maternal Grandmother   . Cancer Maternal Grandfather        kidney  . Heart disease Paternal Grandmother        CHF  . Birth defects Daughter   . Breast cancer Paternal Aunt   . Heart disease Paternal Aunt        open heart surgery    Social History Social History   Tobacco Use  . Smoking status: Never Smoker  . Smokeless tobacco: Never Used  Substance Use Topics  . Alcohol use: No    Alcohol/week: 0.0 oz  . Drug use: No    Allergies  Allergen Reactions  . Hydrocodone Nausea And Vomiting    Current Outpatient Medications  Medication Sig Dispense Refill  . atorvastatin  (LIPITOR) 20 MG tablet Take 1 tablet (20 mg total) by mouth daily. 90 tablet 3  . clobetasol ointment (TEMOVATE) 6.44 % Apply 1 application topically 2 (two) times daily. 60 g 4  . fluticasone (FLONASE) 50 MCG/ACT nasal spray Place into the nose as needed.    . hydrochlorothiazide (MICROZIDE) 12.5 MG capsule Take 1 capsule (12.5 mg total) by mouth daily. 90 capsule 1  . loratadine (CLARITIN) 10 MG tablet Take 10 mg by mouth daily as needed for allergies.    . ranitidine (ZANTAC) 150 MG tablet TAKE 1 TABLET (150 MG TOTAL) BY MOUTH AS NEEDED FOR HEARTBURN 180 tablet 1   No current facility-administered medications for this visit.    Facility-Administered Medications Ordered in Other Visits  Medication Dose Route Frequency Provider Last Rate Last Dose  . technetium sestamibi generic (CARDIOLITE) injection 10.1 milli Curie  03.4 millicurie Intravenous Once PRN Carlena Bjornstad, MD        Review of Systems Review of Systems  Constitutional: Negative.   Gastrointestinal: Negative for abdominal pain.  Genitourinary: Negative for dyspareunia, genital sores, pelvic pain, vaginal bleeding, vaginal discharge and vaginal pain.  Skin:  Negative for pallor.  Psychiatric/Behavioral: The patient is not nervous/anxious.     Blood pressure 118/68, pulse 68, resp. rate 16, height 5' 3.25" (1.607 m), weight 142 lb (64.4 kg), last menstrual period 02/21/2008.  Physical Exam Physical Exam  Constitutional: She is oriented to person, place, and time. She appears well-developed and well-nourished.  Genitourinary: Vagina normal. Pelvic exam was performed with patient supine. There is no rash, tenderness, lesion or injury on the right labia. There is no rash, tenderness, lesion or injury on the left labia. Cervix exhibits no motion tenderness, no discharge and no friability.    Lymphadenopathy: No inguinal adenopathy noted on the right or left side.  Neurological: She is alert and oriented to person, place, and  time.  Skin: Skin is warm and dry.  Psychiatric: She has a normal mood and affect. Her behavior is normal. Judgment and thought content normal.  Vitals reviewed.   Data Reviewed  Reviewed previous pap smear results and 2018 pap with colpo results. Questions addressed.  Assessment    Procedure Details  The risks and benefits of the procedure and Written informed consent obtained.  Speculum placed in vagina and excellent visualization of cervix achieved, cervix swabbed x 3 with saline and acetic acid solution, viewed with 3.75, 7.5, 15 # and green filter. Small acetowhite area noted at 5 o'clock. Lugol's applied with same area noted with non staining. Biopsy taken and ECC obtained. No active bleeding noted on removal of speculum. Monsel's applied. Patient tolerated procedure well. Instructions given verbal and printed.  Specimens: 2  Complications: none.     Plan    Specimens labelled and sent to Pathology. Patient will be called with results when reviewed. Pathology reviewed with benign squamous mucosa noted on cervical biopsy ECC showed benign endocervical cells. Patient will be called with results and will need repeat pap smear in one year.  Pap recall 08 will be placed      [name] 06/20/2017, 4:03 PM

## 2017-06-20 NOTE — Patient Instructions (Signed)

## 2017-06-20 NOTE — Progress Notes (Signed)
05-25-16 ASCUS HPV HR+ 2018 colpo ECC Pt took 400mg  ibuprofen at 12:30pm

## 2017-06-28 ENCOUNTER — Telehealth: Payer: Self-pay | Admitting: Certified Nurse Midwife

## 2017-06-28 NOTE — Telephone Encounter (Signed)
Patient called requesting results.  

## 2017-06-28 NOTE — Telephone Encounter (Signed)
Spoke with patient. Results given. 08 recall placed. Encounter closed.  Notes recorded by Regina Eck, CNM on 06/27/2017 at 7:21 AM EDT Notify patient that her biopsy of cervix showed benign squamous mucosa.  ECC showed benign endocervical cells. No further evaluation at this time.  Repeat pap smear one year Pap recall 08

## 2017-06-29 ENCOUNTER — Ambulatory Visit (INDEPENDENT_AMBULATORY_CARE_PROVIDER_SITE_OTHER): Payer: BLUE CROSS/BLUE SHIELD | Admitting: Certified Nurse Midwife

## 2017-06-29 ENCOUNTER — Encounter: Payer: Self-pay | Admitting: Certified Nurse Midwife

## 2017-06-29 ENCOUNTER — Other Ambulatory Visit: Payer: Self-pay

## 2017-06-29 VITALS — BP 120/78 | HR 68 | Resp 16 | Ht 63.25 in | Wt 142.0 lb

## 2017-06-29 DIAGNOSIS — L900 Lichen sclerosus et atrophicus: Secondary | ICD-10-CM

## 2017-06-29 NOTE — Progress Notes (Signed)
  Subjective:     Patient ID: Nancy Chandler, female   DOB: [birth year], 62 y.o.   MRN: [id number]  62 yo african [place] female here for follow up of Lichen Sclerosis treating with Clobetasol. Patient was seen for aex approximately 6 weeks ago with flare unknown to patient in anogenital area. Previous issues in past with loss of pigmentation related to occurrence. Had noted slight irritation only. Patient was instructed to treat with Clobetasol twice daily for 3 weeks and then once daily for 3 weeks approximately and follow up. Has noted seems totally better. No itching or irritation noted.  No other health issues today.    Review of Systems  Constitutional: Negative.   Gastrointestinal: Negative.   Genitourinary: Negative for genital sores, pelvic pain, vaginal bleeding, vaginal discharge and vaginal pain.  Skin: Negative.   Psychiatric/Behavioral: The patient is not nervous/anxious.        Objective:   Physical Exam  Constitutional: She appears well-developed and well-nourished.  Genitourinary:    Pelvic exam was performed with patient supine. There is no rash, tenderness, lesion or injury on the right labia. There is no rash, tenderness, lesion or injury on the left labia.  Lymphadenopathy: No inguinal adenopathy noted on the right or left side.  Skin: Skin is warm and dry.  Psychiatric: She has a normal mood and affect. Her behavior is normal. Judgment and thought content normal.       Assessment:     Menopausal  Long history of Lichen Sclerosis, recent flare in anogenital area with good response to medication    Plan:     Discussed with patient importance of treating only prescribed duration due to skin thinning with use and starting treatment at onset of symptoms to resolve. If not changing or painful needs to be seen. Questions addressed.  Rv prn, aex

## 2017-06-29 NOTE — Patient Instructions (Signed)
Use medication a  prescribed

## 2017-07-20 ENCOUNTER — Ambulatory Visit (INDEPENDENT_AMBULATORY_CARE_PROVIDER_SITE_OTHER): Payer: BLUE CROSS/BLUE SHIELD | Admitting: Family Medicine

## 2017-07-20 ENCOUNTER — Encounter: Payer: Self-pay | Admitting: Family Medicine

## 2017-07-20 VITALS — BP 120/76 | HR 74 | Temp 97.9°F | Resp 12 | Ht 63.25 in | Wt 142.1 lb

## 2017-07-20 DIAGNOSIS — F419 Anxiety disorder, unspecified: Secondary | ICD-10-CM | POA: Diagnosis not present

## 2017-07-20 DIAGNOSIS — K219 Gastro-esophageal reflux disease without esophagitis: Secondary | ICD-10-CM | POA: Diagnosis not present

## 2017-07-20 DIAGNOSIS — R5383 Other fatigue: Secondary | ICD-10-CM | POA: Diagnosis not present

## 2017-07-20 DIAGNOSIS — G4700 Insomnia, unspecified: Secondary | ICD-10-CM

## 2017-07-20 DIAGNOSIS — E1143 Type 2 diabetes mellitus with diabetic autonomic (poly)neuropathy: Secondary | ICD-10-CM | POA: Diagnosis not present

## 2017-07-20 DIAGNOSIS — R1901 Right upper quadrant abdominal swelling, mass and lump: Secondary | ICD-10-CM

## 2017-07-20 LAB — COMPREHENSIVE METABOLIC PANEL
ALT: 14 U/L (ref 0–35)
AST: 15 U/L (ref 0–37)
Albumin: 3.9 g/dL (ref 3.5–5.2)
Alkaline Phosphatase: 84 U/L (ref 39–117)
BUN: 13 mg/dL (ref 6–23)
CO2: 31 mEq/L (ref 19–32)
Calcium: 9.4 mg/dL (ref 8.4–10.5)
Chloride: 105 mEq/L (ref 96–112)
Creatinine, Ser: 0.88 mg/dL (ref 0.40–1.20)
GFR: 83.49 mL/min (ref >60.00)
Glucose, Bld: 107 mg/dL — ABNORMAL HIGH (ref 70–99)
Potassium: 4.1 mEq/L (ref 3.5–5.1)
Sodium: 144 mEq/L (ref 135–145)
Total Bilirubin: 0.4 mg/dL (ref 0.2–1.2)
Total Protein: 7.2 g/dL (ref 6.0–8.3)

## 2017-07-20 LAB — CBC
HCT: 41.9 % (ref 36.0–46.0)
Hemoglobin: 13.8 g/dL (ref 12.0–15.0)
MCHC: 32.9 g/dL (ref 30.0–36.0)
MCV: 80.8 fl (ref 78.0–100.0)
Platelets: 173 10*3/uL (ref 150.0–400.0)
RBC: 5.18 Mil/uL — ABNORMAL HIGH (ref 3.87–5.11)
RDW: 14.4 % (ref 11.5–15.5)
WBC: 5.5 10*3/uL (ref 4.0–10.5)

## 2017-07-20 LAB — MICROALBUMIN / CREATININE URINE RATIO
Creatinine,U: 177.7 mg/dL
Microalb Creat Ratio: 0.4 mg/g (ref 0.0–30.0)
Microalb, Ur: <0.7 mg/dL (ref 0.0–1.9)

## 2017-07-20 MED ORDER — OMEPRAZOLE 20 MG PO CPDR: 20.0000 mg | DELAYED_RELEASE_CAPSULE | Freq: Every day | ORAL | 3 refills | Status: DC

## 2017-07-20 MED ORDER — TRAZODONE HCL 50 MG PO TABS: 25.0000 mg | ORAL_TABLET | Freq: Every day | ORAL | 1 refills | Status: DC

## 2017-07-20 NOTE — Progress Notes (Signed)
ACUTE VISIT   HPI:  Chief Complaint  Patient presents with  . Lack of energy    started 2 to 3 weeks ago  . Stomach issues    started 3 weeks ago  . UA overdue  . Anxiety    when laying down, started a couple of months ago    Nancy Chandler is a 62 y.o. female, who is here today with a few concerns.  Fatigue for the past couple weeks. No past Hx of fatigue. She states that she feels "slow."  Trouble staying asleep for at least 3 months. She wakes up 2 times at night and has difficulty falling asleep, her "brain will not turn off." OTC sleep aids help sometimes.  Feeling nervous since she came back from vacation, she was in a cruise. She is planning on flying and has never done before.  No crying spells or suicidal thoughts.   "Stomach issues" since 11/2016. Started around the same time she was in a cruise recently,05/2017. "A lot of" gas. Problem seems to be worse at night.  Daily bowel movements, 1-2 times daily. Negative for abdominal pain,vomiting, blood in stool. Mild nausea,exacerbated by food intake.  Mid chest discomfort at night, usually when she eats certain food,hog dogs. Alleviated by Zantac. Occasional heartburn.   She also saw in My Chart that she is due for urine test. DM II is in her problem list,she reported Hx of prediabetes instead.  She has not had microal/Cr ratio.  Denies dysuria,increased urinary frequency, gross hematuria,or decreased urine output.    Review of Systems  Constitutional: Positive for fatigue. Negative for activity change, appetite change, fever and unexpected weight change.  HENT: Negative for mouth sores, sore throat and trouble swallowing.   Respiratory: Negative for cough, shortness of breath and wheezing.   Cardiovascular: Negative for palpitations and leg swelling.  Gastrointestinal: Positive for abdominal pain. Negative for abdominal distention, blood in stool, nausea and vomiting.       No  changes in bowel habits.  Endocrine: Negative for cold intolerance and heat intolerance.  Genitourinary: Negative for decreased urine volume, dysuria and hematuria.  Musculoskeletal: Negative for gait problem and myalgias.  Skin: Negative for pallor and rash.  Neurological: Negative for weakness, numbness and headaches.  Hematological: Negative for adenopathy. Does not bruise/bleed easily.  Psychiatric/Behavioral: Positive for sleep disturbance. Negative for confusion. The patient is nervous/anxious.       Current Outpatient Medications on File Prior to Visit  Medication Sig Dispense Refill  . atorvastatin (LIPITOR) 20 MG tablet Take 1 tablet (20 mg total) by mouth daily. 90 tablet 3  . clobetasol ointment (TEMOVATE) 1.61 % Apply 1 application topically 2 (two) times daily. 60 g 4  . fluticasone (FLONASE) 50 MCG/ACT nasal spray Place into the nose as needed.    . hydrochlorothiazide (MICROZIDE) 12.5 MG capsule Take 1 capsule (12.5 mg total) by mouth daily. 90 capsule 1  . loratadine (CLARITIN) 10 MG tablet Take 10 mg by mouth daily as needed for allergies.    . ranitidine (ZANTAC) 150 MG tablet TAKE 1 TABLET (150 MG TOTAL) BY MOUTH AS NEEDED FOR HEARTBURN 180 tablet 1   Current Facility-Administered Medications on File Prior to Visit  Medication Dose Route Frequency Provider Last Rate Last Dose  . technetium sestamibi generic (CARDIOLITE) injection 10.1 milli Curie  09.6 millicurie Intravenous Once PRN Carlena Bjornstad, MD         Past Medical History:  Diagnosis Date  .  Abnormal Pap smear of cervix    neg HPV HR+ 16/18 neg 2017, 2018 LGSIL HPV-, 2019 ASCUS HPV HR+  . Anxiety   . Depression   . Diabetes mellitus without complication ([place])   . Dyspareunia   . History of colon polyps   . Hyperlipidemia   . Hypertension   . Lichen sclerosus   . Thyroid disease    Allergies  Allergen Reactions  . Hydrocodone Nausea And Vomiting    Social History   Socioeconomic History  .  Marital status: Married    Spouse name: Not on file  . Number of children: 2  . Years of education: Not on file  . Highest education level: Not on file  Occupational History  . Not on file  Social Needs  . Financial resource strain: Not on file  . Food insecurity:    Worry: Not on file    Inability: Not on file  . Transportation needs:    Medical: Not on file    Non-medical: Not on file  Tobacco Use  . Smoking status: Never Smoker  . Smokeless tobacco: Never Used  Substance and Sexual Activity  . Alcohol use: No    Alcohol/week: 0.0 oz  . Drug use: No  . Sexual activity: Yes    Partners: Male    Birth control/protection: Surgical    Comment: Tubal ligation  Lifestyle  . Physical activity:    Days per week: Not on file    Minutes per session: Not on file  . Stress: Not on file  Relationships  . Social connections:    Talks on phone: Not on file    Gets together: Not on file    Attends religious service: Not on file    Active member of club or organization: Not on file    Attends meetings of clubs or organizations: Not on file    Relationship status: Not on file  Other Topics Concern  . Not on file  Social History Narrative  . Not on file    Vitals:   07/20/17 0808  BP: 120/76  Pulse: 74  Resp: 12  Temp: 97.9 F (36.6 C)  SpO2: 98%   Body mass index is 24.98 kg/m.   Physical Exam  Nursing note and vitals reviewed. Constitutional: She is oriented to person, place, and time. She appears well-developed and well-nourished. She does not appear ill. No distress.  HENT:  Head: Normocephalic and atraumatic.  Mouth/Throat: Oropharynx is clear and moist and mucous membranes are normal.  Eyes: Pupils are equal, round, and reactive to light. Conjunctivae are normal. No scleral icterus.  Cardiovascular: Normal rate and regular rhythm.  No murmur heard. Respiratory: Effort normal and breath sounds normal. No respiratory distress.  GI: Soft. Bowel sounds are normal.  She exhibits mass (?). She exhibits no distension. There is hepatomegaly. There is no tenderness.    Musculoskeletal: She exhibits no edema or tenderness.  Lymphadenopathy:    She has no cervical adenopathy.  Neurological: She is alert and oriented to person, place, and time. She has normal strength. Gait normal.  Skin: Skin is warm. No rash noted. No erythema.  Psychiatric: Her mood appears anxious.  Well groomed, good eye contact.      ASSESSMENT AND PLAN:  Ms. Nancy Chandler was seen today for lack of energy, stomach issues, ua overdue and anxiety.  Diagnoses and all orders for this visit:  Lab Results  Component Value Date   ALT 14 07/20/2017   AST 15  07/20/2017   ALKPHOS 84 07/20/2017   BILITOT 0.4 07/20/2017   Lab Results  Component Value Date   CREATININE 0.88 07/20/2017   BUN 13 07/20/2017   NA 144 07/20/2017   K 4.1 07/20/2017   CL 105 07/20/2017   CO2 31 07/20/2017   Lab Results  Component Value Date   WBC 5.5 07/20/2017   HGB 13.8 07/20/2017   HCT 41.9 07/20/2017   MCV 80.8 07/20/2017   PLT 173.0 07/20/2017   Lab Results  Component Value Date   MICROALBUR <0.7 07/20/2017    Insomnia, unspecified type  Pharmacologic treatment options discussed,she agrees with trying Trazodone. Side effects discussed,she will start with 1/2 tab and increase to whole tab in a week if well tolerated. Good sleep hygiene. F/U in 4 weeks.  -     traZODone (DESYREL) 50 MG tablet; Take 0.5-1 tablets (25-50 mg total) by mouth at bedtime.  Fatigue, unspecified type  We discussed possible etiologies: Systemic illness, immunologic,endocrinology,sleep disorder, psychiatric/psychologic, infectious,medications side effects, and idiopathic. Examination today does not suggest a serious process.  Improving sleep may help.  Further recommendations will be given according to lab results.  -     Comprehensive metabolic panel -     CBC  Anxiety disorder, unspecified type  Trazodone  50 mg daily. Psychotherapy to be consider. F/U in 4 weeks.  -     traZODone (DESYREL) 50 MG tablet; Take 0.5-1 tablets (25-50 mg total) by mouth at bedtime.  Right upper quadrant abdominal mass  ? Hepatomegaly. Recommendations will be given according to imaging results.  -     US Abdomen Complete; Future  Gastroesophageal reflux disease, esophagitis presence not specified  GERD precautions. Omeprazole daily recommended. F/U in 4 weeks.  -     omeprazole (PRILOSEC) 20 MG capsule; Take 1 capsule (20 mg total) by mouth daily.  Controlled type 2 diabetes mellitus with diabetic autonomic neuropathy, without long-term current use of insulin (HCC) -     Microalbumin / creatinine urine ratio     Return in about 1 month (around 08/17/2017) for insomnia,fatigue,GERD.       Mita Vallo G. Martinique, MD  [facility]. [place] office.

## 2017-07-20 NOTE — Patient Instructions (Addendum)
A few things to remember from today's visit:   Insomnia, unspecified type - Plan: traZODone (DESYREL) 50 MG tablet  Anxiety disorder, unspecified type - Plan: traZODone (DESYREL) 50 MG tablet  Fatigue, unspecified type - Plan: Comprehensive metabolic panel, CBC  Right upper quadrant abdominal mass - Plan: US Abdomen Complete  Controlled type 2 diabetes mellitus with diabetic autonomic neuropathy, without long-term current use of insulin (HCC) - Plan: Microalbumin / creatinine urine ratio  Gastroesophageal reflux disease, esophagitis presence not specified  Trazodone at bedtime.  Omeprazole 20 mg daily 30 before breakfast.   Please be sure medication list is accurate. If a new problem present, please set up appointment sooner than planned today.

## 2017-07-21 ENCOUNTER — Encounter: Payer: Self-pay | Admitting: Family Medicine

## 2017-08-06 ENCOUNTER — Ambulatory Visit
Admission: RE | Admit: 2017-08-06 | Discharge: 2017-08-06 | Disposition: A | Discharge status: Home / Self Care | Payer: BLUE CROSS/BLUE SHIELD | Source: Ambulatory Visit | Attending: Family Medicine | Admitting: Family Medicine

## 2017-08-06 DIAGNOSIS — R1901 Right upper quadrant abdominal swelling, mass and lump: Secondary | ICD-10-CM

## 2017-08-07 ENCOUNTER — Encounter: Payer: Self-pay | Admitting: Family Medicine

## 2017-08-19 DIAGNOSIS — F419 Anxiety disorder, unspecified: Secondary | ICD-10-CM | POA: Insufficient documentation

## 2017-08-19 DIAGNOSIS — G4700 Insomnia, unspecified: Secondary | ICD-10-CM | POA: Insufficient documentation

## 2017-08-19 NOTE — Progress Notes (Signed)
HPI:   Ms.Tashauna Kash [place] is a 62 y.o. female, who is here today to follow on recent OV.   She was seen on 07/20/17. Last visit upon palpation RUQ not soft area, thought to be hepatomegaly. RUQ [place] was negative.    Insomnia: Started on Trazodone 50 mg at bedtime.  She was also reporting worsening anxiety.  Sleeping much better, 8-9 hours, she feels rested next day.  She feels "more calm" , dealing with stress better. She did fly recently, she never did before and was very nervous, did fine.  Fatigue improved.  Sleeping better, from 9:30 pm to 6:30 Am.She gets up once to use bathroom and able to go back to sleep.  GERD:  Last visit she was c/o mid chest discomfort when eating certain foods, "gas",mild nausea. Symptoms were worse at night.   Omeprazole 20 mg was started. Today she is reporting resolution of GI symptoms. Initially when she started PPI she has some constipation that lasted a couple of days but and resolved now.  Denies abdominal pain, nausea, vomiting, blood in stool or melena.   DM II , she is on non pharmacologic treatment. Occasional tingling on toes. She was on Metformin 1-2 years ago.  Last HgA1C in 02/2017 was 6.2.  She [place] not checking BS's  Denies abdominal pain, nausea,vomiting, polydipsia,polyuria, or polyphagia.     Review of Systems  Constitutional: Negative for activity change, appetite change, chills, fatigue and fever.  HENT: Negative for mouth sores, nosebleeds, sore throat and trouble swallowing.   Eyes: Negative for visual disturbance.  Respiratory: Negative for cough, shortness of breath and wheezing.   Cardiovascular: Negative for palpitations and leg swelling.  Gastrointestinal: Positive for constipation (improved). Negative for abdominal pain, nausea and vomiting.  Endocrine: Negative for polydipsia, polyphagia and polyuria.  Genitourinary: Negative for decreased urine volume and hematuria.  Musculoskeletal:  Negative for gait problem and myalgias.  Skin: Negative for rash and wound.  Neurological: Negative for syncope, weakness and headaches.  Psychiatric/Behavioral: Negative for confusion. The patient is not nervous/anxious.       Current Outpatient Medications on File Prior to Visit  Medication Sig Dispense Refill  . atorvastatin (LIPITOR) 20 MG tablet Take 1 tablet (20 mg total) by mouth daily. 90 tablet 3  . clobetasol ointment (TEMOVATE) 2.68 % Apply 1 application topically 2 (two) times daily. 60 g 4  . fluticasone (FLONASE) 50 MCG/ACT nasal spray Place into the nose as needed.    . hydrochlorothiazide (MICROZIDE) 12.5 MG capsule Take 1 capsule (12.5 mg total) by mouth daily. 90 capsule 1  . loratadine (CLARITIN) 10 MG tablet Take 10 mg by mouth daily as needed for allergies.    . ranitidine (ZANTAC) 150 MG tablet TAKE 1 TABLET (150 MG TOTAL) BY MOUTH AS NEEDED FOR HEARTBURN 180 tablet 1   Current Facility-Administered Medications on File Prior to Visit  Medication Dose Route Frequency Provider Last Rate Last Dose  . technetium sestamibi generic (CARDIOLITE) injection 10.1 milli Curie  34.1 millicurie Intravenous Once PRN Carlena Bjornstad, MD         Past Medical History:  Diagnosis Date  . Abnormal Pap smear of cervix    neg HPV HR+ 16/18 neg 2017, 2018 LGSIL HPV-, 2019 ASCUS HPV HR+  . Anxiety   . Depression   . Diabetes mellitus without complication ([place])   . Dyspareunia   . History of colon polyps   . Hyperlipidemia   . Hypertension   .  Lichen sclerosus   . Thyroid disease    Allergies  Allergen Reactions  . Hydrocodone Nausea And Vomiting    Social History   Socioeconomic History  . Marital status: Married    Spouse name: Not on file  . Number of children: 2  . Years of education: Not on file  . Highest education level: Not on file  Occupational History  . Not on file  Social Needs  . Financial resource strain: Not on file  . Food insecurity:    Worry: Not  on file    Inability: Not on file  . Transportation needs:    Medical: Not on file    Non-medical: Not on file  Tobacco Use  . Smoking status: Never Smoker  . Smokeless tobacco: Never Used  Substance and Sexual Activity  . Alcohol use: No    Alcohol/week: 0.0 oz  . Drug use: No  . Sexual activity: Yes    Partners: Male    Birth control/protection: Surgical    Comment: Tubal ligation  Lifestyle  . Physical activity:    Days per week: Not on file    Minutes per session: Not on file  . Stress: Not on file  Relationships  . Social connections:    Talks on phone: Not on file    Gets together: Not on file    Attends religious service: Not on file    Active member of club or organization: Not on file    Attends meetings of clubs or organizations: Not on file    Relationship status: Not on file  Other Topics Concern  . Not on file  Social History Narrative  . Not on file    Vitals:   08/20/17 0703  BP: 124/82  Pulse: 67  Resp: 12  Temp: 98 F (36.7 C)  SpO2: 99%   Body mass index is 25.35 kg/m.   Physical Exam  Nursing note and vitals reviewed. Constitutional: She is oriented to person, place, and time. She appears well-developed and well-nourished. She does not appear ill. No distress.  HENT:  Head: Normocephalic and atraumatic.  Mouth/Throat: Oropharynx is clear and moist and mucous membranes are normal.  Eyes: Conjunctivae are normal. No scleral icterus.  Cardiovascular: Normal rate and regular rhythm.  No murmur heard. Pulses:      Dorsalis pedis pulses are 2+ on the right side, and 2+ on the left side.  Respiratory: Effort normal and breath sounds normal. No respiratory distress.  GI: Soft. She exhibits no mass. There is no hepatomegaly. There is no tenderness.  Musculoskeletal: She exhibits no edema.  Lymphadenopathy:    She has no cervical adenopathy.  Neurological: She is alert and oriented to person, place, and time. She has normal strength. Gait  normal.  Skin: Skin is warm. No rash noted. No erythema.  Psychiatric: She has a normal mood and affect.  Well groomed, good eye contact.    ASSESSMENT AND PLAN:  Ms. Lakeshia was seen today for follow-up.   Orders Placed This Encounter  Procedures  . POCT glycosylated hemoglobin (Hb A1C)   Lab Results  Component Value Date   HGBA1C 5.7 (A) 08/20/2017    GERD (gastroesophageal reflux disease) Symptoms resolved. We discussed some side effects of PPIs. She can try to take omeprazole 20 mg every other day and eventually as needed. Continue GERD precautions. Follow-up in 6 months.  Insomnia, unspecified Now it is well controlled with trazodone 50 mg at bedtime. We discussed some side effects  of medications, she could take medication daily and skip weekends. Good sleep hygiene. Follow-up in 6 months.  Anxiety disorder, unspecified Improved with trazodone. No changes in current management. Follow-up in 6 months, before if needed.  Diabetes mellitus type 2, controlled ([place]) HgA1C at goal. Continue nonpharmacologic treatment. Eye exam current. Continue healthy diet and periodic foot care.  F/U in 12 months.       Aston Lawhorn G. Martinique, MD  [facility]. [place] office.

## 2017-08-20 ENCOUNTER — Ambulatory Visit (INDEPENDENT_AMBULATORY_CARE_PROVIDER_SITE_OTHER): Payer: BLUE CROSS/BLUE SHIELD | Admitting: Family Medicine

## 2017-08-20 ENCOUNTER — Encounter: Payer: Self-pay | Admitting: Family Medicine

## 2017-08-20 VITALS — BP 124/82 | HR 67 | Temp 98.0°F | Resp 12 | Ht 63.25 in | Wt 144.2 lb

## 2017-08-20 DIAGNOSIS — G4700 Insomnia, unspecified: Secondary | ICD-10-CM

## 2017-08-20 DIAGNOSIS — E1143 Type 2 diabetes mellitus with diabetic autonomic (poly)neuropathy: Secondary | ICD-10-CM | POA: Diagnosis not present

## 2017-08-20 DIAGNOSIS — K219 Gastro-esophageal reflux disease without esophagitis: Secondary | ICD-10-CM | POA: Diagnosis not present

## 2017-08-20 DIAGNOSIS — F419 Anxiety disorder, unspecified: Secondary | ICD-10-CM

## 2017-08-20 LAB — POCT GLYCOSYLATED HEMOGLOBIN (HGB A1C): Hemoglobin A1C: 5.7 % — AB (ref 4.0–5.6)

## 2017-08-20 MED ORDER — TRAZODONE HCL 50 MG PO TABS: 25.0000 mg | ORAL_TABLET | Freq: Every day | ORAL | 1 refills | Status: DC

## 2017-08-20 MED ORDER — OMEPRAZOLE 20 MG PO CPDR: 20.0000 mg | DELAYED_RELEASE_CAPSULE | Freq: Every day | ORAL | 1 refills | Status: DC

## 2017-08-20 NOTE — Assessment & Plan Note (Signed)
Improved with trazodone. No changes in current management. Follow-up in 6 months, before if needed.

## 2017-08-20 NOTE — Assessment & Plan Note (Signed)
Now it is well controlled with trazodone 50 mg at bedtime. We discussed some side effects of medications, she could take medication daily and skip weekends. Good sleep hygiene. Follow-up in 6 months.

## 2017-08-20 NOTE — Patient Instructions (Addendum)
A few things to remember from today's visit:   Gastroesophageal reflux disease without esophagitis  Insomnia, unspecified type - Plan: traZODone (DESYREL) 50 MG tablet  Anxiety disorder, unspecified type - Plan: traZODone (DESYREL) 50 MG tablet  Controlled type 2 diabetes mellitus with diabetic autonomic neuropathy, without long-term current use of insulin ([place]) - Plan: POCT glycosylated hemoglobin (Hb A1C)  Gastroesophageal reflux disease, esophagitis presence not specified - Plan: omeprazole (PRILOSEC) 20 MG capsule  No changes in trazodone.  You can take medication daily and skip weekends. Good sleep hygiene is the most important part of the treatment. Continue avoiding food that can irritate your stomach. You can try to take omeprazole every other day and eventually as needed. I will see you back in 6 months.  Please be sure medication list is accurate. If a new problem present, please set up appointment sooner than planned today.

## 2017-08-20 NOTE — Assessment & Plan Note (Signed)
Symptoms resolved. We discussed some side effects of PPIs. She can try to take omeprazole 20 mg every other day and eventually as needed. Continue GERD precautions. Follow-up in 6 months.

## 2017-08-20 NOTE — Assessment & Plan Note (Addendum)
HgA1C at goal. Continue nonpharmacologic treatment. Eye exam current. Continue healthy diet and periodic foot care.  F/U in 12 months.

## 2017-09-19 ENCOUNTER — Other Ambulatory Visit: Payer: Self-pay | Admitting: Family Medicine

## 2017-09-19 DIAGNOSIS — I10 Essential (primary) hypertension: Secondary | ICD-10-CM

## 2017-11-02 ENCOUNTER — Other Ambulatory Visit: Payer: Self-pay | Admitting: *Deleted

## 2017-11-02 MED ORDER — EPINEPHRINE 0.3 MG/0.3ML IJ SOAJ: 0.3000 mg | Freq: Once | INTRAMUSCULAR | 1 refills | Status: DC

## 2017-12-20 LAB — HM MAMMOGRAPHY

## 2017-12-24 ENCOUNTER — Encounter: Payer: Self-pay | Admitting: Family Medicine

## 2018-02-22 ENCOUNTER — Ambulatory Visit: Payer: BLUE CROSS/BLUE SHIELD | Admitting: Family Medicine

## 2018-02-25 ENCOUNTER — Ambulatory Visit (INDEPENDENT_AMBULATORY_CARE_PROVIDER_SITE_OTHER): Payer: BLUE CROSS/BLUE SHIELD | Admitting: Family Medicine

## 2018-02-25 ENCOUNTER — Encounter: Payer: Self-pay | Admitting: Family Medicine

## 2018-02-25 VITALS — BP 126/80 | HR 65 | Temp 97.8°F | Resp 12 | Ht 63.25 in | Wt 139.4 lb

## 2018-02-25 DIAGNOSIS — I10 Essential (primary) hypertension: Secondary | ICD-10-CM

## 2018-02-25 DIAGNOSIS — E1143 Type 2 diabetes mellitus with diabetic autonomic (poly)neuropathy: Secondary | ICD-10-CM

## 2018-02-25 DIAGNOSIS — G4700 Insomnia, unspecified: Secondary | ICD-10-CM | POA: Diagnosis not present

## 2018-02-25 DIAGNOSIS — E785 Hyperlipidemia, unspecified: Secondary | ICD-10-CM

## 2018-02-25 DIAGNOSIS — Z23 Encounter for immunization: Secondary | ICD-10-CM | POA: Diagnosis not present

## 2018-02-25 LAB — COMPREHENSIVE METABOLIC PANEL
ALT: 12 U/L (ref 0–35)
AST: 14 U/L (ref 0–37)
Albumin: 3.7 g/dL (ref 3.5–5.2)
Alkaline Phosphatase: 74 U/L (ref 39–117)
BUN: 10 mg/dL (ref 6–23)
CO2: 30 mEq/L (ref 19–32)
Calcium: 9.2 mg/dL (ref 8.4–10.5)
Chloride: 105 mEq/L (ref 96–112)
Creatinine, Ser: 0.91 mg/dL (ref 0.40–1.20)
GFR: 80.17 mL/min (ref >60.00)
Glucose, Bld: 106 mg/dL — ABNORMAL HIGH (ref 70–99)
Potassium: 4.1 mEq/L (ref 3.5–5.1)
Sodium: 142 mEq/L (ref 135–145)
Total Bilirubin: 0.3 mg/dL (ref 0.2–1.2)
Total Protein: 6.8 g/dL (ref 6.0–8.3)

## 2018-02-25 LAB — LIPID PANEL
Cholesterol: 163 mg/dL (ref 0–200)
HDL: 54.8 mg/dL (ref >39.00)
LDL Cholesterol: 97 mg/dL (ref 0–99)
NonHDL: 108.62
Total CHOL/HDL Ratio: 3
Triglycerides: 59 mg/dL (ref 0.0–149.0)
VLDL: 11.8 mg/dL (ref 0.0–40.0)

## 2018-02-25 LAB — MICROALBUMIN / CREATININE URINE RATIO
Creatinine,U: 200.2 mg/dL
Microalb Creat Ratio: 0.5 mg/g (ref 0.0–30.0)
Microalb, Ur: 0.9 mg/dL (ref 0.0–1.9)

## 2018-02-25 LAB — HEMOGLOBIN A1C: Hgb A1c MFr Bld: 6.2 % (ref 4.6–6.5)

## 2018-02-25 MED ORDER — TRAZODONE HCL 50 MG PO TABS: 25.0000 mg | ORAL_TABLET | Freq: Every day | ORAL | 2 refills | Status: DC

## 2018-02-25 MED ORDER — HYDROCHLOROTHIAZIDE 12.5 MG PO CAPS: ORAL_CAPSULE | ORAL | 3 refills | Status: DC

## 2018-02-25 NOTE — Progress Notes (Addendum)
HPI:   Nancy Chandler is a 63 y.o. female, who is here today for 6 months follow up.   Nancy Chandler was last seen on 02/26/17.  Recently evaluated due to respiratory symptoms. Dx with sinusitis,started on abx. Still having some sore throat but feeling much better in general.   Insomnia: Currently Nancy Chandler is on trazodone 50 mg daily. Medication is still helping. Nancy Chandler takes medication daily. Sleeps about 8 hours, if Nancy Chandler wakes up Nancy Chandler is able to go back to sleep fast. Nancy Chandler feels rested.  Diabetes Mellitus II:  Currently on non pharmacologic treatment. Checking BS's : Not checking   Nancy Chandler denies abdominal pain, nausea, vomiting, polydipsia, polyuria, or polyphagia. Negative for numbness or tingling. Burning sensation left great toe stable.   Lab Results  Component Value Date   CREATININE 0.88 07/20/2017   BUN 13 07/20/2017   NA 144 07/20/2017   K 4.1 07/20/2017   CL 105 07/20/2017   CO2 31 07/20/2017    Lab Results  Component Value Date   HGBA1C 5.7 (A) 08/20/2017   Lab Results  Component Value Date   MICROALBUR <0.7 07/20/2017  HTN: Nancy Chandler is not checking BP at home. Nancy Chandler is on HCTZ 12.5 mg daily.  Denies severe/frequent headache, visual changes, chest pain, dyspnea, palpitation, claudication, focal weakness, or edema.  HLD: Nancy Chandler is on Lipitor 20 mg daily. Tolerating medication well.  Lab Results  Component Value Date   CHOL 222 (H) 03/14/2017   HDL 64.80 03/14/2017   LDLCALC 145 (H) 03/14/2017   TRIG 62.0 03/14/2017   CHOLHDL 3 03/14/2017     Review of Systems  Constitutional: Negative for activity change, appetite change, fatigue and fever.  HENT: Positive for congestion, postnasal drip and rhinorrhea. Negative for mouth sores, nosebleeds and trouble swallowing.   Eyes: Negative for redness and visual disturbance.  Respiratory: Negative for cough, shortness of breath and wheezing.   Cardiovascular: Negative for chest pain, palpitations and leg swelling.   Gastrointestinal: Negative for abdominal pain, nausea and vomiting.       Negative for changes in bowel habits.  Endocrine: Negative for polydipsia, polyphagia and polyuria.  Genitourinary: Negative for decreased urine volume and hematuria.  Skin: Negative for rash and wound.  Neurological: Negative for syncope, weakness, numbness and headaches.  Psychiatric/Behavioral: Positive for sleep disturbance. Negative for confusion. The patient is nervous/anxious.      Current Outpatient Medications on File Prior to Visit  Medication Sig Dispense Refill  . amoxicillin-clavulanate (AUGMENTIN) 500-125 MG tablet Take by mouth.    Marland Kitchen atorvastatin (LIPITOR) 20 MG tablet Take 1 tablet (20 mg total) by mouth daily. 90 tablet 3  . clobetasol ointment (TEMOVATE) 3.73 % Apply 1 application topically 2 (two) times daily. 60 g 4  . dextromethorphan (DELSYM) 30 MG/5ML liquid Take by mouth.    . fluticasone (FLONASE) 50 MCG/ACT nasal spray Place into the nose as needed.    Marland Kitchen ibuprofen (ADVIL,MOTRIN) 200 MG tablet Take by mouth.    . loratadine (CLARITIN) 10 MG tablet Take 10 mg by mouth daily as needed for allergies.    Marland Kitchen EPINEPHrine (EPIPEN 2-PAK) 0.3 mg/0.3 mL IJ SOAJ injection Inject 0.3 mLs (0.3 mg total) into the muscle once for 1 dose. 1 Device 1   Current Facility-Administered Medications on File Prior to Visit  Medication Dose Route Frequency Provider Last Rate Last Dose  . technetium sestamibi generic (CARDIOLITE) injection 10.1 milli Curie  42.8 millicurie Intravenous Once PRN Carlena Bjornstad,  MD         Past Medical History:  Diagnosis Date  . Abnormal Pap smear of cervix    neg HPV HR+ 16/18 neg 2017, 2018 LGSIL HPV-, 2019 ASCUS HPV HR+  . Anxiety   . Depression   . Diabetes mellitus without complication ([place])   . Dyspareunia   . History of colon polyps   . Hyperlipidemia   . Hypertension   . Lichen sclerosus   . Thyroid disease    Allergies  Allergen Reactions  . Hydrocodone Nausea  And Vomiting    Social History   Socioeconomic History  . Marital status: Married    Spouse name: Not on file  . Number of children: 2  . Years of education: Not on file  . Highest education level: Not on file  Occupational History  . Not on file  Social Needs  . Financial resource strain: Not on file  . Food insecurity:    Worry: Not on file    Inability: Not on file  . Transportation needs:    Medical: Not on file    Non-medical: Not on file  Tobacco Use  . Smoking status: Never Smoker  . Smokeless tobacco: Never Used  Substance and Sexual Activity  . Alcohol use: No    Alcohol/week: 0.0 standard drinks  . Drug use: No  . Sexual activity: Yes    Partners: Male    Birth control/protection: Surgical    Comment: Tubal ligation  Lifestyle  . Physical activity:    Days per week: Not on file    Minutes per session: Not on file  . Stress: Not on file  Relationships  . Social connections:    Talks on phone: Not on file    Gets together: Not on file    Attends religious service: Not on file    Active member of club or organization: Not on file    Attends meetings of clubs or organizations: Not on file    Relationship status: Not on file  Other Topics Concern  . Not on file  Social History Narrative  . Not on file    Vitals:   02/25/18 0742  BP: 126/80  Pulse: 65  Resp: 12  Temp: 97.8 F (36.6 C)  SpO2: 99%   Body mass index is 24.49 kg/m.   Physical Exam  Nursing note and vitals reviewed. Constitutional: Nancy Chandler is oriented to person, place, and time. Nancy Chandler appears well-developed and well-nourished. No distress.  HENT:  Head: Normocephalic and atraumatic.  Mouth/Throat: Oropharynx is clear and moist and mucous membranes are normal.  Eyes: Pupils are equal, round, and reactive to light. Conjunctivae are normal.  Cardiovascular: Normal rate and regular rhythm.  No murmur heard. Pulses:      Dorsalis pedis pulses are 2+ on the right side and 2+ on the left  side.  Respiratory: Effort normal and breath sounds normal. No respiratory distress.  GI: Soft. Nancy Chandler exhibits no mass. There is no hepatomegaly. There is no abdominal tenderness.  Musculoskeletal:        General: No edema.  Lymphadenopathy:    Nancy Chandler has no cervical adenopathy.  Neurological: Nancy Chandler is alert and oriented to person, place, and time. Nancy Chandler has normal strength. No cranial nerve deficit. Gait normal.  Skin: Skin is warm. No rash noted. No erythema.  Psychiatric: Nancy Chandler has a normal mood and affect.  Well groomed, good eye contact.   Diabetic Foot Exam - Simple   Simple Foot Form Diabetic  Foot exam was performed with the following findings:  Yes 02/25/2018  8:04 AM  Visual Inspection No deformities, no ulcerations, no other skin breakdown bilaterally:  Yes Sensation Testing Intact to touch and monofilament testing bilaterally:  Yes Pulse Check Posterior Tibialis and Dorsalis pulse intact bilaterally:  Yes Comments      ASSESSMENT AND PLAN:   Nancy Chandler was seen today for 6 months follow-up.  Orders Placed This Encounter  Procedures  . Flu Vaccine QUAD 36+ mos IM  . Comprehensive metabolic panel  . Hemoglobin A1c  . Lipid panel  . Microalbumin / creatinine urine ratio   Lab Results  Component Value Date   HGBA1C 6.2 02/25/2018   Lab Results  Component Value Date   ALT 12 02/25/2018   AST 14 02/25/2018   ALKPHOS 74 02/25/2018   BILITOT 0.3 02/25/2018   Lab Results  Component Value Date   CREATININE 0.91 02/25/2018   BUN 10 02/25/2018   NA 142 02/25/2018   K 4.1 02/25/2018   CL 105 02/25/2018   CO2 30 02/25/2018    Lab Results  Component Value Date   CHOL 163 02/25/2018   HDL 54.80 02/25/2018   LDLCALC 97 02/25/2018   TRIG 59.0 02/25/2018   CHOLHDL 3 02/25/2018   Lab Results  Component Value Date   MICROALBUR 0.9 02/25/2018     Insomnia, unspecified type Problem is well controlled. Good sleep hygiene. Some side effects of Trazodone  discussed.  -     traZODone (DESYREL) 50 MG tablet; Take 0.5-1 tablets (25-50 mg total) by mouth at bedtime.  Controlled type 2 diabetes mellitus with diabetic autonomic neuropathy, without long-term current use of insulin ([place]) HgA1C has been at goal. Continue non pharmacologic treatment. Regular exercise and healthy diet with avoidance of added sugar food intake is an important part of treatment and recommended. Annual eye exam, periodic dental and foot care recommended. F/U in 5-6 months  -     Comprehensive metabolic panel -     Hemoglobin A1c -     Microalbumin / creatinine urine ratio  Essential hypertension Adequately controlled. No changes in current management. Continue low salt diet. Recommend monitoring BP at home. Eye exam recommended annually. F/U in 6 months, before if needed.  -     Comprehensive metabolic panel -     hydrochlorothiazide (MICROZIDE) 12.5 MG capsule; TAKE 1 CAPSULE BY MOUTH EVERY DAY  Hyperlipidemia, unspecified hyperlipidemia type No changes in current management, will follow labs done today and will give further recommendations accordingly.  -     Comprehensive metabolic panel -     Lipid panel  Need for immunization against influenza -     Flu Vaccine QUAD 36+ mos IM    Return in about 6 months (around 08/26/2018) for CPE.     Mairi Stagliano G. Martinique, MD  [facility]. [place] office.

## 2018-02-25 NOTE — Patient Instructions (Addendum)
A few things to remember from today's visit:   Insomnia, unspecified type  Controlled type 2 diabetes mellitus with diabetic autonomic neuropathy, without long-term current use of insulin ([place]) - Plan: Comprehensive metabolic panel, Hemoglobin A1c, Microalbumin / creatinine urine ratio  Essential hypertension - Plan: Comprehensive metabolic panel  Hyperlipidemia, unspecified hyperlipidemia type - Plan: Comprehensive metabolic panel, Lipid panel   Please be sure medication list is accurate. If a new problem present, please set up appointment sooner than planned today.

## 2018-02-26 ENCOUNTER — Encounter: Payer: Self-pay | Admitting: Family Medicine

## 2018-02-26 MED ORDER — ATORVASTATIN CALCIUM 20 MG PO TABS: 20.0000 mg | ORAL_TABLET | Freq: Every day | ORAL | 3 refills | Status: DC

## 2018-06-18 ENCOUNTER — Ambulatory Visit: Payer: BLUE CROSS/BLUE SHIELD | Admitting: Certified Nurse Midwife

## 2018-06-21 ENCOUNTER — Encounter: Payer: Self-pay | Admitting: Family Medicine

## 2018-06-21 ENCOUNTER — Ambulatory Visit (INDEPENDENT_AMBULATORY_CARE_PROVIDER_SITE_OTHER): Payer: BLUE CROSS/BLUE SHIELD | Admitting: Family Medicine

## 2018-06-21 ENCOUNTER — Other Ambulatory Visit: Payer: Self-pay

## 2018-06-21 VITALS — BP 124/82 | HR 65 | Temp 98.0°F | Resp 12 | Ht 63.25 in | Wt 137.0 lb

## 2018-06-21 DIAGNOSIS — K5900 Constipation, unspecified: Secondary | ICD-10-CM | POA: Diagnosis not present

## 2018-06-21 DIAGNOSIS — R319 Hematuria, unspecified: Secondary | ICD-10-CM

## 2018-06-21 DIAGNOSIS — R1032 Left lower quadrant pain: Secondary | ICD-10-CM | POA: Diagnosis not present

## 2018-06-21 DIAGNOSIS — R350 Frequency of micturition: Secondary | ICD-10-CM | POA: Diagnosis not present

## 2018-06-21 LAB — CBC WITH DIFFERENTIAL/PLATELET
Basophils Absolute: 0 10*3/uL (ref 0.0–0.1)
Basophils Relative: 0.5 % (ref 0.0–3.0)
Eosinophils Absolute: 0 10*3/uL (ref 0.0–0.7)
Eosinophils Relative: 0.9 % (ref 0.0–5.0)
HCT: 42.2 % (ref 36.0–46.0)
Hemoglobin: 13.7 g/dL (ref 12.0–15.0)
Lymphocytes Relative: 31.7 % (ref 12.0–46.0)
Lymphs Abs: 1.6 10*3/uL (ref 0.7–4.0)
MCHC: 32.5 g/dL (ref 30.0–36.0)
MCV: 81 fl (ref 78.0–100.0)
Monocytes Absolute: 0.4 10*3/uL (ref 0.1–1.0)
Monocytes Relative: 8.4 % (ref 3.0–12.0)
Neutro Abs: 3 10*3/uL (ref 1.4–7.7)
Neutrophils Relative %: 58.5 % (ref 43.0–77.0)
Platelets: 169 10*3/uL (ref 150.0–400.0)
RBC: 5.2 Mil/uL — ABNORMAL HIGH (ref 3.87–5.11)
RDW: 14.3 % (ref 11.5–15.5)
WBC: 5.1 10*3/uL (ref 4.0–10.5)

## 2018-06-21 LAB — BASIC METABOLIC PANEL
BUN: 11 mg/dL (ref 6–23)
CO2: 32 mEq/L (ref 19–32)
Calcium: 9 mg/dL (ref 8.4–10.5)
Chloride: 103 mEq/L (ref 96–112)
Creatinine, Ser: 0.8 mg/dL (ref 0.40–1.20)
GFR: 87.43 mL/min (ref >60.00)
Glucose, Bld: 95 mg/dL (ref 70–99)
Potassium: 3.5 mEq/L (ref 3.5–5.1)
Sodium: 143 mEq/L (ref 135–145)

## 2018-06-21 LAB — URINALYSIS, MICROSCOPIC ONLY

## 2018-06-21 LAB — POCT URINALYSIS DIPSTICK
Bilirubin, UA: NEGATIVE
Glucose, UA: NEGATIVE
Ketones, UA: NEGATIVE
Leukocytes, UA: NEGATIVE
Nitrite, UA: NEGATIVE
Odor: NEGATIVE
Protein, UA: NEGATIVE
Spec Grav, UA: 1.02 (ref 1.010–1.025)
Urobilinogen, UA: 0.2 E.U./dL
pH, UA: 6 (ref 5.0–8.0)

## 2018-06-21 NOTE — Progress Notes (Signed)
HPI:  Chief Complaint  Patient presents with  . Urinary Frequency  . Lower abdominal pain    Nancy Chandler is a 63 y.o. female, who is here today complaining of 3 weeks of urinary symptoms (urinary frequency). Symptoms are is stable. She has not tried OTC medication for urine symptoms. Dysuria: Negative Urinary frequency:Yes Urinary urgency: Negative No changes in urine continence. Gross hematuria: Negative  She denies fever, chills, change in appetite, or unusual fatigue.  Abdominal pain: Constant LLQ, occasionally radiated to left groin area. Yesterday pain was 8/10, today 4/10. She took ibuprofen last night. She has not noted masses or bulge areas on affected side. She has not identified exacerbating or alleviating factors. Denies prior history of abdominal pain.  Nausea or vomiting: Denies Abnormal vaginal bleeding or discharge: Negative  LMP: Postmenopausal Sexual activity: Yes, sex intercourse exacerbates LLQ pain. Hx of UTI: Negative   Review of Systems  Constitutional: Negative for activity change, appetite change, fatigue and fever.  HENT: Negative for sore throat.   Gastrointestinal: Positive for abdominal pain and constipation. Negative for nausea and vomiting.  Musculoskeletal: Negative for back pain and myalgias.  Rest ROS see pertinent positives and negatives on HPI.   Current Outpatient Medications on File Prior to Visit  Medication Sig Dispense Refill  . atorvastatin (LIPITOR) 20 MG tablet Take 1 tablet (20 mg total) by mouth daily. 90 tablet 3  . clobetasol ointment (TEMOVATE) 5.63 % Apply 1 application topically 2 (two) times daily. 60 g 4  . dextromethorphan (DELSYM) 30 MG/5ML liquid Take by mouth.    . fluticasone (FLONASE) 50 MCG/ACT nasal spray Place into the nose as needed.    . hydrochlorothiazide (MICROZIDE) 12.5 MG capsule TAKE 1 CAPSULE BY MOUTH EVERY DAY 90 capsule 3  . ibuprofen (ADVIL,MOTRIN) 200 MG tablet Take by  mouth.    . loratadine (CLARITIN) 10 MG tablet Take 10 mg by mouth daily as needed for allergies.    . traZODone (DESYREL) 50 MG tablet Take 0.5-1 tablets (25-50 mg total) by mouth at bedtime. 90 tablet 2  . EPINEPHrine (EPIPEN 2-PAK) 0.3 mg/0.3 mL IJ SOAJ injection Inject 0.3 mLs (0.3 mg total) into the muscle once for 1 dose. 1 Device 1   Current Facility-Administered Medications on File Prior to Visit  Medication Dose Route Frequency Provider Last Rate Last Dose  . technetium sestamibi generic (CARDIOLITE) injection 10.1 milli Curie  14.9 millicurie Intravenous Once PRN Carlena Bjornstad, MD         Past Medical History:  Diagnosis Date  . Abnormal Pap smear of cervix    neg HPV HR+ 16/18 neg 2017, 2018 LGSIL HPV-, 2019 ASCUS HPV HR+  . Anxiety   . Depression   . Diabetes mellitus without complication ([place])   . Dyspareunia   . History of colon polyps   . Hyperlipidemia   . Hypertension   . Lichen sclerosus   . Thyroid disease    Allergies  Allergen Reactions  . Hydrocodone Nausea And Vomiting    Social History   Socioeconomic History  . Marital status: Married    Spouse name: Not on file  . Number of children: 2  . Years of education: Not on file  . Highest education level: Not on file  Occupational History  . Not on file  Social Needs  . Financial resource strain: Not on file  . Food insecurity:    Worry: Not on file    Inability: Not  on file  . Transportation needs:    Medical: Not on file    Non-medical: Not on file  Tobacco Use  . Smoking status: Never Smoker  . Smokeless tobacco: Never Used  Substance and Sexual Activity  . Alcohol use: No    Alcohol/week: 0.0 standard drinks  . Drug use: No  . Sexual activity: Yes    Partners: Male    Birth control/protection: Surgical    Comment: Tubal ligation  Lifestyle  . Physical activity:    Days per week: Not on file    Minutes per session: Not on file  . Stress: Not on file  Relationships  . Social  connections:    Talks on phone: Not on file    Gets together: Not on file    Attends religious service: Not on file    Active member of club or organization: Not on file    Attends meetings of clubs or organizations: Not on file    Relationship status: Not on file  Other Topics Concern  . Not on file  Social History Narrative  . Not on file    Vitals:   06/21/18 0849  BP: 124/82  Pulse: 65  Resp: 12  Temp: 98 F (36.7 C)  SpO2: 98%   Body mass index is 24.08 kg/m.   Physical Exam  Nursing note and vitals reviewed. Constitutional: She is oriented to person, place, and time. She appears well-developed. No distress.  HENT:  Head: Normocephalic and atraumatic.  Eyes: Conjunctivae are normal.  Cardiovascular: Normal rate and regular rhythm.  Respiratory: Effort normal and breath sounds normal. No respiratory distress.  GI: Soft. She exhibits no mass. There is abdominal tenderness in the left lower quadrant. There is no rigidity, no rebound, no guarding and no CVA tenderness. No hernia.    Musculoskeletal:        General: No edema.  Lymphadenopathy:    She has no cervical adenopathy.  Neurological: She is alert and oriented to person, place, and time.  Skin: Skin is warm. No erythema.  Psychiatric: Her speech is normal. Her mood appears anxious.    ASSESSMENT AND PLAN:  Nancy Chandler was seen today for urinary frequency and lower abdominal pain.  Orders Placed This Encounter  Procedures  . Urine culture  . Urine Microscopic Only  . Basic metabolic panel  . CBC with Differential  . POC Urinalysis Dipstick   Lab Results  Component Value Date   WBC 5.1 06/21/2018   HGB 13.7 06/21/2018   HCT 42.2 06/21/2018   MCV 81.0 06/21/2018   PLT 169.0 06/21/2018   Lab Results  Component Value Date   CREATININE 0.80 06/21/2018   BUN 11 06/21/2018   NA 143 06/21/2018   K 3.5 06/21/2018   CL 103 06/21/2018   CO2 32 06/21/2018    Urinary frequency Possible etiology  discussed. No other associated urinary symptoms + urine dipstick today otherwise negative, except for 1+. I do not think antibiotic is needed at this time. We will follow urine culture and make recommendations accordingly.  Acute left lower quadrant pain We discussed possible etiologies, could be related with constipation. Examination today do not suggest a serious process, so I do not think abdominal imaging is needed at this time. We will treat constipation. Clearly instructed about warning signs. Follow-up as needed.  She has not had a colonoscopy done due to cost issues.  Hematuria, unspecified type Urine microscopic ordered today.Further recommendation will be given accordingly.  Constipation,  unspecified constipation type MiraLAX daily until she has a good bowel movement were recommended. Adequate hydration and fiber intake, Benefiber twice daily recommended. Further recommendation will be given according to lab results.    -Nancy Chandler was advised to return or notify a doctor immediately if symptoms worsen or persist or new concerns arise.     Robinn Overholt G. Martinique, MD  [facility] [place] office.

## 2018-06-21 NOTE — Patient Instructions (Signed)
A few things to remember from today's visit:   Urinary frequency - Plan: POC Urinalysis Dipstick, Urine Microscopic Only  Acute left lower quadrant pain  Hematuria, unspecified type - Plan: Urine Microscopic Only  Constipation, unspecified constipation type  MiraLAX daily until you have a good bowel movement. Benefiber in the morning and night. Adequate hydration.   Please be sure medication list is accurate. If a new problem present, please set up appointment sooner than planned today.

## 2018-06-22 LAB — URINE CULTURE
MICRO NUMBER:: 438784
SPECIMEN QUALITY:: ADEQUATE

## 2018-06-26 ENCOUNTER — Encounter: Payer: Self-pay | Admitting: Family Medicine

## 2018-08-21 ENCOUNTER — Ambulatory Visit (INDEPENDENT_AMBULATORY_CARE_PROVIDER_SITE_OTHER): Payer: BC Managed Care – PPO | Admitting: Certified Nurse Midwife

## 2018-08-21 ENCOUNTER — Encounter: Payer: Self-pay | Admitting: Certified Nurse Midwife

## 2018-08-21 ENCOUNTER — Other Ambulatory Visit (HOSPITAL_COMMUNITY)
Admission: RE | Admit: 2018-08-21 | Discharge: 2018-08-21 | Disposition: A | Discharge status: Home / Self Care | Payer: BC Managed Care – PPO | Source: Ambulatory Visit | Attending: Certified Nurse Midwife | Admitting: Certified Nurse Midwife

## 2018-08-21 ENCOUNTER — Other Ambulatory Visit: Payer: Self-pay

## 2018-08-21 VITALS — BP 108/68 | HR 64 | Temp 97.4°F | Resp 16 | Ht 63.25 in | Wt 137.0 lb

## 2018-08-21 DIAGNOSIS — Z0000 Encounter for general adult medical examination without abnormal findings: Secondary | ICD-10-CM

## 2018-08-21 DIAGNOSIS — N904 Leukoplakia of vulva: Secondary | ICD-10-CM

## 2018-08-21 DIAGNOSIS — N951 Menopausal and female climacteric states: Secondary | ICD-10-CM

## 2018-08-21 DIAGNOSIS — Z124 Encounter for screening for malignant neoplasm of cervix: Secondary | ICD-10-CM | POA: Diagnosis not present

## 2018-08-21 DIAGNOSIS — Z01419 Encounter for gynecological examination (general) (routine) without abnormal findings: Secondary | ICD-10-CM | POA: Diagnosis not present

## 2018-08-21 NOTE — Progress Notes (Signed)
63 y.o. G2P2002 Married  African American Fe here for annual exam. Post menopausal with occasional night sweats. Denies vaginal bleeding. Complaining of irritation in vulva area. History of Lichen Sclerosis and possible flare. Sees PCP Betty Martinique for hypertension and cholesterol management, labs. No STD concerns. Has started walking with exercise. No other health issues.  Patient's last menstrual period was 02/21/2008.          Sexually active: Yes.    The current method of family planning is tubal ligation.    Exercising: Yes.    walking Smoker:  no  Review of Systems  Constitutional: Negative.   HENT: Negative.   Eyes: Negative.   Respiratory: Negative.   Cardiovascular: Negative.   Gastrointestinal: Negative.   Genitourinary:       Feels tender in vaginal area  Musculoskeletal: Negative.   Skin: Negative.   Neurological: Negative.   Endo/Heme/Allergies: Negative.   Psychiatric/Behavioral: Negative.     Health Maintenance: Pap:  05-24-16 LGSIL HPV HR-, 05-25-17 ASCUS HPV HR + History of Abnormal Pap: yes MMG:  12-20-17 category b density birads 2:neg Self Breast exams: yes Colonoscopy:  2018 f/u 53yrs BMD:   2012 TDaP:  2015 Shingles: no Pneumonia: 2018 Hep C and HIV: both neg 2016 Labs: if needed.   reports that she has never smoked. She has never used smokeless tobacco. She reports that she does not drink alcohol or use drugs.  Past Medical History:  Diagnosis Date  . Abnormal Pap smear of cervix    neg HPV HR+ 16/18 neg 2017, 2018 LGSIL HPV-, 2019 ASCUS HPV HR+  . Anxiety   . Depression   . Diabetes mellitus without complication ([place])   . Dyspareunia   . History of colon polyps   . Hyperlipidemia   . Hypertension   . Lichen sclerosus   . Thyroid disease     Past Surgical History:  Procedure Laterality Date  . COLPOSCOPY  2010, 2018, 2019   History of LSIL, ASCUS + HPV  . LYMPH NODE BIOPSY    . TUBAL LIGATION  1990    Current Outpatient Medications   Medication Sig Dispense Refill  . atorvastatin (LIPITOR) 20 MG tablet Take 1 tablet (20 mg total) by mouth daily. 90 tablet 3  . clobetasol ointment (TEMOVATE) 5.57 % Apply 1 application topically 2 (two) times daily. 60 g 4  . dextromethorphan (DELSYM) 30 MG/5ML liquid Take by mouth.    . EPINEPHrine (EPIPEN 2-PAK) 0.3 mg/0.3 mL IJ SOAJ injection Inject 0.3 mLs (0.3 mg total) into the muscle once for 1 dose. 1 Device 1  . fluticasone (FLONASE) 50 MCG/ACT nasal spray Place into the nose as needed.    . hydrochlorothiazide (MICROZIDE) 12.5 MG capsule TAKE 1 CAPSULE BY MOUTH EVERY DAY 90 capsule 3  . ibuprofen (ADVIL,MOTRIN) 200 MG tablet Take by mouth.    . loratadine (CLARITIN) 10 MG tablet Take 10 mg by mouth daily as needed for allergies.    . traZODone (DESYREL) 50 MG tablet Take 0.5-1 tablets (25-50 mg total) by mouth at bedtime. 90 tablet 2   No current facility-administered medications for this visit.    Facility-Administered Medications Ordered in Other Visits  Medication Dose Route Frequency Provider Last Rate Last Dose  . technetium sestamibi generic (CARDIOLITE) injection 10.1 milli Curie  32.2 millicurie Intravenous Once PRN Carlena Bjornstad, MD        Family History  Problem Relation Age of Onset  . Hypertension Brother   . Hearing  loss Brother   . Hyperlipidemia Brother   . Cancer Mother        colon  . Hypertension Father   . Alcohol abuse Father   . Diabetes Father   . Heart disease Maternal Grandmother        CHF  . Arthritis Maternal Grandmother   . Cancer Maternal Grandfather        kidney  . Heart disease Paternal Grandmother        CHF  . Birth defects Daughter   . Breast cancer Paternal Aunt   . Heart disease Paternal Aunt        open heart surgery    ROS:  Pertinent items are noted in HPI.  Otherwise, a comprehensive ROS was negative.  Exam:   BP 108/68   Pulse 64   Temp (!) 97.4 F (36.3 C) (Skin)   Resp 16   Ht 5' 3.25" (1.607 m)   Wt 137 lb  (62.1 kg)   LMP 02/21/2008   BMI 24.08 kg/m  Height: 5' 3.25" (160.7 cm) Ht Readings from Last 3 Encounters:  08/21/18 5' 3.25" (1.607 m)  06/21/18 5' 3.25" (1.607 m)  02/25/18 5' 3.25" (1.607 m)    General appearance: alert, cooperative and appears stated age Head: Normocephalic, without obvious abnormality, atraumatic Neck: no adenopathy, supple, symmetrical, trachea midline and thyroid normal to inspection and palpation Lungs: clear to auscultation bilaterally Breasts: normal appearance, no masses or tenderness, No nipple retraction or dimpling, No nipple discharge or bleeding, No axillary or supraclavicular adenopathy Heart: regular rate and rhythm Abdomen: soft, non-tender; no masses,  no organomegaly Extremities: extremities normal, atraumatic, no cyanosis or edema Skin: Skin color, texture, turgor normal. No rashes or lesions Lymph nodes: Cervical, supraclavicular, and axillary nodes normal. No abnormal inguinal nodes palpated Neurologic: Grossly normal   Pelvic: External genitalia:  no lesions              Urethra:  normal appearing urethra with no masses, tenderness or lesions              Bartholin's and Skene's: normal                 Vagina: normal appearing vagina with normal color and discharge, no lesions              Cervix: multiparous appearance, no cervical motion tenderness and no lesions              Pap taken: Yes.   Bimanual Exam:  Uterus:  normal size, contour, position, consistency, mobility, non-tender and anteverted              Adnexa: normal adnexa and no mass, fullness, tenderness               Rectovaginal: Confirms               Anus:  normal sphincter tone, no lesions  Chaperone present: yes  A:  Well Woman with normal exam  Menopausal no HRT  Lichen Sclerosis slight flare, no change in pigmentation  Previous abnormal pap smear ASCUS + HPV follow up pap smear today  Hypertension/cholesterol management with PCP.  P:   Reviewed health and  wellness pertinent to exam  Aware of need to evaluate if vaginal bleeding  Discussed Lichen flare and reviewed treatment again with Clobetasol for 1 week and if symptoms subside, stop.  Repeat pap one year if normal if not per results  Continue follow up with PCP  as indicated.  Pap smear: yes   counseled on breast self exam, mammography screening, feminine hygiene, adequate intake of calcium and vitamin D, diet and exercise  return annually or prn  An After Visit Summary was printed and given to the patient.

## 2018-08-26 LAB — CYTOLOGY - PAP: HPV: NOT DETECTED

## 2018-08-28 ENCOUNTER — Telehealth: Payer: Self-pay

## 2018-08-28 ENCOUNTER — Telehealth: Payer: Self-pay | Admitting: Certified Nurse Midwife

## 2018-08-28 DIAGNOSIS — R87612 Low grade squamous intraepithelial lesion on cytologic smear of cervix (LGSIL): Secondary | ICD-10-CM

## 2018-08-28 NOTE — Telephone Encounter (Signed)
-----   Message from Regina Eck, CNM sent at 08/28/2018  8:11 AM EDT ----- Notify patient her pap smear showed LSIL again, but HPV not detected, due to previous abnormal she needs colposcopy exam again. Please schedule

## 2018-08-28 NOTE — Telephone Encounter (Signed)
Spoke with patient and notified of abnormal pap:neg HR HPV. Scheduled colposcopy for 09-13-18. Offered earlier appointment but patient was out of town recently and wants to wait her 14 days. Will precert with her Ins.  Sent to [place] for precert.

## 2018-08-28 NOTE — Telephone Encounter (Signed)
Spoke with patient and conveyed benefits for colposcopy. Patient understands/agreeable with the benefits. Patient is aware of the cancellation policy. Appointment scheduled 09/13/18.

## 2018-08-30 ENCOUNTER — Encounter: Payer: BLUE CROSS/BLUE SHIELD | Admitting: Family Medicine

## 2018-09-09 DIAGNOSIS — M810 Age-related osteoporosis without current pathological fracture: Secondary | ICD-10-CM | POA: Insufficient documentation

## 2018-09-11 ENCOUNTER — Other Ambulatory Visit: Payer: Self-pay

## 2018-09-11 ENCOUNTER — Encounter: Payer: Self-pay | Admitting: Certified Nurse Midwife

## 2018-09-13 ENCOUNTER — Other Ambulatory Visit: Payer: Self-pay

## 2018-09-13 ENCOUNTER — Encounter: Payer: Self-pay | Admitting: Certified Nurse Midwife

## 2018-09-13 ENCOUNTER — Ambulatory Visit (INDEPENDENT_AMBULATORY_CARE_PROVIDER_SITE_OTHER): Payer: BC Managed Care – PPO | Admitting: Certified Nurse Midwife

## 2018-09-13 VITALS — BP 110/68 | HR 64 | Resp 16 | Wt 139.0 lb

## 2018-09-13 DIAGNOSIS — R87612 Low grade squamous intraepithelial lesion on cytologic smear of cervix (LGSIL): Secondary | ICD-10-CM

## 2018-09-13 NOTE — Patient Instructions (Addendum)

## 2018-09-13 NOTE — Progress Notes (Addendum)
Patient ID: Nancy Chandler, female   DOB: [birth year], 63 y.o.   MRN: [id number]  Chief Complaint  Patient presents with  . Colposcopy    //jj    HPI Nancy Chandler is a 63 y.o. female here for colposcopy exam.  Denies vaginal bleeding or vaginal discharge or pelvic pain..    Indications: Pap smear on August 21 2018 showed: low-grade squamous intraepithelial neoplasia (LGSIL - encompassing HPV,mild dysplasia,CIN I).Negative HPV. Previous colposcopy: 2019 for ASCUS + HPV Showed benign squamous mucosa and benign endocervical cells, 2018 ECC showed LSIL  Past Medical History:  Diagnosis Date  . Abnormal Pap smear of cervix    neg HPV HR+ 16/18 neg 2017, 2018 LGSIL HPV-, 2019 ASCUS HPV HR+, 08-21-2018 LGSIL HPV HR-  . Anxiety   . Depression   . Diabetes mellitus without complication ([place])   . Dyspareunia   . History of colon polyps   . Hyperlipidemia   . Hypertension   . Lichen sclerosus   . Thyroid disease     Past Surgical History:  Procedure Laterality Date  . COLPOSCOPY  2010, 2018, 2019   History of LSIL, ASCUS + HPV  . LYMPH NODE BIOPSY    . TUBAL LIGATION  1990    Family History  Problem Relation Age of Onset  . Hypertension Brother   . Hearing loss Brother   . Hyperlipidemia Brother   . Cancer Mother        colon  . Hypertension Father   . Alcohol abuse Father   . Diabetes Father   . Heart disease Maternal Grandmother        CHF  . Arthritis Maternal Grandmother   . Cancer Maternal Grandfather        kidney  . Heart disease Paternal Grandmother        CHF  . Birth defects Daughter   . Breast cancer Paternal Aunt   . Heart disease Paternal Aunt        open heart surgery    Social History Social History   Tobacco Use  . Smoking status: Never Smoker  . Smokeless tobacco: Never Used  Substance Use Topics  . Alcohol use: No    Alcohol/week: 0.0 standard drinks  . Drug use: No    Allergies  Allergen Reactions  . Hydrocodone Nausea And  Vomiting    Current Outpatient Medications  Medication Sig Dispense Refill  . atorvastatin (LIPITOR) 20 MG tablet Take 20 mg by mouth daily.    . clobetasol ointment (TEMOVATE) 6.59 % Apply 1 application topically 2 (two) times daily. 60 g 4  . fluticasone (FLONASE) 50 MCG/ACT nasal spray Place into the nose as needed.    . hydrochlorothiazide (MICROZIDE) 12.5 MG capsule TAKE 1 CAPSULE BY MOUTH EVERY DAY 90 capsule 3  . ibuprofen (ADVIL,MOTRIN) 200 MG tablet Take by mouth.    . loratadine (CLARITIN) 10 MG tablet Take 10 mg by mouth daily as needed for allergies.     No current facility-administered medications for this visit.    Facility-Administered Medications Ordered in Other Visits  Medication Dose Route Frequency Provider Last Rate Last Dose  . technetium sestamibi generic (CARDIOLITE) injection 10.1 milli Curie  93.5 millicurie Intravenous Once PRN Carlena Bjornstad, MD        Review of Systems Review of Systems  Constitutional: Negative.   HENT: Negative.   Eyes: Negative.   Respiratory: Negative.   Cardiovascular: Negative.   Gastrointestinal: Negative.   Endocrine: Negative.  Genitourinary: Negative.   Musculoskeletal: Negative.   Skin: Negative.   Allergic/Immunologic: Negative.   Neurological: Negative.   Hematological: Negative.   Psychiatric/Behavioral: Negative.     Denies any health issues today.  Blood pressure 110/68, pulse 64, resp. rate 16, weight 139 lb (63 kg), last menstrual period 02/21/2008.  Physical Exam  Healthy African American female.  Data Reviewed Reviewed pap smear results with patient.  Assessment    Procedure Details  The risks and benefits of the procedure and Written informed consent obtained.  Speculum placed in vagina and excellent visualization of cervix achieved, cervix swabbed x 3 with acetic acid solution, cervix and vaginal walls viewed with 3.75,7.5, 15#, green filter. Lugol's applied with non staining noted at 3 and 9  o'clock and in cervical os. Biopsy taken at 3 o'clock, 9 o'clock and ECC taken. Monsel's applied. No active bleeding note on removal of speculum. Patient tolerated procedure well. Instructions both written and verbal given. Patient ambulated to check out in stable condition with no assistance. Physical Exam Genitourinary:        Specimens: 3  Complications: none.     Plan    Specimens labelled and sent to Pathology. Patient will be called with results when reviewed. Pathology reviewed and both biopsies showed LSIL and ECC showed LSIL. Her 5 year risk of CIN 3 is .92%. Notified patient of results personally and discussed due to continued occurrence for the past 3 years, treatment she be considered in the form of LEEP. Discussed procedure and hopeful outcome that the abnormal pap smears would stop, but she is aware of the HPV component as part of the issues. Questions addressed and patient request to schedule procedure. She will be called with information regarding and scheduled with Dr. Sabra Heck.   [name] 09/13/2018, 10:51 AM

## 2018-09-13 NOTE — Progress Notes (Signed)
08-21-2018 LGSIL HPV HR neg Previous abnormal hx. Pt took ibuprofen at 400mg  at 9:15am.

## 2018-09-16 ENCOUNTER — Telehealth: Payer: Self-pay

## 2018-09-16 ENCOUNTER — Encounter: Payer: Self-pay | Admitting: Family Medicine

## 2018-09-16 ENCOUNTER — Ambulatory Visit (INDEPENDENT_AMBULATORY_CARE_PROVIDER_SITE_OTHER): Payer: BC Managed Care – PPO | Admitting: Family Medicine

## 2018-09-16 VITALS — BP 138/66 | HR 87 | Temp 98.3°F | Resp 12 | Ht 63.25 in | Wt 141.0 lb

## 2018-09-16 DIAGNOSIS — E785 Hyperlipidemia, unspecified: Secondary | ICD-10-CM | POA: Diagnosis not present

## 2018-09-16 DIAGNOSIS — Z0000 Encounter for general adult medical examination without abnormal findings: Secondary | ICD-10-CM | POA: Diagnosis not present

## 2018-09-16 DIAGNOSIS — I10 Essential (primary) hypertension: Secondary | ICD-10-CM

## 2018-09-16 DIAGNOSIS — E1143 Type 2 diabetes mellitus with diabetic autonomic (poly)neuropathy: Secondary | ICD-10-CM | POA: Diagnosis not present

## 2018-09-16 LAB — POCT GLYCOSYLATED HEMOGLOBIN (HGB A1C): Hemoglobin A1C: 5.7 % — AB (ref 4.0–5.6)

## 2018-09-16 NOTE — Patient Instructions (Addendum)
A few things to remember from today's visit:   Controlled type 2 diabetes mellitus with diabetic autonomic neuropathy, without long-term current use of insulin ([place]) - Plan: POCT HgB A1C  Routine general medical examination at a health care facility Today you have you routine preventive visit.  At least 150 minutes of moderate exercise per week, daily brisk walking for 15-30 min is a good exercise option. Healthy diet low in saturated (animal) fats and sweets and consisting of fresh fruits and vegetables, lean meats such as fish and white chicken and whole grains.  These are some of recommendations for screening depending of age and risk factors:   - Vaccines:  Tdap vaccine every 10 years.  Shingles vaccine recommended at age 33, could be given after 64 years of age but not sure about insurance coverage.   Pneumonia vaccines:  Prevnar 13 at 65 and Pneumovax at 79. Sometimes Pneumovax is giving earlier if history of smoking, lung disease,diabetes,kidney disease among some.  Cervical cancer prevention:  Pap smear starts at 64 years of age and continues periodically until 64 years old in low risk women. Pap smear every 3 years between 46 and 55 years old. Pap smear every 3-5 years between women 97 and older if pap smear negative and HPV screening negative.   -Breast cancer: Mammogram: There is disagreement between experts about when to start screening in low risk asymptomatic female but recent recommendations are to start screening at 11 and not later than 64 years old , every 1-2 years and after 64 yo q 2 years. Screening is recommended until 64 years old but some women can continue screening depending of healthy issues.   Colon cancer screening: starts at 65 years old until 64 years old.  Also recommended:  1. Dental visit- Brush and floss your teeth twice daily; visit your dentist twice a year. 2. Eye doctor- Get an eye exam at least every 2 years. 3. Helmet use- Always wear a  helmet when riding a bicycle, motorcycle, rollerblading or skateboarding. 4. Safe sex- If you may be exposed to sexually transmitted infections, use a condom. 5. Seat belts- Seat belts can save your live; always wear one. 6. Smoke/Carbon Monoxide detectors- These detectors need to be installed on the appropriate level of your home. Replace batteries at least once a year. 7. Skin cancer- When out in the sun please cover up and use sunscreen 15 SPF or higher. 8. Violence- If anyone is threatening or hurting you, please tell your healthcare provider.  9. Drink alcohol in moderation- Limit alcohol intake to one drink or less per day. Never drink and drive.   Please be sure medication list is accurate. If a new problem present, please set up appointment sooner than planned today.

## 2018-09-16 NOTE — Telephone Encounter (Signed)
Patient called & notified of osteoporosis in right & left hip & spine. Patient states she has an appt with her pcp today & will discuss with them her options. Patient to call us back if she would like to meet with one of our physicians to discuss medication or to be referred to endocrinologist. Routing to [employer], cnm.

## 2018-09-16 NOTE — Progress Notes (Signed)
HPI:   Nancy Chandler is a 63 y.o. female, who is here today for her routine physical.  Last CPE: 2018 Gyn preventive visit on 08/21/18.  Regular exercise 3 or more time per week: Nancy Chandler is doing [employer] and walking daily with ehr husband. Following a healthy diet: Yes. She lives with her husband.  Chronic medical problems: DM II,HLD,GERD,anxiety, allergies,and thyroid nodule among some.  Pap smear done last on 05/26/18. Hx of abnormal pap smear, LGSIL, s/p colpo and Bx.  Immunization History  Administered Date(s) Administered   Influenza,inj,Quad PF,6+ Mos 03/14/2017, 02/25/2018   Influenza-Unspecified 12/04/2013, 11/21/2015   Pneumococcal Polysaccharide-23 03/16/2016   Tdap 12/08/2013    Mammogram: 11/2017 Colonoscopy: 09/13/18. DEXA: 07/2010. States that she had one done recently and was told to follow with PCP or endocrinologist to discuss treatment options for osteoporosis.  Hep C screening: 09/2014 NR  She has no concerns today.  DM II:She is on non pharmacologic treatment.  Denies abdominal pain, nausea,vomiting, polydipsia,polyuria, or polyphagia.  Lab Results  Component Value Date   HGBA1C 6.2 02/25/2018   HTN, she is on HCTZ 12.5 mg daily.  Lab Results  Component Value Date   CREATININE 0.80 06/21/2018   BUN 11 06/21/2018   NA 143 06/21/2018   K 3.5 06/21/2018   CL 103 06/21/2018   CO2 32 06/21/2018   HLD, she is on Atorvastatin 20 mg daily. She follows low fat diet. Lab Results  Component Value Date   CHOL 163 02/25/2018   HDL 54.80 02/25/2018   LDLCALC 97 02/25/2018   TRIG 59.0 02/25/2018   CHOLHDL 3 02/25/2018    Review of Systems  Constitutional: Negative for appetite change, fatigue and fever.  HENT: Negative for hearing loss, mouth sores, trouble swallowing and voice change.   Eyes: Negative for photophobia and visual disturbance.  Respiratory: Negative for cough, shortness of breath and wheezing.     Cardiovascular: Negative for chest pain and leg swelling.  Gastrointestinal: Negative for abdominal pain, nausea and vomiting.       No changes in bowel habits.  Endocrine: Negative for cold intolerance and heat intolerance.  Genitourinary: Negative for decreased urine volume, dysuria, hematuria, vaginal bleeding and vaginal discharge.  Musculoskeletal: Negative for arthralgias, back pain and neck pain.  Skin: Negative for color change and rash.  Neurological: Negative for syncope, weakness, numbness and headaches.  Hematological: Negative for adenopathy. Does not bruise/bleed easily.  Psychiatric/Behavioral: Negative for confusion and sleep disturbance. The patient is not nervous/anxious.   All other systems reviewed and are negative.   Current Outpatient Medications on File Prior to Visit  Medication Sig Dispense Refill   atorvastatin (LIPITOR) 20 MG tablet Take 20 mg by mouth daily.     clobetasol ointment (TEMOVATE) 3.29 % Apply 1 application topically 2 (two) times daily. 60 g 4   fluticasone (FLONASE) 50 MCG/ACT nasal spray Place into the nose as needed.     hydrochlorothiazide (MICROZIDE) 12.5 MG capsule TAKE 1 CAPSULE BY MOUTH EVERY DAY 90 capsule 3   ibuprofen (ADVIL,MOTRIN) 200 MG tablet Take by mouth.     loratadine (CLARITIN) 10 MG tablet Take 10 mg by mouth daily as needed for allergies.     Current Facility-Administered Medications on File Prior to Visit  Medication Dose Route Frequency Provider Last Rate Last Dose   technetium sestamibi generic (CARDIOLITE) injection 10.1 milli Curie  51.8 millicurie Intravenous Once PRN Carlena Bjornstad, MD  Past Medical History:  Diagnosis Date   Abnormal Pap smear of cervix    neg HPV HR+ 16/18 neg 2017, 2018 LGSIL HPV-, 2019 ASCUS HPV HR+, 08-21-2018 LGSIL HPV HR-   Anxiety    Depression    Diabetes mellitus without complication ([place])    Dyspareunia    History of colon polyps    Hyperlipidemia     Hypertension    Lichen sclerosus    Thyroid disease     Past Surgical History:  Procedure Laterality Date   COLPOSCOPY  2010, 2018, 2019   History of LSIL, ASCUS + HPV   LYMPH NODE BIOPSY     TUBAL LIGATION  1990    Allergies  Allergen Reactions   Hydrocodone Nausea And Vomiting    Family History  Problem Relation Age of Onset   Hypertension Brother    Hearing loss Brother    Hyperlipidemia Brother    Cancer Mother        colon   Hypertension Father    Alcohol abuse Father    Diabetes Father    Heart disease Maternal Grandmother        CHF   Arthritis Maternal Grandmother    Cancer Maternal Grandfather        kidney   Heart disease Paternal Grandmother        CHF   Birth defects Daughter    Breast cancer Paternal Aunt    Heart disease Paternal Aunt        open heart surgery    Social History   Socioeconomic History   Marital status: Married    Spouse name: Not on file   Number of children: 2   Years of education: Not on file   Highest education level: Not on file  Occupational History   Not on file  Social Needs   Financial resource strain: Not on file   Food insecurity    Worry: Not on file    Inability: Not on file   Transportation needs    Medical: Not on file    Non-medical: Not on file  Tobacco Use   Smoking status: Never Smoker   Smokeless tobacco: Never Used  Substance and Sexual Activity   Alcohol use: No    Alcohol/week: 0.0 standard drinks   Drug use: No   Sexual activity: Yes    Partners: Male    Birth control/protection: Surgical    Comment: Tubal ligation  Lifestyle   Physical activity    Days per week: Not on file    Minutes per session: Not on file   Stress: Not on file  Relationships   Social connections    Talks on phone: Not on file    Gets together: Not on file    Attends religious service: Not on file    Active member of club or organization: Not on file    Attends meetings of  clubs or organizations: Not on file    Relationship status: Not on file  Other Topics Concern   Not on file  Social History Narrative   Not on file   Vitals:   09/16/18 1443  BP: 138/66  Pulse: 87  Resp: 12  Temp: 98.3 F (36.8 C)  SpO2: 97%   Body mass index is 24.78 kg/m.   Wt Readings from Last 3 Encounters:  09/16/18 141 lb (64 kg)  09/13/18 139 lb (63 kg)  08/21/18 137 lb (62.1 kg)    Physical Exam  Nursing note and  vitals reviewed. Constitutional: She is oriented to person, place, and time. She appears well-developed and well-nourished. No distress.  HENT:  Head: Normocephalic and atraumatic.  Right Ear: Hearing, tympanic membrane, external ear and ear canal normal.  Left Ear: Hearing, tympanic membrane, external ear and ear canal normal.  Mouth/Throat: Uvula is midline, oropharynx is clear and moist and mucous membranes are normal.  Eyes: Pupils are equal, round, and reactive to light. Conjunctivae and EOM are normal.  Neck: No tracheal deviation present. Thyromegaly present.  Cardiovascular: Normal rate and regular rhythm.  No murmur heard. Pulses:      Dorsalis pedis pulses are 2+ on the right side and 2+ on the left side.  Respiratory: Effort normal and breath sounds normal. No respiratory distress.  GI: Soft. She exhibits no mass. There is no hepatomegaly. There is no abdominal tenderness.  Genitourinary:    Genitourinary Comments: Deferred to gyn.   Musculoskeletal:        General: No edema.     Comments: No major deformity or signs of synovitis appreciated.  Lymphadenopathy:    She has no cervical adenopathy.       Right: No supraclavicular adenopathy present.       Left: No supraclavicular adenopathy present.  Neurological: She is alert and oriented to person, place, and time. She has normal strength. No cranial nerve deficit. Coordination and gait normal.  Reflex Scores:      Bicep reflexes are 2+ on the right side and 2+ on the left side.       Patellar reflexes are 2+ on the right side and 2+ on the left side. Skin: Skin is warm. No rash noted. No erythema.  Psychiatric: She has a normal mood and affect. Cognition and memory are normal.  Well groomed, good eye contact.    ASSESSMENT AND PLAN:  Nancy Chandler was here today annual physical examination.   Orders Placed This Encounter  Procedures   POCT HgB A1C    Lab Results  Component Value Date   HGBA1C 5.7 (A) 09/16/2018    Routine general medical examination at a health care facility We discussed the importance of regular physical activity and healthy diet for prevention of chronic illness and/or complications. Preventive guidelines reviewed. Continue following with gyn for her female preventive care. She will sign a release form,so we can obtain DEXA result. Vaccination up to date. Ca++ and vit D supplementation recommended. Next CPE in a year.  Controlled type 2 diabetes mellitus with diabetic autonomic neuropathy, without long-term current use of insulin ([place]) HgA1C at goal. Continue non pharmacologic treatment. Regular exercise and healthy diet with avoidance of added sugar food intake is an important part of treatment and recommended. Annual eye exam, periodic dental and foot care recommended.  -     POCT HgB A1C  Essential hypertension BP Adequately controlled. Monitor BP at home. Continue low salt diet. Annual eye exam.  Hyperlipidemia, unspecified hyperlipidemia type Well controlled. Continue Atorvastatin 20 mg daily and low fat diet.  Problem are well controlled,she would like to continue following annually,which I think it is appropriate. Possible complications of poorly controlled DM and BP. She needs to monitor BS's and BP regularly and follow sooner if needed.   Return in 1 year (on 09/16/2019) for cpe and f/u.   Alicia Seib G. [place], MD  [facility]. [place] office.

## 2018-09-19 MED ORDER — ATORVASTATIN CALCIUM 20 MG PO TABS: 20.0000 mg | ORAL_TABLET | Freq: Every day | ORAL | 3 refills | Status: DC

## 2018-09-20 ENCOUNTER — Telehealth: Payer: Self-pay | Admitting: Certified Nurse Midwife

## 2018-09-20 NOTE — Telephone Encounter (Signed)
Unable to leave message to call back.

## 2018-09-21 ENCOUNTER — Telehealth: Payer: Self-pay | Admitting: Certified Nurse Midwife

## 2018-09-21 NOTE — Telephone Encounter (Signed)
Call to patient regarding colpo results. See Result note

## 2018-09-24 ENCOUNTER — Telehealth: Payer: Self-pay | Admitting: *Deleted

## 2018-09-24 ENCOUNTER — Telehealth: Payer: Self-pay

## 2018-09-24 DIAGNOSIS — N870 Mild cervical dysplasia: Secondary | ICD-10-CM

## 2018-09-24 NOTE — Telephone Encounter (Signed)
Call placed to convey benefits for Leep procedure. Spoke with patient she understands/agreeeable with the benefits. Appointment scheduled 10/08/18.

## 2018-09-24 NOTE — Telephone Encounter (Signed)
-----   Message from Regina Eck, CNM sent at 09/21/2018 10:45 AM EDT ----- Personally called patient and notified of colposcopy results showing both biopsies showed LSIL and Ecc showed LSIL. Discussed with patient due to age and 3 years of occurrence she should consider LEEP treatment to see if this would resolve and decrease procedure occurrence. She is aware of the HPV that has also been a part of this occurrence. Discussed LEEP procedure and would be done by Dr. Sabra Heck and questions answered regarding how this was done. Patient would like to proceed with LEEP to see if this will resolve.  She is aware she will be called to schedule and also given insurance information regarding procedure coverage.

## 2018-09-24 NOTE — Telephone Encounter (Signed)
Spoke with patient. Contraception is menopause and tubal ligation. LEEP procedure scheduled for 8/18 at 4pm with Dr. Sabra Heck. Advised to take Motrin 800 mg with food and water one hour before procedure. Order has been placed for precert. Patient verbalizes understanding and is agreeable.   Routing to provider for final review. Patient is agreeable to disposition. Will close encounter.  Cc: Dr. Sabra Heck, Lerry Liner, [place]

## 2018-09-24 NOTE — Telephone Encounter (Signed)
Patient returned call

## 2018-09-24 NOTE — Telephone Encounter (Signed)
Notes recorded by Burnice Logan, RN on 09/24/2018 at 8:43 AM EDT  Left message to call Sharee Pimple, RN at [facility].   Order placed for LEEP for precert.   See telephone encounter dated 09/24/18

## 2018-09-26 NOTE — Telephone Encounter (Signed)
Enter chart in error.

## 2018-10-08 ENCOUNTER — Other Ambulatory Visit: Payer: Self-pay

## 2018-10-08 ENCOUNTER — Ambulatory Visit (INDEPENDENT_AMBULATORY_CARE_PROVIDER_SITE_OTHER): Payer: BC Managed Care – PPO | Admitting: Obstetrics & Gynecology

## 2018-10-08 ENCOUNTER — Encounter: Payer: Self-pay | Admitting: Obstetrics & Gynecology

## 2018-10-08 VITALS — BP 150/80 | HR 76 | Temp 97.2°F | Ht 63.25 in | Wt 143.0 lb

## 2018-10-08 DIAGNOSIS — N870 Mild cervical dysplasia: Secondary | ICD-10-CM

## 2018-10-08 DIAGNOSIS — R87612 Low grade squamous intraepithelial lesion on cytologic smear of cervix (LGSIL): Secondary | ICD-10-CM

## 2018-10-08 NOTE — Progress Notes (Signed)
63 y.o. G2P2 Married AA female here for LEEP due to persistent low grade dysplasia noted over the last three years.  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  Pap obtained before colposcopy showed LGSIL.    Patient's last menstrual period was 02/21/2008.          Sexually active: Yes.    The current method of family planning is tubal ligation.     Pre-procedure vitals: Blood pressure (!) 150/80, pulse 76, temperature (!) 97.2 F (36.2 C), temperature source Temporal, height 5' 3.25" (1.607 m), weight 143 lb (64.9 kg), last menstrual period 02/21/2008.   Procedure explained and patient's questions were invited and answered.   Consent form signed.  Pre-procedure medication:  Motrin 800mg .  Time given:  1530  Procedure Set-up: Grounding pad located left thigh.  Cautery settings: 50 cut/50 coagulation.  Suction applied to coated speculum.  Procedure:  Speculum placed with good visualization of the cervix.  Colposcopy performed showing:  no visible lesions.  Cervix anesthetized using 2% Xylocaine with 1:100,000units Epinephrine.  8 cc's used.  Lot:  [id number].  Exp:  03/24/19.  Entire transition zone excised with 10 x 12 loop in 2 passes.  Specimen(s) placed on cork and labeled for pathology. ECC obtained above LEEP specimen. Hemostasis obtained with ball cautery and Monsel's solution.  EBL:  Minimal  Complications:  none  Patient tolerated procedure well and left the office in satisfactory condition.  Plan:  After visit summary given.  Repeat pap planned depending on pathology results.

## 2018-10-08 NOTE — Patient Instructions (Signed)
LEEP Post Procedure Instructions  1. You may take Ibuprofen, Aleve or Tylenol for pain if needed, cramping is normal   2. You will have black and/or bloody discharge at first. This will lighten and turn clear before completely resolving.. This will take 2 to 3 weeks.  3. Put nothing in your vagina for 4 full weeks.  4. You need to call if you have redness around the biopsy site, if there is any unusual draining, if the bleeding is heavy, or if you are concerned.  5. Shower and bathe as normal.  6. We will call you within a week with results or we will discuss the results at your follow-up appointment if needed.  7. You will need to return for a follow-up Pap smear as directed by your physician.

## 2018-10-14 ENCOUNTER — Encounter: Payer: Self-pay | Admitting: Family Medicine

## 2018-10-19 IMAGING — US US THYROID
1 series · 12 of 25 positions shown · non-contrast
Comparison: 02/10/2016

CLINICAL DATA: Multinodular goiter. Three thyroid nodules were
biopsied on 03/01/2016.

EXAM:
THYROID ULTRASOUND
TECHNIQUE: Ultrasound examination of the thyroid gland and adjacent soft
tissues was performed.

[Series 1: us thyroid · 0.04mm/px · 12 of 72 slices shown]
[im 3/72]
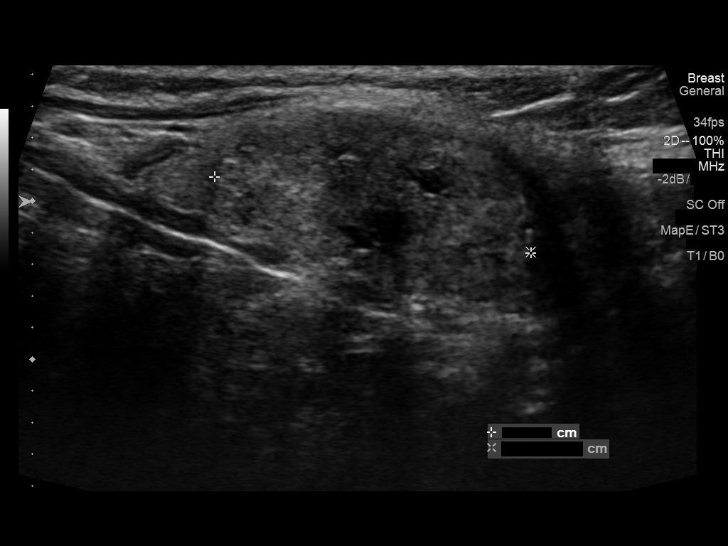
[im 9/72]
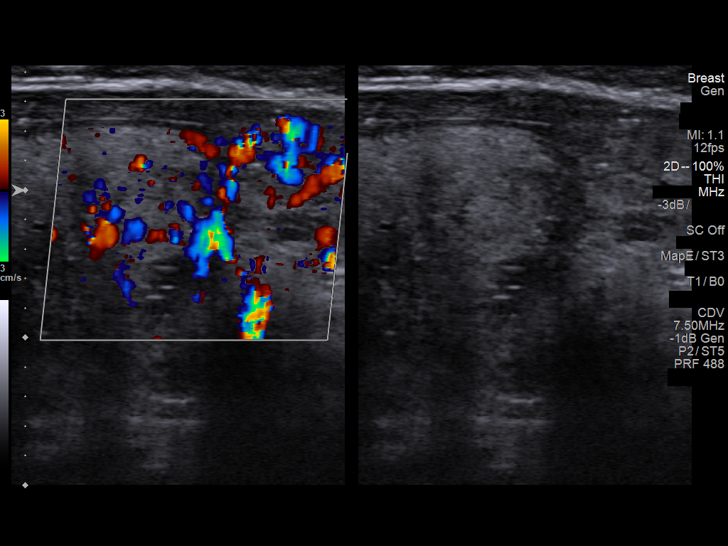
[im 15/72]
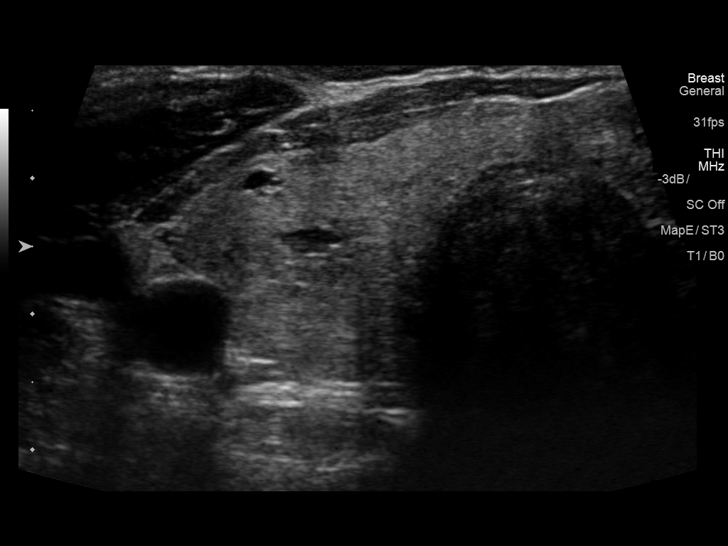
[im 21/72]
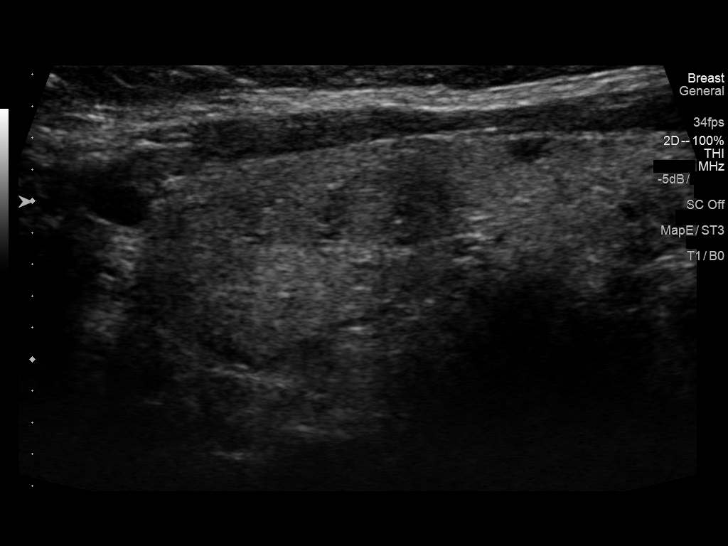
[im 27/72]
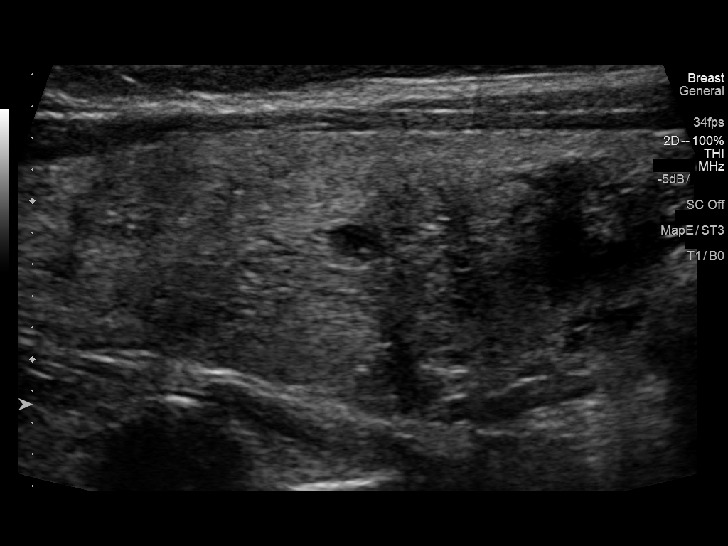
[im 33/72]
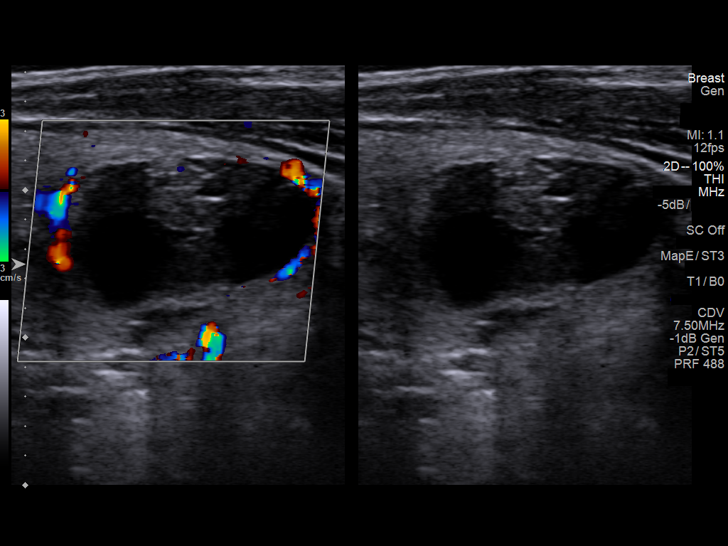
[im 39/72]
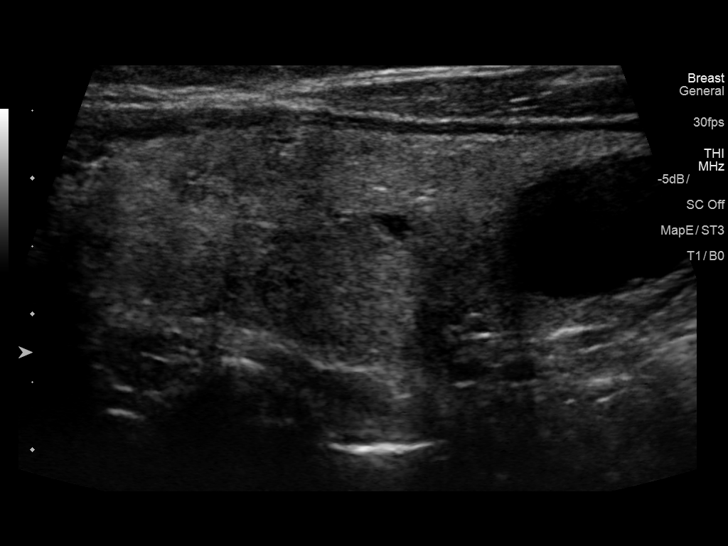
[im 45/72]
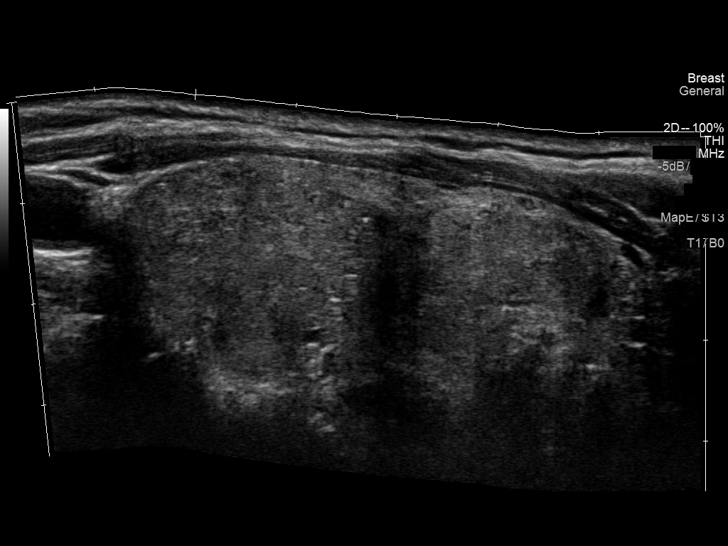
[im 51/72]
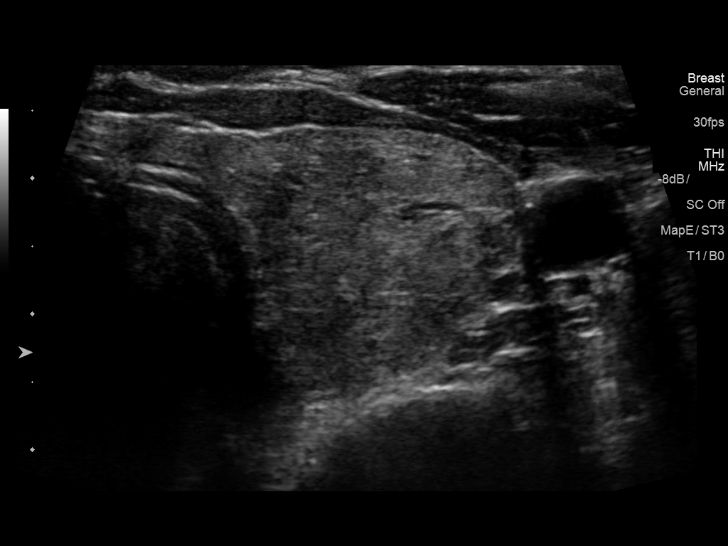
[im 57/72]
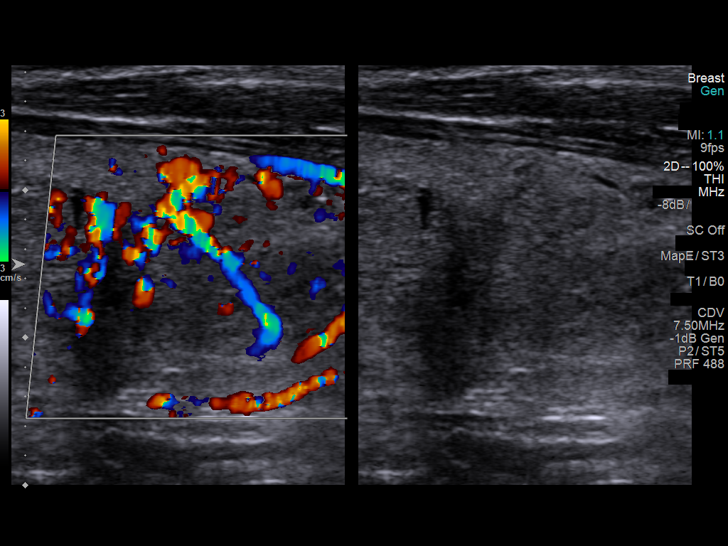
[im 63/72]
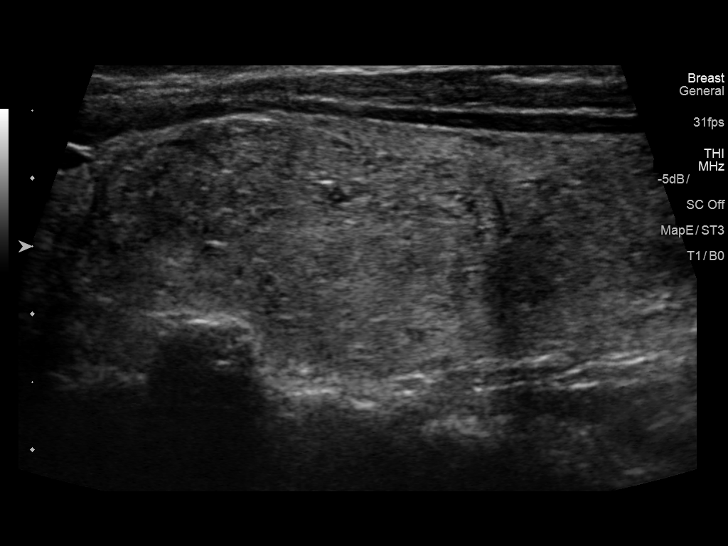
[im 69/72]
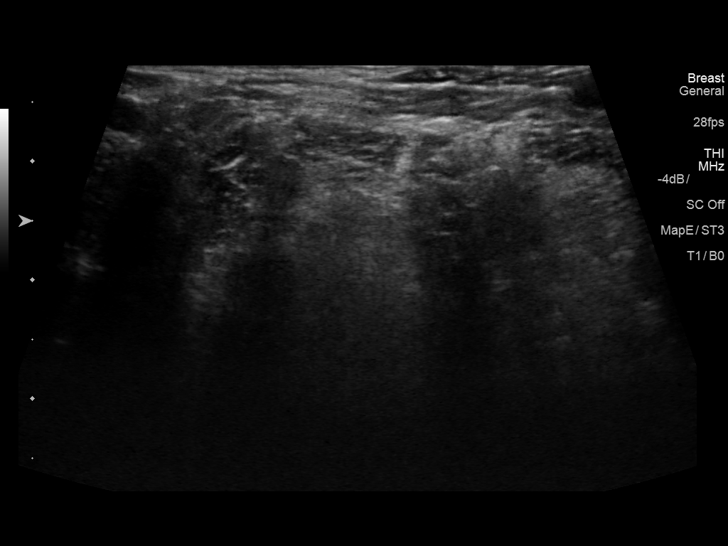

[12 of 25 positions shown; findings below may reference images not displayed]

FINDINGS: Parenchymal Echotexture: Moderately heterogenous

Isthmus: 1.2 cm, previously 1.1 cm

Right lobe: 5.1 x 1.9 x 2.0 cm, previously 5.0 x 2.1 x 1.6 cm

Left lobe: 5.6 x 2.4 x 2.1 cm, previously 5.9 x 2.1 x 2.2 cm

_________________________________________________________

Estimated total number of nodules >/= 1 cm: 6-10

Number of spongiform nodules >/=  2 cm not described below (TR1): 0

Number of mixed cystic and solid nodules >/= 1.5 cm not described
below (TR2): 0

_________________________________________________________

Dominant isthmus nodule was previously biopsied and measures 2.1 x
1.2 x 1.7 cm and previously measured 2.8 x 1.1 x 2.1 cm. This
dominant isthmus nodule is heterogeneous and relatively isoechoic.

Poorly defined isoechoic nodule along the right side of the isthmus
measures 0.9 x 0.5 x 0.7 cm. This isthmus nodule is a TR3 and does
not meet criteria for biopsy or dedicated follow-up.

Nodule # 1:

Prior biopsy: No

Location: Right; Superior

Maximum size: 1.5 cm; Other 2 dimensions: 0.6 x 1.5 cm, previously,
1.2 x 0.6 x 1.1 cm

Composition: solid/almost completely solid (2)

Echogenicity: isoechoic (1)

Shape: not taller-than-wide (0)

Margins: ill-defined (0)

Echogenic foci: none (0)

ACR TI-RADS total points: 3.

ACR TI-RADS risk category:  TR3 (3 points).

Significant change in size (>/= 20% in two dimensions and minimal
increase of 2 mm): Yes

Change in features: No

Change in ACR TI-RADS risk category: No

ACR TI-RADS recommendations:

*Given size (>/= 1.5 - 2.4 cm) and appearance, a follow-up
ultrasound in 1 year should be considered based on TI-RADS criteria.

_________________________________________________________

Biopsied mid right thyroid nodule measures 1.3 x 1.3 x 1.3 cm and
previously measured 1.5 x 1.4 x 1.3 cm. This nodule is poorly
defined and isoechoic.

Again noted is a mixed solid and cystic nodule in the inferior right
thyroid lobe measuring 2.1 x 1.1 x 1.5 cm and previously measured
1.6 x 0.7 x 1.4 cm. These cystic component of this nodule has
enlarged. This nodule does not meet criteria for biopsy.

Biopsied isoechoic nodule in the superior left thyroid lobe measures
3.0 x 1.8 x 2.1 cm and previously measured 3.1 x 1.9 x 2.0 cm.

Nodule # 2:

Prior biopsy: No

Location: Left; Inferior

Maximum size: 1.8 cm; Other 2 dimensions: 1.3 x 1.7 cm, previously,
2.2 x 1.2 x 2.1 cm

Composition: solid/almost completely solid (2)

Echogenicity: isoechoic (1)

Shape: not taller-than-wide (0)

Margins: ill-defined (0)

Echogenic foci: none (0)

ACR TI-RADS total points: 3.

ACR TI-RADS risk category:  TR3 (3 points).

Significant change in size (>/= 20% in two dimensions and minimal
increase of 2 mm): No

Change in features: No

Change in ACR TI-RADS risk category: No

ACR TI-RADS recommendations:

*Given size (>/= 1.5 - 2.4 cm) and appearance, a follow-up
ultrasound in 1 year should be considered based on TI-RADS criteria.

_________________________________________________________

No enlarged lymph nodes.
IMPRESSION: Multinodular goiter.

No significant change in the previously biopsied thyroid nodules.

Nodule in the superior right thyroid lobe has minimally enlarged and
meets criteria for 1 year follow-up.

Nodule in the inferior left thyroid lobe meets criteria for 1 year
follow-up.

The above is in keeping with the ACR TI-RADS recommendations - [HOSPITAL] 8250;[DATE].

## 2018-10-23 ENCOUNTER — Other Ambulatory Visit: Payer: Self-pay | Admitting: Certified Nurse Midwife

## 2018-10-23 DIAGNOSIS — L900 Lichen sclerosus et atrophicus: Secondary | ICD-10-CM

## 2018-10-23 NOTE — Telephone Encounter (Signed)
Medication refill request: Clobetasol ointment Last AEX:  08/21/18 DL Next AEX: 08/25/19 Last MMG (if hormonal medication request): 12/20/17 BIRADS 2 benign/density b Refill authorized: Please advise on refill

## 2018-11-07 ENCOUNTER — Other Ambulatory Visit: Payer: Self-pay

## 2018-11-08 ENCOUNTER — Ambulatory Visit (INDEPENDENT_AMBULATORY_CARE_PROVIDER_SITE_OTHER): Payer: BC Managed Care – PPO | Admitting: Obstetrics & Gynecology

## 2018-11-08 ENCOUNTER — Encounter: Payer: Self-pay | Admitting: Obstetrics & Gynecology

## 2018-11-08 VITALS — BP 120/76 | HR 72 | Temp 97.6°F | Ht 63.25 in | Wt 140.2 lb

## 2018-11-08 DIAGNOSIS — N870 Mild cervical dysplasia: Secondary | ICD-10-CM | POA: Diagnosis not present

## 2018-11-08 NOTE — Progress Notes (Signed)
GYNECOLOGY  VISIT  CC:   LEEP follow up  HPI: 63 y.o. G40P2002 Married Nancy Chandler here for 1 month LEEP follow up.  She had discharge for about 2 1/2 to 3 weeks.  H/O HPV about 10 years ago with normalization and then recurrent HR HPV with abnormal pap smears starting in 2017.  Due to persistent low grade dysplaisa and patient's anxiety about this, LEEP was performed on 09/24/2018 with CIN 1 and negative margins.  Pathology reviewed with pt.  She really wants a hysterectomy at this point which I do not feel is indicated due to CIN 1 and negative margins.  Pt does not want to wait one year for repeat HR HPV testing.  Would desire hysterectomy if HR HPV is positive again or pap is abnormal.  She and I discussed newest guidelines.  She is aware pap earlier  Is sooner than recommended but still desires this.  She would want a hysterectomy if this is abnormal.    GYNECOLOGIC HISTORY: Patient's last menstrual period was 02/21/2008. Contraception: PMP Menopausal hormone therapy: none  Patient Active Problem List   Diagnosis Date Noted  . Insomnia, unspecified 08/19/2017  . Anxiety disorder, unspecified 08/19/2017  . Multiple thyroid nodules 03/14/2017  . Syncope 03/15/2016  . Headache 03/15/2016  . Chest pain 10/13/2014  . GERD (gastroesophageal reflux disease) 10/13/2014  . Diabetes mellitus type 2, controlled ([place]) 09/29/2014  . Hyperlipidemia 12/08/2013  . Lichen sclerosus et atrophicus of the vulva 06/09/2013    Class: Diagnosis of  . Essential hypertension 05/12/2013    Class: History of    Past Medical History:  Diagnosis Date  . Abnormal Pap smear of cervix    neg HPV HR+ 16/18 neg 2017, 2018 LGSIL HPV-, 2019 ASCUS HPV HR+, 08-21-2018 LGSIL HPV HR-  . Anxiety   . Depression   . Diabetes mellitus without complication ([place])   . Dyspareunia   . History of colon polyps   . Hyperlipidemia   . Hypertension   . Lichen sclerosus   . Thyroid disease     Past  Surgical History:  Procedure Laterality Date  . COLPOSCOPY  2010, 2018, 2019   History of LSIL, ASCUS + HPV  . LYMPH NODE BIOPSY    . TUBAL LIGATION  1990    MEDS:   Current Outpatient Medications on File Prior to Visit  Medication Sig Dispense Refill  . atorvastatin (LIPITOR) 20 MG tablet Take 1 tablet (20 mg total) by mouth daily. 90 tablet 3  . Cholecalciferol (VITAMIN D) 50 MCG (2000 UT) CAPS Take by mouth daily.    . clobetasol ointment (TEMOVATE) 0.05 % Apply thinly to affected area one daily for no longer than one week if symptoms have stopped. 60 g 0  . fluticasone (FLONASE) 50 MCG/ACT nasal spray Place into the nose as needed.    . hydrochlorothiazide (MICROZIDE) 12.5 MG capsule TAKE 1 CAPSULE BY MOUTH EVERY DAY 90 capsule 3  . ibuprofen (ADVIL,MOTRIN) 200 MG tablet Take by mouth.    . loratadine (CLARITIN) 10 MG tablet Take 10 mg by mouth daily as needed for allergies.    . Multiple Vitamin (MULTIVITAMIN) tablet Take 1 tablet by mouth daily.     Current Facility-Administered Medications on File Prior to Visit  Medication Dose Route Frequency Provider Last Rate Last Dose  . technetium sestamibi generic (CARDIOLITE) injection 10.1 milli Curie  [id number] millicurie Intravenous Once PRN Carlena Bjornstad, MD  ALLERGIES: Hydrocodone  Family History  Problem Relation Age of Onset  . Hypertension Brother   . Hearing loss Brother   . Hyperlipidemia Brother   . Cancer Mother        colon  . Hypertension Father   . Alcohol abuse Father   . Diabetes Father   . Heart disease Maternal Grandmother        CHF  . Arthritis Maternal Grandmother   . Cancer Maternal Grandfather        kidney  . Heart disease Paternal Grandmother        CHF  . Birth defects Daughter   . Breast cancer Paternal Aunt   . Heart disease Paternal Aunt        open heart surgery    SH:  Married, non smoker  Review of Systems  All other systems reviewed and are negative.   PHYSICAL EXAMINATION:     BP 120/76   Pulse 72   Temp 97.6 F (36.4 C) (Temporal)   Ht 5' 3.25" (1.607 m)   Wt 140 lb 3.2 oz (63.6 kg)   LMP 02/21/2008   BMI 24.64 kg/m     General appearance: alert, cooperative and appears stated age Lymph:  no inguinal LAD noted  Pelvic: External genitalia:  no lesions              Urethra:  normal appearing urethra with no masses, tenderness or lesions              Bartholins and Skenes: normal                 Vagina: normal appearing vagina with normal color and discharge, no lesions              Cervix: no lesions and appropriately healing from LEEP              Bimanual Exam:  Uterus:  normal size, contour, position, consistency, mobility, non-tender              Adnexa: no mass, fullness, tenderness  Chaperone was present for exam.  Assessment: S/p LEEP due to persistent LGSIL pap and CIN 1, pathology with CIN 1 and negative margins Pt desires hysterectomy which I do not feel is indicated at this time  Plan: Repeat pap and HR HPV planned in six months.  If this is abnormal, pt will be desirous of hysterectomy   ~15 minutes spent with patient >50% of time was in face to face discussion of above.

## 2018-11-19 IMAGING — US US SOFT TISSUE HEAD/NECK
1 series · 12 of 25 positions shown · non-contrast
Comparison: None.

CLINICAL DATA: Palpable abnormality. Palpable abnormality in the
anterior superior left neck.

EXAM:
THYROID ULTRASOUND
TECHNIQUE: Ultrasound examination of the thyroid gland and adjacent soft
tissues was performed.

[Series 1: us soft tissue head/neck · 0.07mm/px · 12 of 74 slices shown]
[im 4/74]
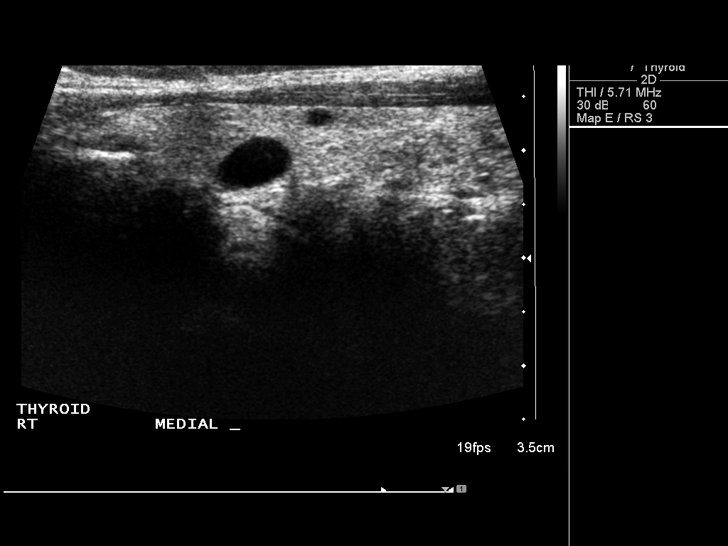
[im 10/74]
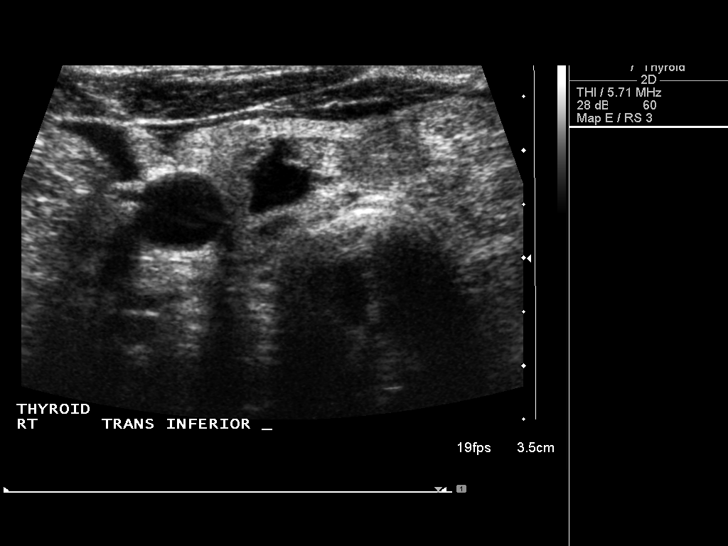
[im 16/74]
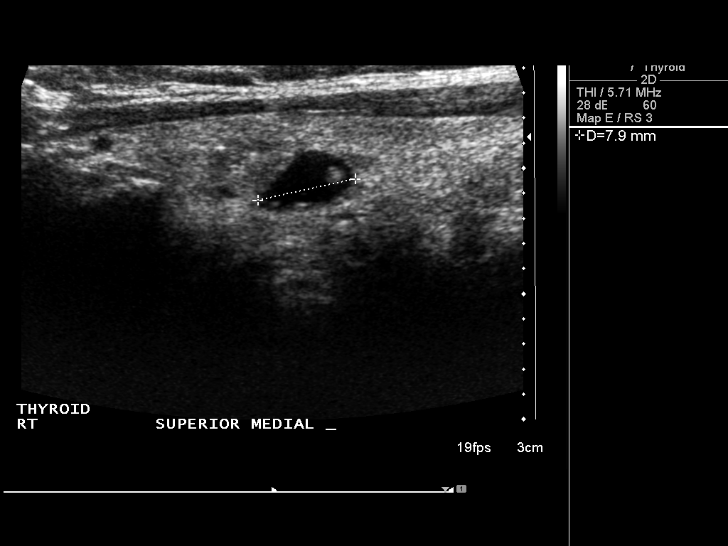
[im 22/74]
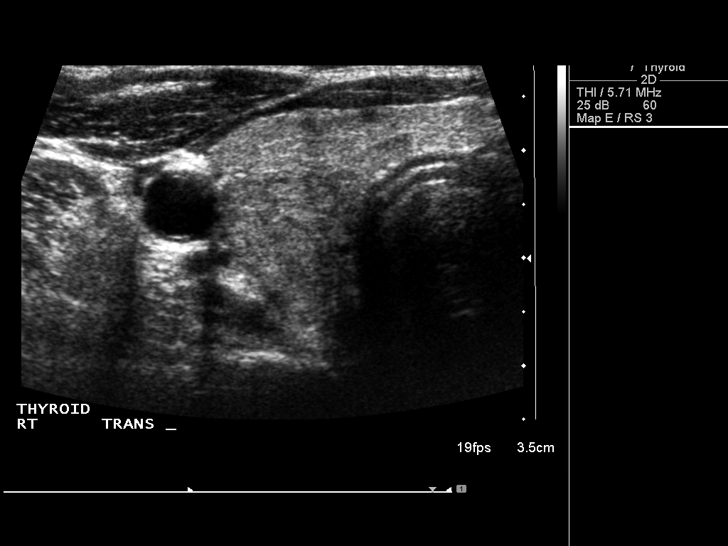
[im 28/74]
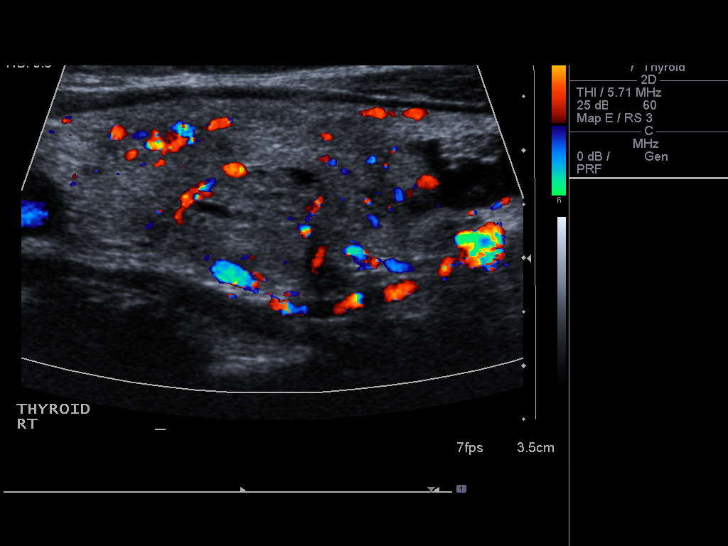
[im 34/74]
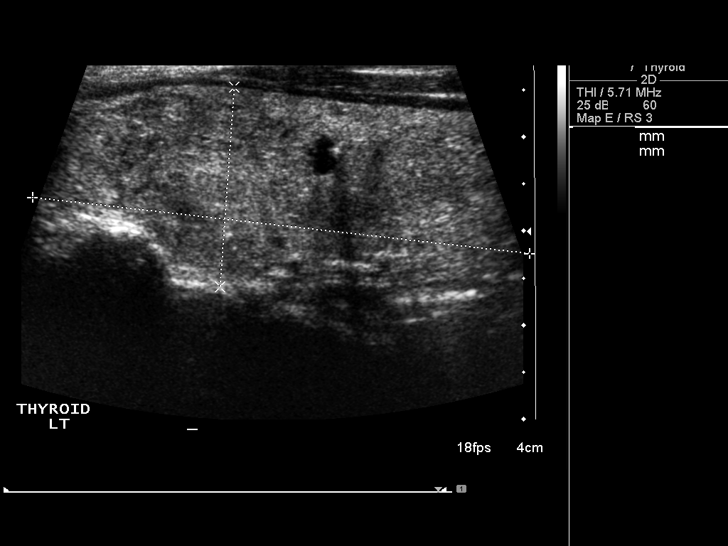
[im 40/74]
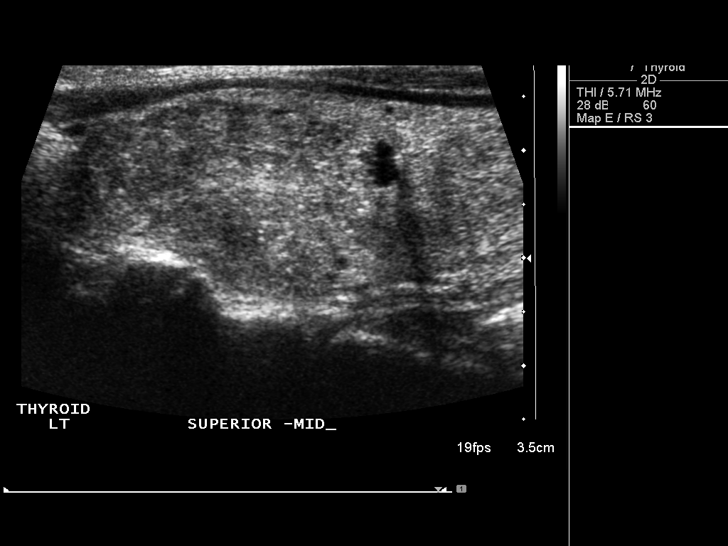
[im 46/74]
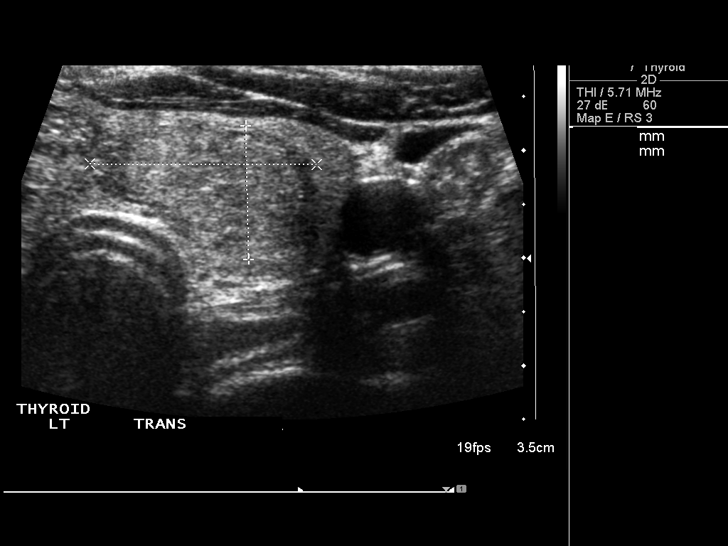
[im 52/74]
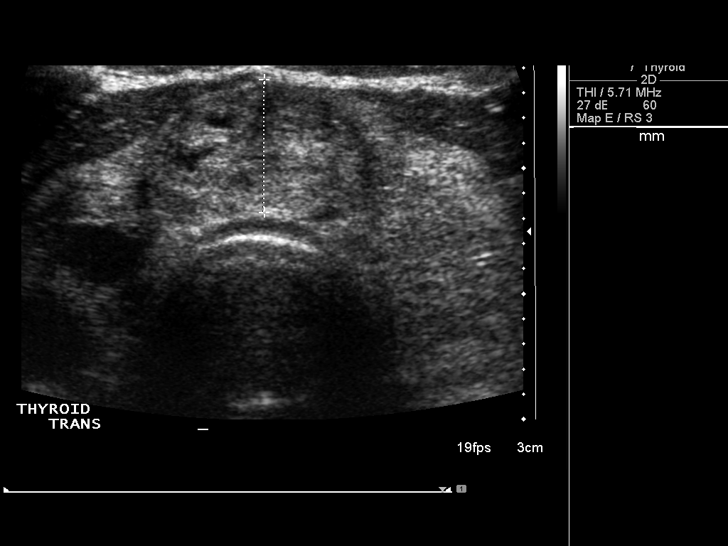
[im 58/74]
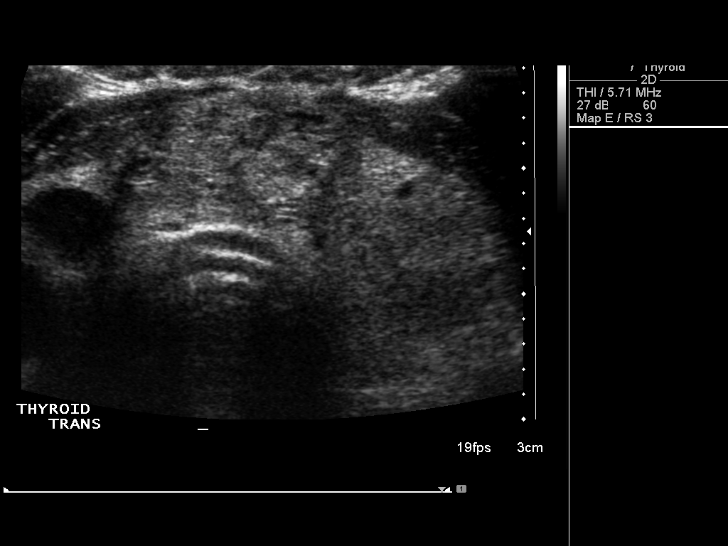
[im 64/74]
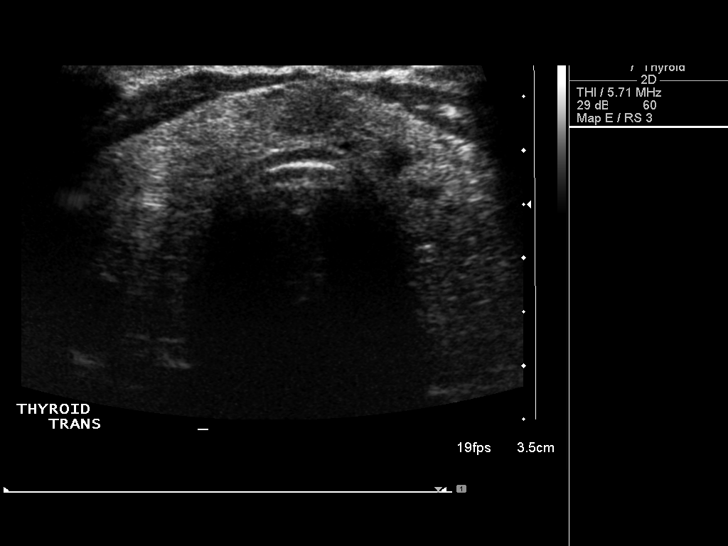
[im 70/74]
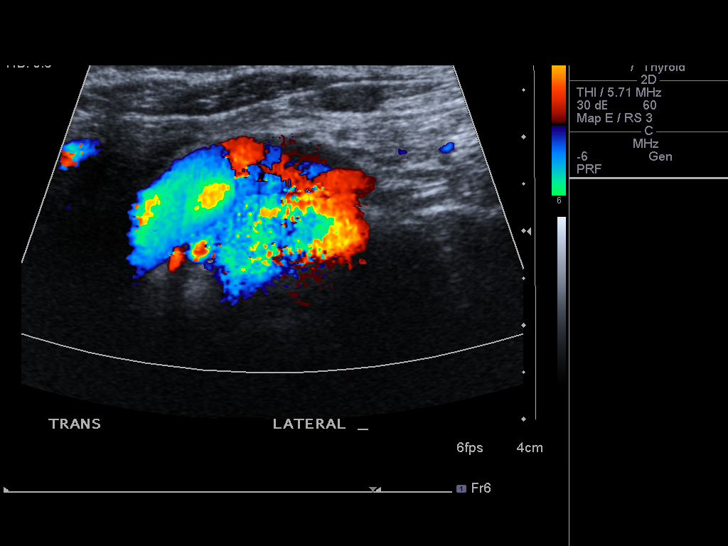

[12 of 25 positions shown; findings below may reference images not displayed]

FINDINGS: Parenchymal Echotexture: Normal

Estimated total number of nodules >/= 1 cm: 6-10

Number of spongiform nodules >/=  2 cm not described below (TR1): 0

Number of mixed cystic and solid nodules >/= 1.5 cm not described
below (TR2): 0

_________________________________________________________

Isthmus: 1.1 cm

Nodule # 1:

Location: Isthmus; Mid

Size: 2.8 x 1.1 x 2.1 cm

Composition: solid/almost completely solid (2)

Echogenicity: isoechoic (1)

Shape: not taller-than-wide (0)

Margins: smooth (0)

Echogenic foci: none (0)

ACR TI-RADS total points: 3.

ACR TI-RADS risk category: TR3 (3 points).

ACR TI-RADS recommendations:

**Given size (>/= 2.5 cm) and appearance, fine needle aspiration of
this mildly suspicious nodule should be considered based on TI-RADS
criteria.

_________________________________________________________

Right lobe: 5.0 x 2.1 x 1.6 cm

Nodule # 2:

Location: Right; Superior

Size: 1.2 x 0.6 x 1.1 cm

Composition: solid/almost completely solid (2)

Echogenicity: isoechoic (1)

Shape: not taller-than-wide (0)

Margins: smooth (0)

Echogenic foci: none (0)

ACR TI-RADS total points: 3.

ACR TI-RADS risk category: TR3 (3 points).

ACR TI-RADS recommendations:

Given size (<1.4 cm) and appearance, this nodule does NOT meet
TI-RADS criteria for biopsy or dedicated follow-up.

Nodule # 3:

Location: Right; Mid

Size: 1.5 x 1.3 x 1.4 cm

Composition: solid/almost completely solid (2)

Echogenicity: isoechoic (1)

Shape: taller-than-wide (3)

Margins: smooth (0)

Echogenic foci: none (0)

ACR TI-RADS total points: 6.

ACR TI-RADS risk category: TR4 (4-6 points).

ACR TI-RADS recommendations:

**Given size (>/= 1.5 cm) and appearance, fine needle aspiration of
this moderately suspicious nodule should be considered based on
TI-RADS criteria.

Nodule # 4:

Location: Right; Inferior

Size: 1.6 x 0.7 x 1.4 cm

Composition: mixed cystic and solid (1)

Echogenicity: isoechoic (1)

Shape: not taller-than-wide (0)

Margins: smooth (0)

Echogenic foci: none (0)

ACR TI-RADS total points: 2.

ACR TI-RADS risk category: TR2 (2 points).

ACR TI-RADS recommendations:

This nodule does NOT meet TI-RADS criteria for biopsy or dedicated
follow-up.

_________________________________________________________

Left lobe: 5.9 x 2.1 x 2.2 cm

Nodule # 5:

Location: Left; Superior

Size: 3.1 x 1.9 x 2.0 cm

Composition: solid/almost completely solid (2)

Echogenicity: isoechoic (1)

Shape: not taller-than-wide (0)

Margins: smooth (0)

Echogenic foci: none (0)

ACR TI-RADS total points: 3.

ACR TI-RADS risk category: TR3 (3 points).

ACR TI-RADS recommendations:

**Given size (>/= 2.5 cm) and appearance, fine needle aspiration of
this mildly suspicious nodule should be considered based on TI-RADS
criteria.

Nodule # 6:

Location: Left; Inferior

Size: 2.2 x 1.2 x 2.1 cm

Composition: solid/almost completely solid (2)

Echogenicity: isoechoic (1)

Shape: not taller-than-wide (0)

Margins: smooth (0)

Echogenic foci: none (0)

ACR TI-RADS total points: 3.

ACR TI-RADS risk category: TR3 (3 points).

ACR TI-RADS recommendations:

*Given size (>/= 1.5 - 2.4 cm) and appearance, a follow-up
ultrasound in 1 year should be considered based on TI-RADS criteria.
IMPRESSION: Multiple bilateral nodules are identified.

2.8 cm nodule in the isthmus, fine-needle aspiration is recommended.

1.5 cm right mid nodule, fine-needle aspiration is recommended

3.1 cm left upper pole nodule, fine-needle aspiration is
recommended.

2.2 cm left lower pole nodule, annual follow-up is recommended.

The above is in keeping with the ACR TI-RADS recommendations - [HOSPITAL] 0015;[DATE].

## 2018-11-20 ENCOUNTER — Other Ambulatory Visit: Payer: Self-pay

## 2018-11-20 ENCOUNTER — Ambulatory Visit: Payer: BC Managed Care – PPO

## 2019-04-07 ENCOUNTER — Ambulatory Visit (INDEPENDENT_AMBULATORY_CARE_PROVIDER_SITE_OTHER): Payer: BC Managed Care – PPO | Admitting: Family Medicine

## 2019-04-07 ENCOUNTER — Other Ambulatory Visit: Payer: Self-pay

## 2019-04-07 ENCOUNTER — Encounter: Payer: Self-pay | Admitting: Family Medicine

## 2019-04-07 VITALS — BP 120/76 | HR 84 | Temp 96.3°F | Resp 16 | Ht 63.25 in | Wt 146.0 lb

## 2019-04-07 DIAGNOSIS — E1169 Type 2 diabetes mellitus with other specified complication: Secondary | ICD-10-CM | POA: Diagnosis not present

## 2019-04-07 DIAGNOSIS — I10 Essential (primary) hypertension: Secondary | ICD-10-CM | POA: Diagnosis not present

## 2019-04-07 DIAGNOSIS — R002 Palpitations: Secondary | ICD-10-CM

## 2019-04-07 DIAGNOSIS — K219 Gastro-esophageal reflux disease without esophagitis: Secondary | ICD-10-CM

## 2019-04-07 DIAGNOSIS — G4700 Insomnia, unspecified: Secondary | ICD-10-CM

## 2019-04-07 LAB — POCT GLYCOSYLATED HEMOGLOBIN (HGB A1C): Hemoglobin A1C: 5.9 % — AB (ref 4.0–5.6)

## 2019-04-07 NOTE — Assessment & Plan Note (Addendum)
HgA1C at goal. Continue non pharmacologic treatment. Regular exercise and healthy diet with avoidance of added sugar food intake is an important part of treatment and recommended. Annual eye exam, periodic dental and foot care recommended. F/U in 5-6 months. 

## 2019-04-07 NOTE — Assessment & Plan Note (Signed)
BP adequately controlled. No changes in current management. Continue low salt diet. Recommend monitoring BP at home. She is due for eye exam.

## 2019-04-07 NOTE — Patient Instructions (Addendum)
A few things to remember from today's visit:   Palpitations - Plan: EKG 12-Lead, Ambulatory referral to Cardiology, CBC, Basic metabolic panel, TSH  Gastroesophageal reflux disease without esophagitis  Essential hypertension - Plan: CBC, Basic metabolic panel, TSH  Type 2 diabetes mellitus with other specified complication, without long-term current use of insulin ([place]) - Plan: POC HgB A1c   Please be sure medication list is accurate. If a new problem present, please set up appointment sooner than planned today.

## 2019-04-07 NOTE — Progress Notes (Signed)
HPI:  Chief Complaint  Patient presents with  . a1c  . Palpitations    [name] is a 64 y.o. female, who is here today complaining of 2 weeks of palpitations, reporting problem as new. Sometimes palpitations happen during the day when doing chores around her house but mort of the time at night when she is in bed. Last a few minutes. No associated CP,dyspnea,palpitations,or lightheadedness.  She has seen Dr Radford Pax a few years ago because CP. Echo 02/2016:  Left ventricle: The cavity size was normal. There was mild concentric hypertrophy. Systolic function was normal. The estimated ejection fraction was in the range of 60% to 65%. Wall  motion was normal; there were no regional wall motion  abnormalities.  - Mitral valve: There was mild regurgitation.  Nuclear stress test in 10/2014. The ejection fraction is normal. Wall motion is normal. There is no scar or ischemia. This is a low risk scan. Overall the study is normal.  Lab Results  Component Value Date   TSH 0.82 03/14/2017   Having difficulty sleeping,intermittent. Exacerbated by stress. Last night she slept better. Trazodone in the past caused headaches. Melatonin caused vivid dreams.  DM II: Denies abdominal pain, nausea,vomiting, polydipsia,polyuria, or polyphagia. She is not checking BS's.  Lab Results  Component Value Date   HGBA1C 5.7 (A) 09/16/2018   Lab Results  Component Value Date   MICROALBUR 0.9 02/25/2018   MICROALBUR <0.7 07/20/2017   HTN: She is on HCTZ 12.5 mg daily. Negative for unusual headache,visual changes,gross hematuria,decreased urine output,or edema. Lab Results  Component Value Date   CREATININE 0.80 06/21/2018   BUN 11 06/21/2018   NA 143 06/21/2018   K 3.5 06/21/2018   CL 103 06/21/2018   CO2 32 06/21/2018   She is not sleeping well. + Stress. Her "digestive" system is also affected. BM daily but last week she had some constipation. Hard  stool. Last bm this morning. Miralax.  GERD:+ Burping. Tomatoes sauce. No heartburn or abdominal pain.  She has gained some wt. She was walking downtown but stopped when protest. She has not been consistent with following a healthful diet.  Review of Systems  Constitutional: Positive for fatigue. Negative for activity change, appetite change and fever.  HENT: Negative for mouth sores, nosebleeds, sore throat and trouble swallowing.   Eyes: Negative for redness and visual disturbance.  Respiratory: Negative for cough and wheezing.   Gastrointestinal: Negative for nausea and vomiting.       Negative for changes in bowel habits.  Skin: Negative for rash and wound.  Neurological: Negative for syncope and weakness.  Psychiatric/Behavioral: Negative for confusion. The patient is nervous/anxious.   Rest see pertinent positives and negatives per HPI.   Current Outpatient Medications on File Prior to Visit  Medication Sig Dispense Refill  . atorvastatin (LIPITOR) 20 MG tablet Take 1 tablet (20 mg total) by mouth daily. 90 tablet 3  . Cholecalciferol (VITAMIN D) 50 MCG (2000 UT) CAPS Take by mouth daily.    . clobetasol ointment (TEMOVATE) 0.05 % Apply thinly to affected area one daily for no longer than one week if symptoms have stopped. 60 g 0  . fluticasone (FLONASE) 50 MCG/ACT nasal spray Place into the nose as needed.    . hydrochlorothiazide (MICROZIDE) 12.5 MG capsule TAKE 1 CAPSULE BY MOUTH EVERY DAY 90 capsule 3  . ibuprofen (ADVIL,MOTRIN) 200 MG tablet Take by mouth.    . loratadine (CLARITIN) 10  MG tablet Take 10 mg by mouth daily as needed for allergies.    . Multiple Vitamin (MULTIVITAMIN) tablet Take 1 tablet by mouth daily.     Current Facility-Administered Medications on File Prior to Visit  Medication Dose Route Frequency Provider Last Rate Last Admin  . technetium sestamibi generic (CARDIOLITE) injection 10.1 milli [place]ie  [id number] millicurie Intravenous Once PRN Carlena Bjornstad, MD         Past Medical History:  Diagnosis Date  . Abnormal Pap smear of cervix    neg HPV HR+ 16/18 neg 2017, 2018 LGSIL HPV-, 2019 ASCUS HPV HR+, 08-21-2018 LGSIL HPV HR-  . Anxiety   . Depression   . Diabetes mellitus without complication ([place])   . Dyspareunia   . History of colon polyps   . Hyperlipidemia   . Hypertension   . Lichen sclerosus   . Thyroid disease    Allergies  Allergen Reactions  . Hydrocodone Nausea And Vomiting    Social History   Socioeconomic History  . Marital status: Married    Spouse name: Not on file  . Number of children: 2  . Years of education: Not on file  . Highest education level: Not on file  Occupational History  . Not on file  Tobacco Use  . Smoking status: Never Smoker  . Smokeless tobacco: Never Used  Substance and Sexual Activity  . Alcohol use: No    Alcohol/week: 0.0 standard drinks  . Drug use: No  . Sexual activity: Yes    Partners: Male    Birth control/protection: Surgical    Comment: Tubal ligation  Other Topics Concern  . Not on file  Social History Narrative  . Not on file   Social Determinants of Health   Financial Resource Strain:   . Difficulty of Paying Living Expenses: Not on file  Food Insecurity:   . Worried About Charity fundraiser in the Last Year: Not on file  . Ran Out of Food in the Last Year: Not on file  Transportation Needs:   . Lack of Transportation (Medical): Not on file  . Lack of Transportation (Non-Medical): Not on file  Physical Activity:   . Days of Exercise per Week: Not on file  . Minutes of Exercise per Session: Not on file  Stress:   . Feeling of Stress : Not on file  Social Connections:   . Frequency of Communication with Friends and Family: Not on file  . Frequency of Social Gatherings with Friends and Family: Not on file  . Attends Religious Services: Not on file  . Active Member of Clubs or Organizations: Not on file  . Attends Archivist Meetings:  Not on file  . Marital Status: Not on file    Vitals:   04/07/19 1004  BP: 120/76  Pulse: 84  Resp: 16  Temp: (!) 96.3 F (35.7 C)  SpO2: 98%   Wt Readings from Last 3 Encounters:  04/07/19 146 lb (66.2 kg)  11/08/18 140 lb 3.2 oz (63.6 kg)  10/08/18 143 lb (64.9 kg)    Body mass index is 25.66 kg/m.  Physical Exam  Nursing note and vitals reviewed. Constitutional: She is oriented to person, place, and time. She appears well-developed and well-nourished. No distress.  HENT:  Head: Normocephalic and atraumatic.  Mouth/Throat: Oropharynx is clear and moist and mucous membranes are normal.  Eyes: Pupils are equal, round, and reactive to light. Conjunctivae are normal.  Cardiovascular: Normal rate and  regular rhythm.  No murmur heard. Pulses:      Dorsalis pedis pulses are 2+ on the right side and 2+ on the left side.  Respiratory: Effort normal and breath sounds normal. No respiratory distress.  GI: Soft. She exhibits no mass. There is no hepatomegaly. There is no abdominal tenderness.  Musculoskeletal:        General: No edema.  Lymphadenopathy:    She has no cervical adenopathy.  Neurological: She is alert and oriented to person, place, and time. She has normal strength. No cranial nerve deficit. Gait normal.  Skin: Skin is warm. No rash noted. No erythema.  Psychiatric: Her mood appears anxious.  Well groomed, good eye contact.    ASSESSMENT AND PLAN:  Ms. Mariauna was seen today for a1c and palpitations.  Diagnoses and all orders for this visit:  Orders Placed This Encounter  Procedures  . CBC  . Basic metabolic panel  . TSH  . Ambulatory referral to Cardiology  . POC HgB A1c  . EKG 12-Lead    Palpitations Possible etiologies discussed. Hx and examination do not suggest a serious process. EKG today:SR,normal axis and intervals. No significant changes when compared with EKG 03/16/16. Instructed about warning signs. Lab appt will be  arranged.  Gastroesophageal reflux disease without esophagitis GERD precautions recommended. We could try PPI if symptoms are persistent.  Essential hypertension BP adequately controlled. No changes in current management. Continue low salt diet. Recommend monitoring BP at home. She is due for eye exam.  Type 2 diabetes mellitus with other specified complication ([place]) 123456 at goal. Continue non pharmacologic treatment. Regular exercise and healthy diet with avoidance of added sugar food intake is an important part of treatment and recommended. Annual eye exam, periodic dental and foot care recommended. F/U in 5-6 months   Insomnia, unspecified type Slept better last night. Good sleep hygiene recommended for now.   Return in about 6 months (around 10/05/2019) for cpe. Last later this week..   Adrie Picking G. [place], MD  [facility]. [place] office.

## 2019-04-10 ENCOUNTER — Other Ambulatory Visit: Payer: BC Managed Care – PPO

## 2019-04-14 ENCOUNTER — Other Ambulatory Visit: Payer: Self-pay

## 2019-04-14 ENCOUNTER — Other Ambulatory Visit (INDEPENDENT_AMBULATORY_CARE_PROVIDER_SITE_OTHER): Payer: BC Managed Care – PPO

## 2019-04-14 DIAGNOSIS — I10 Essential (primary) hypertension: Secondary | ICD-10-CM

## 2019-04-14 DIAGNOSIS — R002 Palpitations: Secondary | ICD-10-CM | POA: Diagnosis not present

## 2019-04-14 LAB — CBC
HCT: 41.6 % (ref 36.0–46.0)
Hemoglobin: 13.4 g/dL (ref 12.0–15.0)
MCHC: 32.2 g/dL (ref 30.0–36.0)
MCV: 82.3 fl (ref 78.0–100.0)
Platelets: 169 10*3/uL (ref 150.0–400.0)
RBC: 5.05 Mil/uL (ref 3.87–5.11)
RDW: 14.2 % (ref 11.5–15.5)
WBC: 5.5 10*3/uL (ref 4.0–10.5)

## 2019-04-14 LAB — TSH: TSH: 0.45 u[IU]/mL (ref 0.35–4.50)

## 2019-04-15 ENCOUNTER — Ambulatory Visit: Payer: BC Managed Care – PPO | Admitting: Cardiology

## 2019-04-15 ENCOUNTER — Other Ambulatory Visit: Payer: BC Managed Care – PPO

## 2019-04-15 LAB — BASIC METABOLIC PANEL
BUN: 13 mg/dL (ref 6–23)
CO2: 32 mEq/L (ref 19–32)
Calcium: 9.4 mg/dL (ref 8.4–10.5)
Chloride: 103 mEq/L (ref 96–112)
Creatinine, Ser: 0.85 mg/dL (ref 0.40–1.20)
GFR: 81.31 mL/min (ref >60.00)
Glucose, Bld: 96 mg/dL (ref 70–99)
Potassium: 3.7 mEq/L (ref 3.5–5.1)
Sodium: 143 mEq/L (ref 135–145)

## 2019-04-23 ENCOUNTER — Ambulatory Visit: Payer: BC Managed Care – PPO | Admitting: Cardiology

## 2019-04-30 ENCOUNTER — Other Ambulatory Visit: Payer: Self-pay

## 2019-04-30 ENCOUNTER — Ambulatory Visit (INDEPENDENT_AMBULATORY_CARE_PROVIDER_SITE_OTHER): Payer: BC Managed Care – PPO | Admitting: Cardiology

## 2019-04-30 ENCOUNTER — Encounter: Payer: Self-pay | Admitting: Cardiology

## 2019-04-30 VITALS — BP 126/72 | HR 87 | Temp 97.4°F | Resp 21 | Ht 63.25 in | Wt 144.4 lb

## 2019-04-30 DIAGNOSIS — Z7189 Other specified counseling: Secondary | ICD-10-CM | POA: Diagnosis not present

## 2019-04-30 DIAGNOSIS — I10 Essential (primary) hypertension: Secondary | ICD-10-CM

## 2019-04-30 DIAGNOSIS — R002 Palpitations: Secondary | ICD-10-CM | POA: Diagnosis not present

## 2019-04-30 DIAGNOSIS — E785 Hyperlipidemia, unspecified: Secondary | ICD-10-CM | POA: Diagnosis not present

## 2019-04-30 NOTE — Patient Instructions (Addendum)
Medication Instructions:  Your Physician recommend you continue on your current medication as directed.    *If you need a refill on your cardiac medications before your next appointment, please call your pharmacy*   Lab Work: None   Testing/Procedures: None   Follow-Up: At [facility], you and your health needs are our priority.  As part of our continuing mission to provide you with exceptional heart care, we have created designated Provider Care Teams.  These Care Teams include your primary Cardiologist (physician) and Advanced Practice Providers (APPs -  Physician Assistants and Nurse Practitioners) who all work together to provide you with the care you need, when you need it.  We recommend signing up for the patient portal called "MyChart".  Sign up information is provided on this After Visit Summary.  MyChart is used to connect with patients for Virtual Visits (Telemedicine).  Patients are able to view lab/test results, encounter notes, upcoming appointments, etc.  Non-urgent messages can be sent to your provider as well.   To learn more about what you can do with MyChart, go to [email or web address].    Your next appointment:   As needed  The format for your next appointment:   Either In Person or Virtual  Provider:   Buford Dresser, MD   Other Instructions Look into [employer]

## 2019-04-30 NOTE — Progress Notes (Signed)
Cardiology Office Note:    Date:  04/30/2019   ID:  Nancy, Chandler [birth year], MRN [id number]  PCP:  Martinique, Betty G, MD  Cardiologist:  Buford Dresser, MD  Referring MD: Martinique, Betty G, MD   CC: new patient consultation for palpitations  History of Present Illness:    Nancy Chandler is a 64 y.o. female with a hx of hypertension who is seen as a new consult at the request of Martinique, Malka So, MD for the evaluation and management of palpitations.  Most recent note dated 04/07/19 from Dr. Martinique reviewed. Endorsed 2 weeks of palpitations. Most commonly at night, but also sometimes during the day. No associated symptoms. Lasts a few minutes and resolves.  Tachycardia/palpitations: -Initial onset: started 02/2019. Was stressed with pandemic, brother had a stroke due to Covid.  -Frequency/Duration: noted mostly at night, like her heart was running out of her chest. Not every night, but intermittent.  -Associated symptoms: no chest pain, no shortness of breath, no nausea/vomiting, no diaphoresis -Aggravating/alleviating factors: Notices it when she lays down. Does not notice other times of day. Nonexertional. Better with relaxing/taking deep breaths. -Syncope/near syncope: none -Prior cardiac history: no formal diagnosis -Prior workup: echo and stress test normal -Prior treatment: none -Possible medication interactions: none -Caffeine: coke zero, 1 glass/day. Occasional coffee -Alcohol: none -Tobacco: never -OTC supplements: none -Comorbidities: told she had prediabetes, but not full diabetes. Has never been on medication. Has HTN. -Exercise level: was walking more, but has dropped off in recent months -Labs: TSH, kidney function/electrolytes, CBC reviewed. -Cardiac ROS: no chest pain, no shortness of breath, no PND, no orthopnea, no LE edema. -Family history: both of her grandmothers had congestive heart failure.  Past Medical History:  Diagnosis Date  .  Abnormal Pap smear of cervix    neg HPV HR+ 16/18 neg 2017, 2018 LGSIL HPV-, 2019 ASCUS HPV HR+, 08-21-2018 LGSIL HPV HR-  . Anxiety   . Depression   . Diabetes mellitus without complication ([place])   . Dyspareunia   . History of colon polyps   . Hyperlipidemia   . Hypertension   . Lichen sclerosus   . Thyroid disease     Past Surgical History:  Procedure Laterality Date  . COLPOSCOPY  2010, 2018, 2019   History of LSIL, ASCUS + HPV  . LYMPH NODE BIOPSY    . TUBAL LIGATION  1990    Current Medications: Current Outpatient Medications on File Prior to Visit  Medication Sig  . atorvastatin (LIPITOR) 20 MG tablet Take 1 tablet (20 mg total) by mouth daily.  . Cholecalciferol (VITAMIN D) 50 MCG (2000 UT) CAPS Take by mouth daily.  . clobetasol ointment (TEMOVATE) 0.05 % Apply thinly to affected area one daily for no longer than one week if symptoms have stopped.  . fluticasone (FLONASE) 50 MCG/ACT nasal spray Place into the nose as needed.  . hydrochlorothiazide (MICROZIDE) 12.5 MG capsule TAKE 1 CAPSULE BY MOUTH EVERY DAY  . ibuprofen (ADVIL,MOTRIN) 200 MG tablet Take by mouth.  . loratadine (CLARITIN) 10 MG tablet Take 10 mg by mouth daily as needed for allergies.  . Multiple Vitamin (MULTIVITAMIN) tablet Take 1 tablet by mouth daily.   No current facility-administered medications on file prior to visit.     Allergies:   Hydrocodone   Social History   Tobacco Use  . Smoking status: Never Smoker  . Smokeless tobacco: Never Used  Substance Use Topics  . Alcohol use: No  Alcohol/week: 0.0 standard drinks  . Drug use: No    Family History: family history includes Alcohol abuse in her father; Arthritis in her maternal grandmother; Birth defects in her daughter; Breast cancer in her paternal aunt; Cancer in her maternal grandfather and mother; Diabetes in her father; Hearing loss in her brother; Heart disease in her maternal grandmother, paternal aunt, and paternal grandmother;  Hyperlipidemia in her brother; Hypertension in her brother and father.  ROS:   Please see the history of present illness.  Additional pertinent ROS: Constitutional: Negative for chills, fever, night sweats, unintentional weight loss  HENT: Negative for ear pain and hearing loss.   Eyes: Negative for loss of vision and eye pain.  Respiratory: Negative for cough, sputum, wheezing.   Cardiovascular: See HPI. Gastrointestinal: Negative for abdominal pain, melena, and hematochezia.  Genitourinary: Negative for dysuria and hematuria.  Musculoskeletal: Negative for falls and myalgias.  Skin: Negative for itching and rash.  Neurological: Negative for focal weakness, focal sensory changes and loss of consciousness.  Endo/Heme/Allergies: Does not bruise/bleed easily.     EKGs/Labs/Other Studies Reviewed:    The following studies were reviewed today: Echo 02/2016:  Left ventricle: The cavity size was normal. There was mild concentric hypertrophy. Systolic function was normal. The estimated ejection fraction was in the range of 60% to 65%. Wall  motion was normal; there were no regional wall motion  abnormalities.  - Mitral valve: There was mild regurgitation.  Nuclear stress test in 10/2014. The ejection fraction is normal. Wall motion is normal. There is no scar or ischemia. This is a low risk scan. Overall the study is normal.  EKG:  EKG is personally reviewed.  The ekg ordered today demonstrates NSR  Recent Labs: 04/14/2019: BUN 13; Creatinine, Ser 0.85; Hemoglobin 13.4; Platelets 169.0; Potassium 3.7; Sodium 143; TSH 0.45  Recent Lipid Panel    Component Value Date/Time   CHOL 163 02/25/2018 0809   TRIG 59.0 02/25/2018 0809   HDL 54.80 02/25/2018 0809   CHOLHDL 3 02/25/2018 0809   VLDL 11.8 02/25/2018 0809   LDLCALC 97 02/25/2018 0809    Physical Exam:    VS:  BP 126/72   Pulse 87   Temp (!) 97.4 F (36.3 C)   Resp (!) 21   Ht 5' 3.25" (1.607 m)   Wt 144 lb 6.4  oz (65.5 kg)   LMP 02/21/2008   SpO2 97%   BMI 25.38 kg/m     Wt Readings from Last 3 Encounters:  04/30/19 144 lb 6.4 oz (65.5 kg)  04/07/19 146 lb (66.2 kg)  11/08/18 140 lb 3.2 oz (63.6 kg)    GEN: Well nourished, well developed in no acute distress HEENT: Normal, moist mucous membranes NECK: No JVD CARDIAC: regular rhythm, normal S1 and S2, no rubs or gallops. No murmurs. VASCULAR: Radial and DP pulses 2+ bilaterally. No carotid bruits RESPIRATORY:  Clear to auscultation without rales, wheezing or rhonchi  ABDOMEN: Soft, non-tender, non-distended MUSCULOSKELETAL:  Ambulates independently SKIN: Warm and dry, no edema NEUROLOGIC:  Alert and oriented x 3. No focal neuro deficits noted. PSYCHIATRIC:  Normal affect    ASSESSMENT:    1. Palpitation   2. Essential hypertension   3. Hyperlipidemia, unspecified hyperlipidemia type   4. Cardiac risk counseling   5. Counseling on health promotion and disease prevention    PLAN:    Palpitations: -we discussed the potential causes of fast heart rates and palpitations today. Reviewed the normal electrical system of the heart.  Reviewed the role of the sinus node. Reviewed the balance between resting (vagal) tone and fight or flight nervous system input. Reviewed how exercise improves vagal tone and lowers resting heart rate. Reviewed that sinus tachycardia, or elevated sinus rate, is usually secondary to something else in the body. This can include pain, stress, infection, anxiety, hormone imbalance, low blood counts, etc. Discussed that we do not typically treat sinus tachycardia by itself, and instead the focus is on finding what is driving the heart rate and treating that. We discussed that there can be other rhythm issues, from either the top or bottom of the heart, that are abnormal rhythms. Discussed how we evaluate for these. -we discussed both an event monitor and a KardiaMobile device today. She will look into the [facility].  Instructed on how to send strips to me via mychart  Hypertension: well controlled today -continue HCTZ 12.5 mg daily  Hyperlipidemia: lipids reviewed from 02/2018 -no known history of ASCVD, reasonable to aim for LDL ~100 -last LDL 97 -continue atorvastatin 20 mg daily  Cardiac risk counseling and prevention recommendations: -recommend heart healthy/Mediterranean diet, with whole grains, fruits, vegetable, fish, lean meats, nuts, and olive oil. Limit salt. -recommend moderate walking, 3-5 times/week for 30-50 minutes each session. Aim for at least 150 minutes.week. Goal should be pace of 3 miles/hours, or walking 1.5 miles in 30 minutes -recommend avoidance of tobacco products. Avoid excess alcohol. -ASCVD risk score: The 10-year ASCVD risk score Mikey Bussing DC Brooke Bonito., et al., 2013) is: 15.8%   Values used to calculate the score:     Age: 36 years     Sex: Female     Is Non-Hispanic African American: Yes     Diabetic: Yes     Tobacco smoker: No     Systolic Blood Pressure: 123XX123 mmHg     Is BP treated: Yes     HDL Cholesterol: 54.8 mg/dL     Total Cholesterol: 163 mg/dL    Plan for follow up: as needed, based on results of Kardiamobile strips  Buford Dresser, MD, PhD [place]  [facility]    Medication Adjustments/Labs and Tests Ordered: Current medicines are reviewed at length with the patient today.  Concerns regarding medicines are outlined above.  Orders Placed This Encounter  Procedures  . EKG 12-Lead   No orders of the defined types were placed in this encounter.   Patient Instructions  Medication Instructions:  Your Physician recommend you continue on your current medication as directed.    *If you need a refill on your cardiac medications before your next appointment, please call your pharmacy*   Lab Work: None   Testing/Procedures: None   Follow-Up: At [facility], you and your health needs are our priority.  As part of our continuing mission to  provide you with exceptional heart care, we have created designated Provider Care Teams.  These Care Teams include your primary Cardiologist (physician) and Advanced Practice Providers (APPs -  Physician Assistants and Nurse Practitioners) who all work together to provide you with the care you need, when you need it.  We recommend signing up for the patient portal called "MyChart".  Sign up information is provided on this After Visit Summary.  MyChart is used to connect with patients for Virtual Visits (Telemedicine).  Patients are able to view lab/test results, encounter notes, upcoming appointments, etc.  Non-urgent messages can be sent to your provider as well.   To learn more about what you can do with  MyChart, go to [email or web address].    Your next appointment:   As needed  The format for your next appointment:   Either In Person or Virtual  Provider:   Buford Dresser, MD   Other Instructions Look into [employer] KardiaMobile   Signed, Buford Dresser, MD PhD 04/30/2019 5:27 PM    [place]

## 2019-05-01 ENCOUNTER — Other Ambulatory Visit: Payer: Self-pay | Admitting: Family Medicine

## 2019-05-01 DIAGNOSIS — I10 Essential (primary) hypertension: Secondary | ICD-10-CM

## 2019-05-13 ENCOUNTER — Ambulatory Visit: Payer: BC Managed Care – PPO | Admitting: Obstetrics & Gynecology

## 2019-05-13 ENCOUNTER — Encounter: Payer: Self-pay | Admitting: Certified Nurse Midwife

## 2019-05-15 ENCOUNTER — Ambulatory Visit: Payer: BC Managed Care – PPO | Admitting: Obstetrics & Gynecology

## 2019-05-30 ENCOUNTER — Other Ambulatory Visit: Payer: Self-pay

## 2019-05-30 NOTE — Progress Notes (Signed)
GYNECOLOGY  VISIT  CC:   Repeat pap smear, h/o LEEP  HPI: 64 y.o. G55P2002 Married Nancy Chandler American female here for pap with hpv testing. Pap 08-21-2018 LGSIL HPV HR neg, LEEP 10-08-2018 CIN1 all margins negative.  Denies vaginal bleeding or discharge.   Has questions about ca-125 test.  Had this done in 2017 and this was 7.    GYNECOLOGIC HISTORY: Patient's last menstrual period was 02/21/2008. Contraception: BTL Menopausal hormone therapy: none  Patient Active Problem List   Diagnosis Date Noted  . Insomnia, unspecified 08/19/2017  . Anxiety disorder, unspecified 08/19/2017  . Multiple thyroid nodules 03/14/2017  . Syncope 03/15/2016  . Headache 03/15/2016  . Chest pain 10/13/2014  . GERD (gastroesophageal reflux disease) 10/13/2014  . Type 2 diabetes mellitus with other specified complication ([place]) [id number]  . Hyperlipidemia 12/08/2013  . Lichen sclerosus et atrophicus of the vulva 06/09/2013    Class: Diagnosis of  . Essential hypertension 05/12/2013    Class: History of    Past Medical History:  Diagnosis Date  . Abnormal Pap smear of cervix    neg HPV HR+ 16/18 neg 2017, 2018 LGSIL HPV-, 2019 ASCUS HPV HR+, 08-21-2018 LGSIL HPV HR-  . Anxiety   . Depression   . Diabetes mellitus without complication ([place])   . Dyspareunia   . History of colon polyps   . Hyperlipidemia   . Hypertension   . Lichen sclerosus   . Thyroid disease     Past Surgical History:  Procedure Laterality Date  . COLPOSCOPY  2010, 2018, 2019   History of LSIL, ASCUS + HPV  . LYMPH NODE BIOPSY    . TUBAL LIGATION  1990    MEDS:   Current Outpatient Medications on File Prior to Visit  Medication Sig Dispense Refill  . atorvastatin (LIPITOR) 20 MG tablet Take 1 tablet (20 mg total) by mouth daily. 90 tablet 3  . Cholecalciferol (VITAMIN D) 50 MCG (2000 UT) CAPS Take by mouth daily.    . clobetasol ointment (TEMOVATE) 0.05 % Apply thinly to affected area one daily for no longer than  one week if symptoms have stopped. 60 g 0  . fluticasone (FLONASE) 50 MCG/ACT nasal spray Place into the nose as needed.    . hydrochlorothiazide (MICROZIDE) 12.5 MG capsule TAKE 1 CAPSULE BY MOUTH EVERY DAY 90 capsule 1  . ibuprofen (ADVIL,MOTRIN) 200 MG tablet Take by mouth.    . loratadine (CLARITIN) 10 MG tablet Take 10 mg by mouth daily as needed for allergies.    . Multiple Vitamin (MULTIVITAMIN) tablet Take 1 tablet by mouth daily.     No current facility-administered medications on file prior to visit.    ALLERGIES: Hydrocodone  Family History  Problem Relation Age of Onset  . Hypertension Brother   . Hearing loss Brother   . Hyperlipidemia Brother   . Cancer Mother        colon  . Hypertension Father   . Alcohol abuse Father   . Diabetes Father   . Heart disease Maternal Grandmother        CHF  . Arthritis Maternal Grandmother   . Cancer Maternal Grandfather        kidney  . Heart disease Paternal Grandmother        CHF  . Birth defects Daughter   . Breast cancer Paternal Aunt   . Heart disease Paternal Aunt        open heart surgery    SH:  Married, non smoker  Review of Systems  Constitutional: Negative.   HENT: Negative.   Eyes: Negative.   Respiratory: Negative.   Cardiovascular: Negative.   Gastrointestinal: Negative.   Endocrine: Negative.   Genitourinary: Negative.   Musculoskeletal: Negative.   Skin: Negative.   Allergic/Immunologic: Negative.   Neurological: Negative.   Psychiatric/Behavioral: Negative.     PHYSICAL EXAMINATION:   Vitals:   06/02/19 0926  BP: 120/70  Pulse: 68  Resp: 16  Temp: (!) 97.2 F (36.2 C)     General appearance: alert, cooperative and appears stated age Lymph:  no inguinal LAD noted  Pelvic: External genitalia:  no lesions              Urethra:  normal appearing urethra with no masses, tenderness or lesions              Bartholins and Skenes: normal                 Vagina: normal appearing vagina with  normal color and discharge, no lesions              Cervix: no lesions              Bimanual Exam:  Uterus:  normal size, contour, position, consistency, mobility, non-tender              Adnexa: no mass, fullness, tenderness  Chaperone, Dorethea Clan, CMA, was present for exam.  Assessment: H/o CIN 1 s/p LEEP Desires ca-125 testing.  Pros/concs of doing this discussed.  Plan: Pap and HR HPV obtained today Ca-125 obtained today.

## 2019-06-02 ENCOUNTER — Other Ambulatory Visit (HOSPITAL_COMMUNITY)
Admission: RE | Admit: 2019-06-02 | Discharge: 2019-06-02 | Disposition: A | Discharge status: Home / Self Care | Payer: BC Managed Care – PPO | Source: Ambulatory Visit | Attending: Obstetrics & Gynecology | Admitting: Obstetrics & Gynecology

## 2019-06-02 ENCOUNTER — Other Ambulatory Visit: Payer: Self-pay

## 2019-06-02 ENCOUNTER — Ambulatory Visit (INDEPENDENT_AMBULATORY_CARE_PROVIDER_SITE_OTHER): Payer: BC Managed Care – PPO | Admitting: Obstetrics & Gynecology

## 2019-06-02 ENCOUNTER — Encounter: Payer: Self-pay | Admitting: Obstetrics & Gynecology

## 2019-06-02 VITALS — BP 120/70 | HR 68 | Temp 97.2°F | Resp 16 | Wt 144.0 lb

## 2019-06-02 DIAGNOSIS — N870 Mild cervical dysplasia: Secondary | ICD-10-CM | POA: Insufficient documentation

## 2019-06-02 DIAGNOSIS — Z9889 Other specified postprocedural states: Secondary | ICD-10-CM | POA: Diagnosis not present

## 2019-06-02 DIAGNOSIS — Z800 Family history of malignant neoplasm of digestive organs: Secondary | ICD-10-CM | POA: Diagnosis not present

## 2019-06-02 DIAGNOSIS — Z803 Family history of malignant neoplasm of breast: Secondary | ICD-10-CM

## 2019-06-03 LAB — CYTOLOGY - PAP
Adequacy: ABSENT
Comment: NEGATIVE
Diagnosis: REACTIVE
High risk HPV: NEGATIVE

## 2019-06-03 LAB — CA 125: Cancer Antigen (CA) 125: 9 U/mL (ref 0.0–38.1)

## 2019-06-04 ENCOUNTER — Other Ambulatory Visit: Payer: Self-pay

## 2019-06-04 MED ORDER — FLUCONAZOLE 150 MG PO TABS: ORAL_TABLET | ORAL | 0 refills | Status: DC

## 2019-06-09 ENCOUNTER — Telehealth: Payer: Self-pay | Admitting: Family Medicine

## 2019-06-09 ENCOUNTER — Other Ambulatory Visit: Payer: Self-pay | Admitting: *Deleted

## 2019-06-09 NOTE — Telephone Encounter (Signed)
Pt call and stated she need a Epic Pen call in to  [id number] - [place], [place] Phone:  [phone]  Fax:  [phone]      She stated that she lost her pen.

## 2019-06-09 NOTE — Telephone Encounter (Signed)
Message Routed to PCP for review and approval. 

## 2019-06-10 NOTE — Telephone Encounter (Signed)
I do not see Epi Pen in her med list nor indication. Can she please tells [place] about indication. Thanks, BJ

## 2019-06-11 ENCOUNTER — Other Ambulatory Visit: Payer: Self-pay | Admitting: Family Medicine

## 2019-06-11 DIAGNOSIS — Z91030 Bee allergy status: Secondary | ICD-10-CM

## 2019-06-11 MED ORDER — EPINEPHRINE 0.3 MG/0.3ML IJ SOAJ: 0.3000 mg | INTRAMUSCULAR | 1 refills | Status: DC | PRN

## 2019-06-11 NOTE — Telephone Encounter (Signed)
Noted  

## 2019-06-11 NOTE — Telephone Encounter (Signed)
Rx sent. Thanks, BJ 

## 2019-06-11 NOTE — Telephone Encounter (Signed)
Spoke with patient and she stated that she has had an Epi-pen for years due to bee allergy. Patient stated that she is going out of town and would like Rx sent in today. Epi-pen is on med list under discontinued medications.

## 2019-08-25 ENCOUNTER — Ambulatory Visit: Payer: Self-pay | Admitting: Certified Nurse Midwife

## 2019-08-26 ENCOUNTER — Ambulatory Visit: Payer: Self-pay | Admitting: Certified Nurse Midwife

## 2019-09-22 NOTE — Progress Notes (Deleted)
64 y.o. G10P2002 Married [place] American female here for annual exam.    Patient's last menstrual period was 02/21/2008.          Sexually active: {yes no:314532}  The current method of family planning is tubal ligation.    Exercising: {yes no:314532}  {types:19826} Smoker:  no  Health Maintenance: Pap:   06/02/19 Neg:Neg HPV HR   08-21-18 LGSIL:Neg HPV HR  05-25-17 ASCUS:Pos HPV HR   05-24-16 LGSIL:Neg HPV HR History of abnormal Pap:  yes MMG:  01/28/19 BIRADS 1 negative/density b Colonoscopy:  2018 f/u 102yrs BMD:   2012 TDaP:  2015 Pneumonia vaccine(s):  2018 Shingrix:   No Hep C testing: Negative in 2016 Screening Labs: ***   reports that she has never smoked. She has never used smokeless tobacco. She reports that she does not drink alcohol and does not use drugs.  Past Medical History:  Diagnosis Date  . Abnormal Pap smear of cervix    neg HPV HR+ 16/18 neg 2017, 2018 LGSIL HPV-, 2019 ASCUS HPV HR+, 08-21-2018 LGSIL HPV HR-  . Anxiety   . Depression   . Diabetes mellitus without complication ([place])   . Dyspareunia   . History of colon polyps   . Hyperlipidemia   . Hypertension   . Lichen sclerosus   . Thyroid disease     Past Surgical History:  Procedure Laterality Date  . COLPOSCOPY  2010, 2018, 2019   History of LSIL, ASCUS + HPV  . LYMPH NODE BIOPSY    . TUBAL LIGATION  1990    Current Outpatient Medications  Medication Sig Dispense Refill  . atorvastatin (LIPITOR) 20 MG tablet Take 1 tablet (20 mg total) by mouth daily. 90 tablet 3  . Cholecalciferol (VITAMIN D) 50 MCG (2000 UT) CAPS Take by mouth daily.    . clobetasol ointment (TEMOVATE) 0.05 % Apply thinly to affected area one daily for no longer than one week if symptoms have stopped. 60 g 0  . EPINEPHrine (EPIPEN 2-PAK) 0.3 mg/0.3 mL IJ SOAJ injection Inject 0.3 mLs (0.3 mg total) into the muscle as needed for anaphylaxis. 1 each 1  . fluconazole (DIFLUCAN) 150 MG tablet Take one tablet by mouth once,  then repeat in 72 hrs if needed. 2 tablet 0  . fluticasone (FLONASE) 50 MCG/ACT nasal spray Place into the nose as needed.    . hydrochlorothiazide (MICROZIDE) 12.5 MG capsule TAKE 1 CAPSULE BY MOUTH EVERY DAY 90 capsule 1  . ibuprofen (ADVIL,MOTRIN) 200 MG tablet Take by mouth.    . loratadine (CLARITIN) 10 MG tablet Take 10 mg by mouth daily as needed for allergies.    . Multiple Vitamin (MULTIVITAMIN) tablet Take 1 tablet by mouth daily.     No current facility-administered medications for this visit.    Family History  Problem Relation Age of Onset  . Hypertension Brother   . Hearing loss Brother   . Hyperlipidemia Brother   . Cancer Mother        colon  . Hypertension Father   . Alcohol abuse Father   . Diabetes Father   . Heart disease Maternal Grandmother        CHF  . Arthritis Maternal Grandmother   . Cancer Maternal Grandfather        kidney  . Heart disease Paternal Grandmother        CHF  . Birth defects Daughter   . Breast cancer Paternal Aunt   . Heart disease Paternal  Aunt        open heart surgery    Review of Systems  Exam:   LMP 02/21/2008      General appearance: alert, cooperative and appears stated age Head: Normocephalic, without obvious abnormality, atraumatic Neck: no adenopathy, supple, symmetrical, trachea midline and thyroid {EXAM; THYROID:18604} Lungs: clear to auscultation bilaterally Breasts: {Exam; breast:13139::"normal appearance, no masses or tenderness"} Heart: regular rate and rhythm Abdomen: soft, non-tender; bowel sounds normal; no masses,  no organomegaly Extremities: extremities normal, atraumatic, no cyanosis or edema Skin: Skin color, texture, turgor normal. No rashes or lesions Lymph nodes: Cervical, supraclavicular, and axillary nodes normal. No abnormal inguinal nodes palpated Neurologic: Grossly normal   Pelvic: External genitalia:  no lesions              Urethra:  normal appearing urethra with no masses, tenderness or  lesions              Bartholins and Skenes: normal                 Vagina: normal appearing vagina with normal color and discharge, no lesions              Cervix: {exam; cervix:14595}              Pap taken: {yes no:314532} Bimanual Exam:  Uterus:  {exam; uterus:12215}              Adnexa: {exam; adnexa:12223}               Rectovaginal: Confirms               Anus:  normal sphincter tone, no lesions  Chaperone, ***Terence Lux, CMA, was present for exam.  A:  Well Woman with normal exam  P:   {plan; gyn:5269::"mammogram","pap smear","return annually or prn"}

## 2019-09-23 NOTE — Progress Notes (Deleted)
64 y.o. G45P2002 Married Nancy Chandler or [place] American female here for annual exam.    Patient's last menstrual period was 02/21/2008.          Sexually active: {yes no:314532}  The current method of family planning is tubal ligation.    Exercising: {yes no:314532}  {types:19826} Smoker:  {YES NO:22349}  Health Maintenance: Pap:  05-25-17 ASCUS HPV HR +, 08-21-2018 LGSIL HPV HR neg, 06-02-2019 neg HPV HR neg History of abnormal Pap:  Yes LEEP MMG:  01-28-2019 category b density birads 1:neg Colonoscopy:  2018 f/u 47yrs BMD:   2020 TDaP:  2015 Pneumonia vaccine(s):  2018 Shingrix:   2018 Hep C testing: neg 2016 Screening Labs: ***   reports that she has never smoked. She has never used smokeless tobacco. She reports that she does not drink alcohol and does not use drugs.  Past Medical History:  Diagnosis Date  . Abnormal Pap smear of cervix    neg HPV HR+ 16/18 neg 2017, 2018 LGSIL HPV-, 2019 ASCUS HPV HR+, 08-21-2018 LGSIL HPV HR-  . Anxiety   . Depression   . Diabetes mellitus without complication ([place])   . Dyspareunia   . History of colon polyps   . Hyperlipidemia   . Hypertension   . Lichen sclerosus   . Thyroid disease     Past Surgical History:  Procedure Laterality Date  . COLPOSCOPY  2010, 2018, 2019   History of LSIL, ASCUS + HPV  . LYMPH NODE BIOPSY    . TUBAL LIGATION  1990    Current Outpatient Medications  Medication Sig Dispense Refill  . atorvastatin (LIPITOR) 20 MG tablet Take 1 tablet (20 mg total) by mouth daily. 90 tablet 3  . Cholecalciferol (VITAMIN D) 50 MCG (2000 UT) CAPS Take by mouth daily.    . clobetasol ointment (TEMOVATE) 0.05 % Apply thinly to affected area one daily for no longer than one week if symptoms have stopped. 60 g 0  . EPINEPHrine (EPIPEN 2-PAK) 0.3 mg/0.3 mL IJ SOAJ injection Inject 0.3 mLs (0.3 mg total) into the muscle as needed for anaphylaxis. 1 each 1  . fluconazole (DIFLUCAN) 150 MG tablet Take one tablet by mouth once, then repeat  in 72 hrs if needed. 2 tablet 0  . fluticasone (FLONASE) 50 MCG/ACT nasal spray Place into the nose as needed.    . hydrochlorothiazide (MICROZIDE) 12.5 MG capsule TAKE 1 CAPSULE BY MOUTH EVERY DAY 90 capsule 1  . ibuprofen (ADVIL,MOTRIN) 200 MG tablet Take by mouth.    . loratadine (CLARITIN) 10 MG tablet Take 10 mg by mouth daily as needed for allergies.    . Multiple Vitamin (MULTIVITAMIN) tablet Take 1 tablet by mouth daily.     No current facility-administered medications for this visit.    Family History  Problem Relation Age of Onset  . Hypertension Brother   . Hearing loss Brother   . Hyperlipidemia Brother   . Cancer Mother        colon  . Hypertension Father   . Alcohol abuse Father   . Diabetes Father   . Heart disease Maternal Grandmother        CHF  . Arthritis Maternal Grandmother   . Cancer Maternal Grandfather        kidney  . Heart disease Paternal Grandmother        CHF  . Birth defects Daughter   . Breast cancer Paternal Aunt   . Heart disease Paternal Aunt  open heart surgery    Review of Systems  Exam:   LMP 02/21/2008      General appearance: alert, cooperative and appears stated age Head: Normocephalic, without obvious abnormality, atraumatic Neck: no adenopathy, supple, symmetrical, trachea midline and thyroid {EXAM; THYROID:18604} Lungs: clear to auscultation bilaterally Breasts: {Exam; breast:13139::"normal appearance, no masses or tenderness"} Heart: regular rate and rhythm Abdomen: soft, non-tender; bowel sounds normal; no masses,  no organomegaly Extremities: extremities normal, atraumatic, no cyanosis or edema Skin: Skin color, texture, turgor normal. No rashes or lesions Lymph nodes: Cervical, supraclavicular, and axillary nodes normal. No abnormal inguinal nodes palpated Neurologic: Grossly normal   Pelvic: External genitalia:  no lesions              Urethra:  normal appearing urethra with no masses, tenderness or lesions               Bartholins and Skenes: normal                 Vagina: normal appearing vagina with normal color and discharge, no lesions              Cervix: {exam; cervix:14595}              Pap taken: {yes no:314532} Bimanual Exam:  Uterus:  {exam; uterus:12215}              Adnexa: {exam; adnexa:12223}               Rectovaginal: Confirms               Anus:  normal sphincter tone, no lesions  Chaperone, ***Terence Lux, CMA, was present for exam.  A:  Well Woman with normal exam  P:   {plan; gyn:5269::"mammogram","pap smear","return annually or prn"}

## 2019-09-24 ENCOUNTER — Ambulatory Visit: Payer: Self-pay | Admitting: Obstetrics & Gynecology

## 2019-09-26 ENCOUNTER — Ambulatory Visit: Payer: Medicare Other | Admitting: Obstetrics & Gynecology

## 2019-09-26 DIAGNOSIS — Z20822 Contact with and (suspected) exposure to covid-19: Secondary | ICD-10-CM | POA: Diagnosis not present

## 2019-09-29 ENCOUNTER — Ambulatory Visit: Payer: Self-pay | Admitting: Obstetrics & Gynecology

## 2019-09-30 ENCOUNTER — Telehealth: Payer: Self-pay | Admitting: *Deleted

## 2019-09-30 NOTE — Telephone Encounter (Signed)
On: 09/25/2019 at 6:09:48 PM, patient called triage nurse line. Caller states she has received the COVID vaccine and has been exposed to [place]. 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 She had Moderna Vaccines in Feb and March 23. She states she is supposed to take her niece to [place] and she had a kidney transplant.  Care Advice Given Per Guideline HOME CARE: * You should be able to treat this at home. CALL BACK IF: * You have more questions. CARE ADVICE given per COVID-19 - Vaccine Questions and Reactions (Adult) guideline. Comments User: Claudius Sis, RN Date/Time Eilene Ghazi Time): 09/25/2019 6:19:02 PM per CDC: People who are fully vaccinated do NOT need to quarantine after contact with someone who had COVID-19 unless they have symptoms. However, fully vaccinated people should get tested 3-5 days after their exposure, even they don't have symptoms and wear a mask indoors in public for 14 days following exposure or until their test result is negative

## 2019-09-30 NOTE — Telephone Encounter (Signed)
Pt was tested & test was negative.

## 2019-10-06 NOTE — Patient Instructions (Addendum)
A few things to remember from today's visit:   Type 2 diabetes mellitus with other specified complication, without long-term current use of insulin ([place]) - Plan: POC HgB A1c, COMPLETE METABOLIC PANEL WITH GFR, Microalbumin / creatinine urine ratio  Essential hypertension - Plan: COMPLETE METABOLIC PANEL WITH GFR  Hyperlipidemia, unspecified hyperlipidemia type - Plan: Lipid panel  Routine general medical examination at a health care facility  If you need refills please call your pharmacy. Do not use My Chart to request refills or for acute issues that need immediate attention.  A few tips:  -As we age balance is not as good as it was, so there is a higher risks for falls. Please remove small rugs and furniture that is "in your way" and could increase the risk of falls. Stretching exercises may help with fall prevention: Yoga and Tai Chi are some examples. Low impact exercise is better, so you are not very achy the next day.  -Sun screen and avoidance of direct sun light recommended. Caution with dehydration, if working outdoors be sure to drink enough fluids.  - Some medications are not safe as we age, increases the risk of side effects and can potentially interact with other medication you are also taken;  including some of over the counter medications. Be sure to let me know when you start a new medication even if it is a dietary/vitamin supplement.   -Healthy diet low in red meet/animal fat and sugar + regular physical activity is recommended.     Please be sure medication list is accurate. If a new problem present, please set up appointment sooner than planned today.  If we have ordered labs or studies at this visit, it can take up to 1-2 weeks for results and processing. IF results require follow up or explanation, we will call you with instructions. Clinically stable results will be released to your [facility]. If you have not heard from [place] or cannot find your results in [facility] in  2 weeks please contact our office at [phone].  If you are not yet signed up for [facility], please consider signing up.

## 2019-10-07 ENCOUNTER — Encounter: Payer: Self-pay | Admitting: Family Medicine

## 2019-10-07 ENCOUNTER — Other Ambulatory Visit: Payer: Self-pay

## 2019-10-07 ENCOUNTER — Ambulatory Visit (INDEPENDENT_AMBULATORY_CARE_PROVIDER_SITE_OTHER): Payer: Medicare Other | Admitting: Family Medicine

## 2019-10-07 VITALS — BP 128/70 | HR 75 | Resp 16 | Ht 63.25 in | Wt 147.0 lb

## 2019-10-07 DIAGNOSIS — M2550 Pain in unspecified joint: Secondary | ICD-10-CM

## 2019-10-07 DIAGNOSIS — I10 Essential (primary) hypertension: Secondary | ICD-10-CM | POA: Diagnosis not present

## 2019-10-07 DIAGNOSIS — Z0000 Encounter for general adult medical examination without abnormal findings: Secondary | ICD-10-CM | POA: Diagnosis not present

## 2019-10-07 DIAGNOSIS — E1169 Type 2 diabetes mellitus with other specified complication: Secondary | ICD-10-CM | POA: Diagnosis not present

## 2019-10-07 DIAGNOSIS — E785 Hyperlipidemia, unspecified: Secondary | ICD-10-CM | POA: Diagnosis not present

## 2019-10-07 DIAGNOSIS — M816 Localized osteoporosis [Lequesne]: Secondary | ICD-10-CM

## 2019-10-07 LAB — POCT GLYCOSYLATED HEMOGLOBIN (HGB A1C): HbA1c, POC (prediabetic range): 5.8 % (ref 5.7–6.4)

## 2019-10-07 MED ORDER — SHINGRIX 50 MCG/0.5ML IM SUSR: INTRAMUSCULAR | 1 refills | Status: DC

## 2019-10-07 NOTE — Progress Notes (Signed)
HPI: Nancy Chandler is a 64 y.o. female, who is here today for her routine physical.  Last CPE: 09/16/18  Regular exercise 3 or more time per week: For the past 3-4 months she has been walking every morning 10,000 steps (2 miles). Following a healthy diet: She has not been consistent. She loves potatoes. She tries to eat fruit daily, salads and vegetables.Potatoes chip,bake if she makes one for somebody. She lives with her husband.  Chronic medical problems: DM II,GERD,anxiety,HTN,and HLD among some.  Pap smear: 2020 LGSIL negative HPV. Follows with gyn,Dr Sabra Heck.  Immunization History  Administered Date(s) Administered   Influenza,inj,Quad PF,6+ Mos 03/14/2017, 02/25/2018, 12/05/2018   Influenza-Unspecified 12/04/2013, 11/21/2015   Moderna SARS-COVID-2 Vaccination 04/12/2019, 05/13/2019   Pneumococcal Polysaccharide-23 03/16/2016   Tdap 12/08/2013   Mammogram: 01/28/19. Colonoscopy: 01/02/2017 DEXA: 09/11/18, osteoporosis. She is not on treatment, Fosamax was recommended.  Hep C screening: 09/2014 NR  She has some concerns today.  Joint stiffness and pain: Hand and hips mainly. Problem has been going on for a while, intermittent. Exacerbated by repetitive manual activities and prolonged walking. She has not noted erythema.  DM II: On non pharmacologic treatment. HgA1C 5.9 in 03/2019.  HTN: She is on HCTZ 12.5 mg daily.  Component     Latest Ref Rng & Units 04/14/2019  Sodium     135 - 146 mmol/L 143  Potassium     3.5 - 5.3 mmol/L 3.7  Chloride     98 - 110 mmol/L 103  CO2     20 - 32 mmol/L 32  Glucose     65 - 99 mg/dL 96  BUN     7 - 25 mg/dL 13  Creatinine     0.50 - 0.99 mg/dL 0.85  Calcium     8.6 - 10.4 mg/dL 9.4  GFR     >60.00 mL/min 81.31   HLD: She is on Atorvastatin 20 mg daily.  Component     Latest Ref Rng & Units 02/25/2018  Cholesterol     <200 mg/dL 163  Triglycerides     <150 mg/dL 59.0  HDL Cholesterol     > OR = 50  mg/dL 54.80  VLDL     0.0 - 40.0 mg/dL 11.8  LDL (calc)     0 - 99 mg/dL 97  Total CHOL/HDL Ratio     <5.0 (calc) 3  NonHDL      108.62   Review of Systems  Constitutional: Negative for appetite change, fatigue and fever.  HENT: Negative for dental problem, hearing loss, mouth sores, sore throat, trouble swallowing and voice change.   Eyes: Negative for redness and visual disturbance.  Respiratory: Negative for cough, shortness of breath and wheezing.   Cardiovascular: Negative for chest pain and leg swelling.  Gastrointestinal: Negative for abdominal pain, nausea and vomiting.       No changes in bowel habits.  Endocrine: Negative for cold intolerance, heat intolerance, polydipsia, polyphagia and polyuria.  Genitourinary: Negative for decreased urine volume, dysuria, hematuria, vaginal bleeding and vaginal discharge.  Musculoskeletal: Positive for arthralgias and back pain. Negative for myalgias.  Skin: Negative for color change and rash.  Allergic/Immunologic: Negative for environmental allergies.  Neurological: Negative for syncope, weakness and headaches.  Hematological: Negative for adenopathy. Does not bruise/bleed easily.  Psychiatric/Behavioral: Negative for confusion and sleep disturbance. The patient is not nervous/anxious.   All other systems reviewed and are negative.  Current Outpatient Medications on File Prior to Visit  Medication Sig Dispense Refill   atorvastatin (LIPITOR) 20 MG tablet Take 1 tablet (20 mg total) by mouth daily. 90 tablet 3   Cholecalciferol (VITAMIN D) 50 MCG (2000 UT) CAPS Take by mouth daily.     clobetasol ointment (TEMOVATE) 0.05 % Apply thinly to affected area one daily for no longer than one week if symptoms have stopped. 60 g 0   EPINEPHrine (EPIPEN 2-PAK) 0.3 mg/0.3 mL IJ SOAJ injection Inject 0.3 mLs (0.3 mg total) into the muscle as needed for anaphylaxis. 1 each 1   fluticasone (FLONASE) 50 MCG/ACT nasal spray Place into the nose as  needed.     hydrochlorothiazide (MICROZIDE) 12.5 MG capsule TAKE 1 CAPSULE BY MOUTH EVERY DAY 90 capsule 1   ibuprofen (ADVIL,MOTRIN) 200 MG tablet Take by mouth.     Multiple Vitamin (MULTIVITAMIN) tablet Take 1 tablet by mouth daily.     No current facility-administered medications on file prior to visit.   Past Medical History:  Diagnosis Date   Abnormal Pap smear of cervix    neg HPV HR+ 16/18 neg 2017, 2018 LGSIL HPV-, 2019 ASCUS HPV HR+, 08-21-2018 LGSIL HPV HR-   Anxiety    Depression    Diabetes mellitus without complication ([employer])    Dyspareunia    History of colon polyps    Hyperlipidemia    Hypertension    Lichen sclerosus    Thyroid disease     Past Surgical History:  Procedure Laterality Date   COLPOSCOPY  2010, 2018, 2019   History of LSIL, ASCUS + HPV   LYMPH NODE BIOPSY     TUBAL LIGATION  1990    Allergies  Allergen Reactions   Hydrocodone Nausea And Vomiting    Family History  Problem Relation Age of Onset   Hypertension Brother    Hearing loss Brother    Hyperlipidemia Brother    Cancer Mother        colon   Hypertension Father    Alcohol abuse Father    Diabetes Father    Heart disease Maternal Grandmother        CHF   Arthritis Maternal Grandmother    Cancer Maternal Grandfather        kidney   Heart disease Paternal Grandmother        CHF   Birth defects Daughter    Breast cancer Paternal Aunt    Heart disease Paternal Aunt        open heart surgery    Social History   Socioeconomic History   Marital status: Married    Spouse name: Not on file   Number of children: 2   Years of education: Not on file   Highest education level: Not on file  Occupational History   Not on file  Tobacco Use   Smoking status: Never Smoker   Smokeless tobacco: Never Used  Scientific laboratory technician Use: Never used  Substance and Sexual Activity   Alcohol use: No    Alcohol/week: 0.0 standard drinks   Drug use:  No   Sexual activity: Yes    Partners: Male    Birth control/protection: Surgical    Comment: Tubal ligation  Other Topics Concern   Not on file  Social History Narrative   Not on file   Social Determinants of Health   Financial Resource Strain:    Difficulty of Paying Living Expenses: Not on file  Food Insecurity:    Worried About Charity fundraiser in  the Last Year: Not on file   Ran Out of Food in the Last Year: Not on file  Transportation Needs:    Lack of Transportation (Medical): Not on file   Lack of Transportation (Non-Medical): Not on file  Physical Activity:    Days of Exercise per Week: Not on file   Minutes of Exercise per Session: Not on file  Stress:    Feeling of Stress : Not on file  Social Connections:    Frequency of Communication with Friends and Family: Not on file   Frequency of Social Gatherings with Friends and Family: Not on file   Attends Religious Services: Not on file   Active Member of Clubs or Organizations: Not on file   Attends Archivist Meetings: Not on file   Marital Status: Not on file    Vitals:   10/07/19 0725  BP: 128/70  Pulse: 75  Resp: 16  SpO2: 98%   Body mass index is 25.83 kg/m.  Wt Readings from Last 3 Encounters:  10/07/19 147 lb (66.7 kg)  06/02/19 144 lb (65.3 kg)  04/30/19 144 lb 6.4 oz (65.5 kg)   Physical Exam Vitals and nursing note reviewed.  Constitutional:      General: She is not in acute distress.    Appearance: She is well-developed.  HENT:     Head: Normocephalic and atraumatic.     Right Ear: Hearing, tympanic membrane, ear canal and external ear normal.     Left Ear: Hearing, tympanic membrane, ear canal and external ear normal.     Mouth/Throat:     Pharynx: Uvula midline.  Eyes:     Extraocular Movements: Extraocular movements intact.     Conjunctiva/sclera: Conjunctivae normal.     Pupils: Pupils are equal, round, and reactive to light.  Neck:     Thyroid: No  thyromegaly.     Trachea: No tracheal deviation.  Cardiovascular:     Rate and Rhythm: Normal rate and regular rhythm.     Pulses:          Dorsalis pedis pulses are 2+ on the right side and 2+ on the left side.     Heart sounds: No murmur heard.   Pulmonary:     Effort: Pulmonary effort is normal. No respiratory distress.     Breath sounds: Normal breath sounds.  Abdominal:     Palpations: Abdomen is soft. There is no hepatomegaly or mass.     Tenderness: There is no abdominal tenderness.  Genitourinary:    Comments: Deferred to gyn. Musculoskeletal:     Comments: No signs of synovitis appreciated.  Lymphadenopathy:     Cervical: No cervical adenopathy.     Upper Body:     Right upper body: No supraclavicular adenopathy.     Left upper body: No supraclavicular adenopathy.  Skin:    General: Skin is warm.     Findings: No erythema or rash.  Neurological:     General: No focal deficit present.     Mental Status: She is alert and oriented to person, place, and time.     Cranial Nerves: No cranial nerve deficit.     Coordination: Coordination normal.     Gait: Gait normal.     Deep Tendon Reflexes:     Reflex Scores:      Bicep reflexes are 2+ on the right side and 2+ on the left side.      Patellar reflexes are 2+ on the right side  and 2+ on the left side. Psychiatric:        Mood and Affect: Affect normal. Mood is anxious.     Comments: Well groomed, good eye contact.   ASSESSMENT AND PLAN:  Ms. Alyxandria Wentz was here today annual physical examination.  Orders Placed This Encounter  Procedures   COMPLETE METABOLIC PANEL WITH GFR   Microalbumin / creatinine urine ratio   Lipid panel   POC HgB A1c   Lab Results  Component Value Date   HGBA1C 5.8 10/07/2019   Lab Results  Component Value Date   CHOL 183 10/07/2019   HDL 61 10/07/2019   LDLCALC 106 (H) 10/07/2019   TRIG 72 10/07/2019   CHOLHDL 3.0 10/07/2019    Lab Results  Component Value Date     ALT 12 10/07/2019   AST 15 10/07/2019   ALKPHOS 74 02/25/2018   BILITOT 0.6 10/07/2019   Lab Results  Component Value Date   CREATININE 0.92 10/07/2019   BUN 13 10/07/2019   NA 142 10/07/2019   K 4.4 10/07/2019   CL 106 10/07/2019   CO2 33 (H) 10/07/2019    Routine general medical examination at a health care facility We discussed the importance of regular physical activity and healthy diet for prevention of chronic illness and/or complications. Preventive guidelines reviewed. Vaccination up to date.  Ca++ and vit D supplementation recommended. Next CPE in a year.  -     Zoster Vaccine Adjuvanted [facility]) injection; 0.5 ml in muscle and repeat in 8 weeks  Type 2 diabetes mellitus with other specified complication, without long-term current use of insulin ([place]) HgA1C has been at goal. Continue non pharmacologic treatment. Annual eye exam, periodic dental and foot care recommended. F/U in 5-6 months  Essential hypertension BP adequately controlled. No changes in current management.  Hyperlipidemia, unspecified hyperlipidemia type Continue Atorvastatin 20 mg daily.  Arthralgia, unspecified joint Most likely OA. Educated about Dx. Tylenol arthritis 2-3 times per day is a good option for pain if needed.  Localized osteoporosis without current pathological fracture Fall precautions. Fosamax was recommended.Continue Ca++ and Vit D supplementation.  Return in about 4 months (around 02/06/2020) for welcome to Medicare visit.Marland Kitchen   Amariana Mirando G. [place], MD  [facility]. [place] office.  A few things to remember from today's visit:   Type 2 diabetes mellitus with other specified complication, without long-term current use of insulin ([place]) - Plan: POC HgB A1c, COMPLETE METABOLIC PANEL WITH GFR, Microalbumin / creatinine urine ratio  Essential hypertension - Plan: COMPLETE METABOLIC PANEL WITH GFR  Hyperlipidemia, unspecified hyperlipidemia type - Plan: Lipid  panel  Routine general medical examination at a health care facility  If you need refills please call your pharmacy. Do not use My Chart to request refills or for acute issues that need immediate attention.  A few tips:  -As we age balance is not as good as it was, so there is a higher risks for falls. Please remove small rugs and furniture that is "in your way" and could increase the risk of falls. Stretching exercises may help with fall prevention: Yoga and Tai Chi are some examples. Low impact exercise is better, so you are not very achy the next day.  -Sun screen and avoidance of direct sun light recommended. Caution with dehydration, if working outdoors be sure to drink enough fluids.  - Some medications are not safe as we age, increases the risk of side effects and can potentially interact with other medication  you are also taken;  including some of over the counter medications. Be sure to let me know when you start a new medication even if it is a dietary/vitamin supplement.  -Healthy diet low in red meet/animal fat and sugar + regular physical activity is recommended.    Please be sure medication list is accurate. If a new problem present, please set up appointment sooner than planned today.  If we have ordered labs or studies at this visit, it can take up to 1-2 weeks for results and processing. IF results require follow up or explanation, we will call you with instructions. Clinically stable results will be released to your [facility]. If you have not heard from [place] or cannot find your results in [facility] in 2 weeks please contact our office at [phone].  If you are not yet signed up for [facility], please consider signing up.

## 2019-10-08 LAB — LIPID PANEL
Cholesterol: 183 mg/dL (ref <200)
HDL: 61 mg/dL (ref >=50)
LDL Cholesterol (Calc): 106 mg/dL (calc) — ABNORMAL HIGH
Non-HDL Cholesterol (Calc): 122 mg/dL (calc) (ref <130)
Total CHOL/HDL Ratio: 3 (calc) (ref <5.0)
Triglycerides: 72 mg/dL (ref <150)

## 2019-10-08 LAB — COMPLETE METABOLIC PANEL WITH GFR
AG Ratio: 1.2 (calc) (ref 1.0–2.5)
ALT: 12 U/L (ref 6–29)
AST: 15 U/L (ref 10–35)
Albumin: 3.6 g/dL (ref 3.6–5.1)
Alkaline phosphatase (APISO): 75 U/L (ref 37–153)
BUN: 13 mg/dL (ref 7–25)
CO2: 33 mmol/L — ABNORMAL HIGH (ref 20–32)
Calcium: 9.2 mg/dL (ref 8.6–10.4)
Chloride: 106 mmol/L (ref 98–110)
Creat: 0.92 mg/dL (ref 0.50–0.99)
GFR, Est African American: 76 mL/min/{1.73_m2} (ref >=60)
GFR, Est Non African American: 65 mL/min/{1.73_m2} (ref >=60)
Globulin: 3 g/dL (calc) (ref 1.9–3.7)
Glucose, Bld: 107 mg/dL — ABNORMAL HIGH (ref 65–99)
Potassium: 4.4 mmol/L (ref 3.5–5.3)
Sodium: 142 mmol/L (ref 135–146)
Total Bilirubin: 0.6 mg/dL (ref 0.2–1.2)
Total Protein: 6.6 g/dL (ref 6.1–8.1)

## 2019-10-08 LAB — MICROALBUMIN / CREATININE URINE RATIO
Creatinine, Urine: 214 mg/dL (ref 20–275)
Microalb Creat Ratio: 2 mcg/mg creat (ref <30)
Microalb, Ur: 0.4 mg/dL

## 2019-10-15 ENCOUNTER — Other Ambulatory Visit: Payer: Self-pay

## 2019-10-15 MED ORDER — ALENDRONATE SODIUM 70 MG PO TABS: 70.0000 mg | ORAL_TABLET | ORAL | 2 refills | Status: DC

## 2019-10-26 ENCOUNTER — Other Ambulatory Visit: Payer: Self-pay | Admitting: Family Medicine

## 2019-10-26 DIAGNOSIS — I10 Essential (primary) hypertension: Secondary | ICD-10-CM

## 2019-11-09 DIAGNOSIS — Z20822 Contact with and (suspected) exposure to covid-19: Secondary | ICD-10-CM | POA: Diagnosis not present

## 2019-11-10 ENCOUNTER — Encounter: Payer: Self-pay | Admitting: Family Medicine

## 2019-11-13 NOTE — Telephone Encounter (Signed)
Pt called and stated that she needed to have the pills for today and she is aware that the provider is not in the office and stated never mind b/c tomorrow will be too late b/c she will be flying out in the morning.

## 2019-11-20 DIAGNOSIS — Z20822 Contact with and (suspected) exposure to covid-19: Secondary | ICD-10-CM | POA: Diagnosis not present

## 2019-12-25 DIAGNOSIS — Z20822 Contact with and (suspected) exposure to covid-19: Secondary | ICD-10-CM | POA: Diagnosis not present

## 2020-01-05 ENCOUNTER — Other Ambulatory Visit: Payer: Self-pay | Admitting: Family Medicine

## 2020-01-12 ENCOUNTER — Other Ambulatory Visit: Payer: Self-pay

## 2020-01-12 ENCOUNTER — Encounter: Payer: Self-pay | Admitting: Family Medicine

## 2020-01-12 ENCOUNTER — Ambulatory Visit (INDEPENDENT_AMBULATORY_CARE_PROVIDER_SITE_OTHER): Payer: Medicare Other | Admitting: Family Medicine

## 2020-01-12 VITALS — BP 140/80 | HR 72 | Temp 98.7°F | Resp 16 | Ht 63.25 in | Wt 146.0 lb

## 2020-01-12 DIAGNOSIS — J302 Other seasonal allergic rhinitis: Secondary | ICD-10-CM

## 2020-01-12 DIAGNOSIS — R1310 Dysphagia, unspecified: Secondary | ICD-10-CM | POA: Diagnosis not present

## 2020-01-12 DIAGNOSIS — M722 Plantar fascial fibromatosis: Secondary | ICD-10-CM

## 2020-01-12 MED ORDER — OMEPRAZOLE 40 MG PO CPDR: 40.0000 mg | DELAYED_RELEASE_CAPSULE | Freq: Every day | ORAL | 1 refills | Status: DC

## 2020-01-12 NOTE — Patient Instructions (Addendum)
A few things to remember from today's visit:   Plantar fasciitis  Seasonal allergic rhinitis, unspecified trigger  Dysphagia, unspecified type  If difficulty swallowing is not better in 2-3 weeks we will need to arrange for swallowing study. For nasal congestion Flonase nasal spray at bedtime, away from septum. Nasal saline irrigations.  Plantar Fasciitis  Plantar fasciitis is a painful foot condition that affects the heel. It occurs when the band of tissue that connects the toes to the heel bone (plantar fascia) becomes irritated. This can happen as the result of exercising too much or doing other repetitive activities (overuse injury). The pain from plantar fasciitis can range from mild irritation to severe pain that makes it difficult to walk or move. The pain is usually worse in the morning after sleeping, or after sitting or lying down for a while. Pain may also be worse after long periods of walking or standing. What are the causes? This condition may be caused by:  Standing for long periods of time.  Wearing shoes that do not have good arch support.  Doing activities that put stress on joints (high-impact activities), including running, aerobics, and ballet.  Being overweight.  An abnormal way of walking (gait).  Tight muscles in the back of your lower leg (calf).  High arches in your feet.  Starting a new athletic activity. What are the signs or symptoms? The main symptom of this condition is heel pain. Pain may:  Be worse with first steps after a time of rest, especially in the morning after sleeping or after you have been sitting or lying down for a while.  Be worse after long periods of standing still.  Decrease after 30-45 minutes of activity, such as gentle walking. How is this diagnosed? This condition may be diagnosed based on your medical history and your symptoms. Your health care provider may ask questions about your activity level. Your health care  provider will do a physical exam to check for:  A tender area on the bottom of your foot.  A high arch in your foot.  Pain when you move your foot.  Difficulty moving your foot. You may have imaging tests to confirm the diagnosis, such as:  X-rays.  Ultrasound.  MRI. How is this treated? Treatment for plantar fasciitis depends on how severe your condition is. Treatment may include:  Rest, ice, applying pressure (compression), and raising the affected foot (elevation). This may be called RICE therapy. Your health care provider may recommend RICE therapy along with over-the-counter pain medicines to manage your pain.  Exercises to stretch your calves and your plantar fascia.  A splint that holds your foot in a stretched, upward position while you sleep (night splint).  Physical therapy to relieve symptoms and prevent problems in the future.  Injections of steroid medicine (cortisone) to relieve pain and inflammation.  Stimulating your plantar fascia with electrical impulses (extracorporeal shock wave therapy). This is usually the last treatment option before surgery.  Surgery, if other treatments have not worked after 12 months. Follow these instructions at home:  Managing pain, stiffness, and swelling  If directed, put ice on the painful area: ? Put ice in a plastic bag, or use a frozen bottle of water. ? Place a towel between your skin and the bag or bottle. ? Roll the bottom of your foot over the bag or bottle. ? Do this for 20 minutes, 2-3 times a day.  Wear athletic shoes that have air-sole or gel-sole cushions, or try  wearing soft shoe inserts that are designed for plantar fasciitis.  Raise (elevate) your foot above the level of your heart while you are sitting or lying down. Activity  Avoid activities that cause pain. Ask your health care provider what activities are safe for you.  Do physical therapy exercises and stretches as told by your health care  provider.  Try activities and forms of exercise that are easier on your joints (low-impact). Examples include swimming, water aerobics, and biking. General instructions  Take over-the-counter and prescription medicines only as told by your health care provider.  Wear a night splint while sleeping, if told by your health care provider. Loosen the splint if your toes tingle, become numb, or turn cold and blue.  Maintain a healthy weight, or work with your health care provider to lose weight as needed.  Keep all follow-up visits as told by your health care provider. This is important. Contact a health care provider if you:  Have symptoms that do not go away after caring for yourself at home.  Have pain that gets worse.  Have pain that affects your ability to move or do your daily activities. Summary  Plantar fasciitis is a painful foot condition that affects the heel. It occurs when the band of tissue that connects the toes to the heel bone (plantar fascia) becomes irritated.  The main symptom of this condition is heel pain that may be worse after exercising too much or standing still for a long time.  Treatment varies, but it usually starts with rest, ice, compression, and elevation (RICE therapy) and over-the-counter medicines to manage pain. This information is not intended to replace advice given to you by your health care provider. Make sure you discuss any questions you have with your health care provider. Document Revised: 01/19/2017 Document Reviewed: 12/04/2016 Elsevier Patient Education  2020 [employer].  If you need refills please call your pharmacy. Do not use My Chart to request refills or for acute issues that need immediate attention.    Please be sure medication list is accurate. If a new problem present, please set up appointment sooner than planned today.

## 2020-01-12 NOTE — Progress Notes (Signed)
Chief Complaint  Patient presents with  . Otalgia   HPI: [name] is a 64 y.o. female, who is here today with above complaint.   Right earache 6 days ago, has not has it for 2 days. Fulness sensation. 2 days ago woke up with nasal congestion, worse with certain position in bed. Negative for fever,chills,hearing changes,or ear drainage.  She has other concerns today.  Difficulty swallowing: Noted when she was eating a hamburger 2 weeks ago. Also happened when she was eating pop corn. She ate pop corn again and did not have problem. She has no problem with liquids. No Hx of tobacco use.  No CP,sore throat,heartburn,N/V,or abdominal pain.  Right heel pain that started last week when she got up in the morning. Alleviated by rest and exacerbated when prolonged walking, lately she has been walking "a lot." Throbbing and soreness moderate pain.  She has had some pain in left heel but milder. Today pain "is not as bad." She has pain intermittently through the day. Took Ibuprofen x 1. She has also tried different shoes.  No hx of trauma.  She has not noted burning,tingling,numbness,edema,or erythema.  Review of Systems  Constitutional: Negative for activity change, appetite change and fatigue.  HENT: Negative for mouth sores and nosebleeds.   Eyes: Negative for discharge and itching.  Respiratory: Negative for cough, shortness of breath and wheezing.   Cardiovascular: Negative for chest pain and palpitations.  Gastrointestinal:       Negative for changes in bowel habits.  Genitourinary: Negative for decreased urine volume.  Musculoskeletal: Positive for arthralgias.  Neurological: Negative for syncope, weakness and headaches.  Rest see pertinent positives and negatives per HPI.  Current Outpatient Medications on File Prior to Visit  Medication Sig Dispense Refill  . alendronate (FOSAMAX) 70 MG tablet TAKE 1 TABLET (70 MG TOTAL) BY MOUTH ONCE A WEEK. TAKE  WITH A FULL GLASS OF WATER ON AN EMPTY STOMACH. 12 tablet 2  . atorvastatin (LIPITOR) 20 MG tablet Take 1 tablet (20 mg total) by mouth daily. 90 tablet 3  . Cholecalciferol (VITAMIN D) 50 MCG (2000 UT) CAPS Take by mouth daily.    . clobetasol ointment (TEMOVATE) 0.05 % Apply thinly to affected area one daily for no longer than one week if symptoms have stopped. 60 g 0  . EPINEPHrine (EPIPEN 2-PAK) 0.3 mg/0.3 mL IJ SOAJ injection Inject 0.3 mLs (0.3 mg total) into the muscle as needed for anaphylaxis. 1 each 1  . fluticasone (FLONASE) 50 MCG/ACT nasal spray Place into the nose as needed.    . hydrochlorothiazide (MICROZIDE) 12.5 MG capsule TAKE 1 CAPSULE BY MOUTH EVERY DAY 90 capsule 1  . ibuprofen (ADVIL,MOTRIN) 200 MG tablet Take by mouth.    . Multiple Vitamin (MULTIVITAMIN) tablet Take 1 tablet by mouth daily.    Marland Kitchen Zoster Vaccine Adjuvanted [facility]) injection 0.5 ml in muscle and repeat in 8 weeks 0.5 mL 1   No current facility-administered medications on file prior to visit.     Past Medical History:  Diagnosis Date  . Abnormal Pap smear of cervix    neg HPV HR+ 16/18 neg 2017, 2018 LGSIL HPV-, 2019 ASCUS HPV HR+, 08-21-2018 LGSIL HPV HR-  . Anxiety   . Depression   . Diabetes mellitus without complication ([place])   . Dyspareunia   . History of colon polyps   . Hyperlipidemia   . Hypertension   . Lichen sclerosus   . Thyroid disease  Allergies  Allergen Reactions  . Hydrocodone Nausea And Vomiting    Social History   Socioeconomic History  . Marital status: Married    Spouse name: Not on file  . Number of children: 2  . Years of education: Not on file  . Highest education level: Not on file  Occupational History  . Not on file  Tobacco Use  . Smoking status: Never Smoker  . Smokeless tobacco: Never Used  Vaping Use  . Vaping Use: Never used  Substance and Sexual Activity  . Alcohol use: No    Alcohol/week: 0.0 standard drinks  . Drug use: No  . Sexual  activity: Yes    Partners: Male    Birth control/protection: Surgical    Comment: Tubal ligation  Other Topics Concern  . Not on file  Social History Narrative  . Not on file   Social Determinants of Health   Financial Resource Strain:   . Difficulty of Paying Living Expenses: Not on file  Food Insecurity:   . Worried About Charity fundraiser in the Last Year: Not on file  . Ran Out of Food in the Last Year: Not on file  Transportation Needs:   . Lack of Transportation (Medical): Not on file  . Lack of Transportation (Non-Medical): Not on file  Physical Activity:   . Days of Exercise per Week: Not on file  . Minutes of Exercise per Session: Not on file  Stress:   . Feeling of Stress : Not on file  Social Connections:   . Frequency of Communication with Friends and Family: Not on file  . Frequency of Social Gatherings with Friends and Family: Not on file  . Attends Religious Services: Not on file  . Active Member of Clubs or Organizations: Not on file  . Attends Archivist Meetings: Not on file  . Marital Status: Not on file    Vitals:   01/12/20 0829  BP: 140/80  Pulse: 72  Resp: 16  Temp: 98.7 F (37.1 C)  SpO2: 98%   Body mass index is 25.66 kg/m.  Physical Exam Vitals and nursing note reviewed.  Constitutional:      General: She is not in acute distress.    Appearance: She is well-developed. She is not ill-appearing.  HENT:     Head: Normocephalic and atraumatic.     Right Ear: Tympanic membrane, ear canal and external ear normal.     Left Ear: Tympanic membrane, ear canal and external ear normal.     Nose: Septal deviation present. No mucosal edema or rhinorrhea.     Right Turbinates: Enlarged.     Left Turbinates: Enlarged.     Mouth/Throat:     Mouth: Mucous membranes are moist.     Pharynx: Oropharynx is clear.  Eyes:     Conjunctiva/sclera: Conjunctivae normal.  Cardiovascular:     Rate and Rhythm: Normal rate and regular rhythm.      Heart sounds: No murmur heard.   Pulmonary:     Effort: Pulmonary effort is normal. No respiratory distress.     Breath sounds: Normal breath sounds. No stridor.  Musculoskeletal:     Cervical back: No edema or erythema. No muscular tenderness.     Comments: Bilateral foot:Tenderness upon palpation of heel mainly at the medial insertion of plantar fascia into calcaneous. Also mild discomfort with palpation along planta fascia towards forefoot.  Dorsal flexion of first MTP does not elicits pain.  No edema or erythema  appreciated on area.  Lymphadenopathy:     Head:     Right side of head: No submandibular adenopathy.     Left side of head: No submandibular adenopathy.     Cervical: No cervical adenopathy.  Skin:    General: Skin is warm.     Findings: No erythema or rash.  Neurological:     Mental Status: She is alert and oriented to person, place, and time.  Psychiatric:     Comments: Well groomed, good eye contact.    ASSESSMENT AND PLAN:  [name] was seen today for otalgia.  Diagnoses and all orders for this visit:  Dysphagia, unspecified type We dicussed possible etiologies. ? GERD. She prefers to hold on swallowing study for now,which is appropriate. She agrees with trying PPI, Omeprazole. GERD precautions. If not any better in 2-3 weeks,we will consider studies and/or GI valuation.  Plantar fasciitis Educated about Dx,prognosis,and treatment options. Stretching exercises and night splints may help. Shoes inserts. If not better,podiatric evaluation may be necessary.  Seasonal allergic rhinitis, unspecified trigger Nasal saline irrigations. She has Flonase at home, recommended at bedtime.  Other orders -     omeprazole (PRILOSEC) 40 MG capsule; Take 1 capsule (40 mg total) by mouth daily before breakfast.   Return if symptoms worsen or fail to improve, for Keep next f/u appt.   Rylie Limburg G. [place], MD  [facility]. [place] office.    A few  things to remember from today's visit:   Plantar fasciitis  Seasonal allergic rhinitis, unspecified trigger  Dysphagia, unspecified type  If difficulty swallowing is not better in 2-3 weeks we will need to arrange for swallowing study. For nasal congestion Flonase nasal spray at bedtime, away from septum. Nasal saline irrigations.  Plantar Fasciitis  Plantar fasciitis is a painful foot condition that affects the heel. It occurs when the band of tissue that connects the toes to the heel bone (plantar fascia) becomes irritated. This can happen as the result of exercising too much or doing other repetitive activities (overuse injury). The pain from plantar fasciitis can range from mild irritation to severe pain that makes it difficult to walk or move. The pain is usually worse in the morning after sleeping, or after sitting or lying down for a while. Pain may also be worse after long periods of walking or standing. What are the causes? This condition may be caused by:  Standing for long periods of time.  Wearing shoes that do not have good arch support.  Doing activities that put stress on joints (high-impact activities), including running, aerobics, and ballet.  Being overweight.  An abnormal way of walking (gait).  Tight muscles in the back of your lower leg (calf).  High arches in your feet.  Starting a new athletic activity. What are the signs or symptoms? The main symptom of this condition is heel pain. Pain may:  Be worse with first steps after a time of rest, especially in the morning after sleeping or after you have been sitting or lying down for a while.  Be worse after long periods of standing still.  Decrease after 30-45 minutes of activity, such as gentle walking. How is this diagnosed? This condition may be diagnosed based on your medical history and your symptoms. Your health care provider may ask questions about your activity level. Your health care provider  will do a physical exam to check for:  A tender area on the bottom of your foot.  A high arch  in your foot.  Pain when you move your foot.  Difficulty moving your foot. You may have imaging tests to confirm the diagnosis, such as:  X-rays.  Ultrasound.  MRI. How is this treated? Treatment for plantar fasciitis depends on how severe your condition is. Treatment may include:  Rest, ice, applying pressure (compression), and raising the affected foot (elevation). This may be called RICE therapy. Your health care provider may recommend RICE therapy along with over-the-counter pain medicines to manage your pain.  Exercises to stretch your calves and your plantar fascia.  A splint that holds your foot in a stretched, upward position while you sleep (night splint).  Physical therapy to relieve symptoms and prevent problems in the future.  Injections of steroid medicine (cortisone) to relieve pain and inflammation.  Stimulating your plantar fascia with electrical impulses (extracorporeal shock wave therapy). This is usually the last treatment option before surgery.  Surgery, if other treatments have not worked after 12 months. Follow these instructions at home:  Managing pain, stiffness, and swelling  If directed, put ice on the painful area: ? Put ice in a plastic bag, or use a frozen bottle of water. ? Place a towel between your skin and the bag or bottle. ? Roll the bottom of your foot over the bag or bottle. ? Do this for 20 minutes, 2-3 times a day.  Wear athletic shoes that have air-sole or gel-sole cushions, or try wearing soft shoe inserts that are designed for plantar fasciitis.  Raise (elevate) your foot above the level of your heart while you are sitting or lying down. Activity  Avoid activities that cause pain. Ask your health care provider what activities are safe for you.  Do physical therapy exercises and stretches as told by your health care provider.  Try  activities and forms of exercise that are easier on your joints (low-impact). Examples include swimming, water aerobics, and biking. General instructions  Take over-the-counter and prescription medicines only as told by your health care provider.  Wear a night splint while sleeping, if told by your health care provider. Loosen the splint if your toes tingle, become numb, or turn cold and blue.  Maintain a healthy weight, or work with your health care provider to lose weight as needed.  Keep all follow-up visits as told by your health care provider. This is important. Contact a health care provider if you:  Have symptoms that do not go away after caring for yourself at home.  Have pain that gets worse.  Have pain that affects your ability to move or do your daily activities. Summary  Plantar fasciitis is a painful foot condition that affects the heel. It occurs when the band of tissue that connects the toes to the heel bone (plantar fascia) becomes irritated.  The main symptom of this condition is heel pain that may be worse after exercising too much or standing still for a long time.  Treatment varies, but it usually starts with rest, ice, compression, and elevation (RICE therapy) and over-the-counter medicines to manage pain. This information is not intended to replace advice given to you by your health care provider. Make sure you discuss any questions you have with your health care provider. Document Revised: 01/19/2017 Document Reviewed: 12/04/2016 Elsevier Patient Education  2020 [employer].  If you need refills please call your pharmacy. Do not use My Chart to request refills or for acute issues that need immediate attention.    Please be sure medication list is  accurate. If a new problem present, please set up appointment sooner than planned today.

## 2020-02-01 ENCOUNTER — Encounter: Payer: Self-pay | Admitting: Family Medicine

## 2020-02-02 MED ORDER — ATORVASTATIN CALCIUM 20 MG PO TABS
20.0000 mg | ORAL_TABLET | Freq: Every day | ORAL | 3 refills | Status: DC
Start: 2020-02-02 — End: 2020-06-21

## 2020-02-03 ENCOUNTER — Encounter: Payer: Self-pay | Admitting: Family Medicine

## 2020-02-03 DIAGNOSIS — Z1231 Encounter for screening mammogram for malignant neoplasm of breast: Secondary | ICD-10-CM | POA: Diagnosis not present

## 2020-02-10 ENCOUNTER — Other Ambulatory Visit: Payer: Self-pay | Admitting: Family Medicine

## 2020-02-13 DIAGNOSIS — Z20822 Contact with and (suspected) exposure to covid-19: Secondary | ICD-10-CM | POA: Diagnosis not present

## 2020-02-13 DIAGNOSIS — Z03818 Encounter for observation for suspected exposure to other biological agents ruled out: Secondary | ICD-10-CM | POA: Diagnosis not present

## 2020-03-04 ENCOUNTER — Other Ambulatory Visit: Payer: Self-pay | Admitting: Family Medicine

## 2020-03-22 ENCOUNTER — Ambulatory Visit: Payer: Medicare Other | Admitting: Podiatry

## 2020-03-22 ENCOUNTER — Ambulatory Visit (INDEPENDENT_AMBULATORY_CARE_PROVIDER_SITE_OTHER): Payer: Medicare Other

## 2020-03-22 ENCOUNTER — Encounter: Payer: Self-pay | Admitting: Podiatry

## 2020-03-22 ENCOUNTER — Other Ambulatory Visit: Payer: Self-pay

## 2020-03-22 VITALS — BP 125/81 | HR 73 | Temp 98.3°F

## 2020-03-22 DIAGNOSIS — M722 Plantar fascial fibromatosis: Secondary | ICD-10-CM

## 2020-03-22 NOTE — Progress Notes (Signed)
Subjective:   Patient ID: Nancy Chandler, female   DOB: 65 y.o.   MRN: [id number]   HPI Patient states she has intense discomfort plantar aspect right heel states is been very sore and making walking difficult.  Patient states that she tried new insoles and ice without relief and it is affecting her ability to be active.  Patient does not smoke likes to be active   Review of Systems  All other systems reviewed and are negative.       Objective:  Physical Exam Vitals and nursing note reviewed.  Constitutional:      Appearance: She is well-developed and well-nourished.  Cardiovascular:     Pulses: Intact distal pulses.  Pulmonary:     Effort: Pulmonary effort is normal.  Musculoskeletal:        General: Normal range of motion.  Skin:    General: Skin is warm.  Neurological:     Mental Status: She is alert.     Neurovascular status intact muscle strength was found to be adequate range of motion adequate.  Patient is found to have exquisite discomfort plantar aspect right heel insertional point tendon calcaneus inflammation fluid with moderate depression of the arch noted also.  Patient is found to have good digital perfusion well oriented x3     Assessment:  Acute plantar fasciitis right inflammation fluid buildup with structural changes of the arch contributory     Plan:  H&P condition reviewed.  Today I did sterile prep I injected the plantar fascia 3 mg Dexasone Kenalog 5 mg Xylocaine I advised on anti-inflammatories physical therapy and dispensed a fascial brace.  Discussed topical medicine she can use and activities that would be appropriate and reappoint to recheck  X-rays indicate that there is minimal plantar spur with small posterior spur noted of the right heel

## 2020-03-22 NOTE — Patient Instructions (Signed)

## 2020-04-02 ENCOUNTER — Other Ambulatory Visit: Payer: Self-pay | Admitting: Family Medicine

## 2020-04-07 ENCOUNTER — Encounter: Payer: Self-pay | Admitting: Podiatry

## 2020-04-07 ENCOUNTER — Other Ambulatory Visit: Payer: Self-pay

## 2020-04-07 ENCOUNTER — Ambulatory Visit: Payer: Medicare Other | Admitting: Podiatry

## 2020-04-07 DIAGNOSIS — M722 Plantar fascial fibromatosis: Secondary | ICD-10-CM | POA: Diagnosis not present

## 2020-04-07 NOTE — Progress Notes (Signed)
Subjective:   Patient ID: Nancy Chandler, female   DOB: 65 y.o.   MRN: [id number]   HPI Patient states she is improved and very happy with how her heel feels   ROS      Objective:  Physical Exam  Neurovascular status intact with significant diminishment of discomfort plantar aspect right heel at the insertional tendon to the bone     Assessment:  Improvement plantar fasciitis with only mild symptoms present     Plan:  Discussed continued support shoe gear usage stretching exercises ice modified activity and brace usage.  Reappoint as symptoms indicate

## 2020-04-18 ENCOUNTER — Other Ambulatory Visit: Payer: Self-pay | Admitting: Family Medicine

## 2020-04-18 DIAGNOSIS — I10 Essential (primary) hypertension: Secondary | ICD-10-CM

## 2020-04-21 ENCOUNTER — Other Ambulatory Visit: Payer: Self-pay | Admitting: Obstetrics & Gynecology

## 2020-04-21 DIAGNOSIS — L900 Lichen sclerosus et atrophicus: Secondary | ICD-10-CM

## 2020-04-23 ENCOUNTER — Other Ambulatory Visit: Payer: Self-pay

## 2020-04-23 ENCOUNTER — Ambulatory Visit (HOSPITAL_COMMUNITY)
Admission: EM | Admit: 2020-04-23 | Discharge: 2020-04-23 | Disposition: A | Discharge status: Home / Self Care | Payer: Medicare Other | Attending: Emergency Medicine | Admitting: Emergency Medicine

## 2020-04-23 ENCOUNTER — Encounter (HOSPITAL_COMMUNITY): Payer: Self-pay

## 2020-04-23 ENCOUNTER — Ambulatory Visit (HOSPITAL_COMMUNITY)
Admission: EM | Admit: 2020-04-23 | Discharge: 2020-04-23 | Discharge status: Against Medical Advice | Payer: Medicare Other

## 2020-04-23 DIAGNOSIS — S46912A Strain of unspecified muscle, fascia and tendon at shoulder and upper arm level, left arm, initial encounter: Secondary | ICD-10-CM

## 2020-04-23 DIAGNOSIS — M25512 Pain in left shoulder: Secondary | ICD-10-CM | POA: Diagnosis not present

## 2020-04-23 DIAGNOSIS — M62838 Other muscle spasm: Secondary | ICD-10-CM

## 2020-04-23 MED ORDER — TIZANIDINE HCL 4 MG PO TABS: 4.0000 mg | ORAL_TABLET | Freq: Three times a day (TID) | ORAL | 0 refills | Status: AC | PRN

## 2020-04-23 MED ORDER — IBUPROFEN 600 MG PO TABS: 600.0000 mg | ORAL_TABLET | Freq: Four times a day (QID) | ORAL | 0 refills | Status: AC | PRN
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
## 2020-04-23 NOTE — Discharge Instructions (Signed)
Rest and drink lots of fluids.   You may fell more sore over the next few days.    Take the Zanaflex as needed for muscle spasms.  It might make you sleepy so don't take it while driving or with alcohol.  Take the Ibuprofen as needed for pain.    Use heat and ice.  You can ice for 10-15 minutes 4 times a day.
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
## 2020-04-30 ENCOUNTER — Other Ambulatory Visit: Payer: Self-pay

## 2020-04-30 ENCOUNTER — Ambulatory Visit: Payer: Medicare Other | Admitting: Podiatry

## 2020-04-30 ENCOUNTER — Encounter: Payer: Self-pay | Admitting: Podiatry

## 2020-04-30 DIAGNOSIS — B351 Tinea unguium: Secondary | ICD-10-CM

## 2020-04-30 DIAGNOSIS — L600 Ingrowing nail: Secondary | ICD-10-CM | POA: Diagnosis not present

## 2020-04-30 DIAGNOSIS — M722 Plantar fascial fibromatosis: Secondary | ICD-10-CM | POA: Diagnosis not present

## 2020-04-30 NOTE — Patient Instructions (Signed)

## 2020-05-02 NOTE — Progress Notes (Signed)
Subjective:   Patient ID: Nancy Chandler, female   DOB: 65 y.o.   MRN: [id number]   HPI Patient presents concerned about discomfort which is occurred in the left big toenail over the last month and also continues to have mild to moderate changes in her plantar fascia but improved from previous treatments   ROS      Objective:  Physical Exam  Neurovascular status intact with inflammation of the dorsal nail left that occurs within the bed itself and moderately increased on the lateral side.  Patient has no drainage or proximal edema erythema or drainage noted and does have diminishment of discomfort in the plantar heel right with discomfort still upon deep palpation but improved     Assessment:  Damaged left hallux nail with the possibility there could be some low-grade underlying infection with acute plantar fasciitis improvement     Plan:  H&P reviewed all conditions discussed nail at great length.  At this time will get a try soaks and cushioning and I did discuss possible removal antibiotics or other treatments and that there may be underlying infection even though its not apparent currently.  For the heel I did recommend continued physical therapy supportive shoe gear usage ice therapy

## 2020-06-07 ENCOUNTER — Other Ambulatory Visit: Payer: Self-pay

## 2020-06-07 ENCOUNTER — Encounter (HOSPITAL_BASED_OUTPATIENT_CLINIC_OR_DEPARTMENT_OTHER): Payer: Self-pay | Admitting: Obstetrics & Gynecology

## 2020-06-07 ENCOUNTER — Ambulatory Visit (INDEPENDENT_AMBULATORY_CARE_PROVIDER_SITE_OTHER): Payer: Medicare Other | Admitting: Obstetrics & Gynecology

## 2020-06-07 ENCOUNTER — Other Ambulatory Visit (HOSPITAL_COMMUNITY)
Admission: RE | Admit: 2020-06-07 | Discharge: 2020-06-07 | Disposition: A | Discharge status: Home / Self Care | Payer: Medicare Other | Source: Ambulatory Visit | Attending: Obstetrics & Gynecology | Admitting: Obstetrics & Gynecology

## 2020-06-07 VITALS — BP 150/67 | HR 69 | Resp 18 | Ht 62.5 in | Wt 143.2 lb

## 2020-06-07 DIAGNOSIS — N904 Leukoplakia of vulva: Secondary | ICD-10-CM | POA: Diagnosis not present

## 2020-06-07 DIAGNOSIS — M816 Localized osteoporosis [Lequesne]: Secondary | ICD-10-CM

## 2020-06-07 DIAGNOSIS — Z124 Encounter for screening for malignant neoplasm of cervix: Secondary | ICD-10-CM | POA: Diagnosis present

## 2020-06-07 DIAGNOSIS — Z9889 Other specified postprocedural states: Secondary | ICD-10-CM | POA: Diagnosis not present

## 2020-06-07 DIAGNOSIS — N870 Mild cervical dysplasia: Secondary | ICD-10-CM

## 2020-06-07 DIAGNOSIS — Z1151 Encounter for screening for human papillomavirus (HPV): Secondary | ICD-10-CM | POA: Diagnosis not present

## 2020-06-07 DIAGNOSIS — Z780 Asymptomatic menopausal state: Secondary | ICD-10-CM | POA: Diagnosis not present

## 2020-06-07 DIAGNOSIS — Z9189 Other specified personal risk factors, not elsewhere classified: Secondary | ICD-10-CM

## 2020-06-07 DIAGNOSIS — Z01419 Encounter for gynecological examination (general) (routine) without abnormal findings: Secondary | ICD-10-CM | POA: Diagnosis not present

## 2020-06-07 MED ORDER — MOMETASONE FUROATE 0.1 % EX OINT: TOPICAL_OINTMENT | CUTANEOUS | 1 refills | Status: DC
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
## 2020-06-09 LAB — CYTOLOGY - PAP
Adequacy: ABSENT
Comment: NEGATIVE
Diagnosis: NEGATIVE
High risk HPV: NEGATIVE

## 2020-06-11 ENCOUNTER — Telehealth (HOSPITAL_BASED_OUTPATIENT_CLINIC_OR_DEPARTMENT_OTHER): Payer: Self-pay | Admitting: *Deleted

## 2020-06-11 NOTE — Telephone Encounter (Signed)
DOB verified. Pt informed of negative pap and hpv so there is not a need to do a PAP for 3 years.  Pt informed that she would still need to have her yearly gyn exam for monitoring of lichen sclerosus. Pt verbalized understanding.

## 2020-06-21 ENCOUNTER — Other Ambulatory Visit: Payer: Self-pay

## 2020-06-21 DIAGNOSIS — I10 Essential (primary) hypertension: Secondary | ICD-10-CM

## 2020-06-21 MED ORDER — OMEPRAZOLE 40 MG PO CPDR: DELAYED_RELEASE_CAPSULE | ORAL | 3 refills | Status: DC

## 2020-06-21 MED ORDER — ATORVASTATIN CALCIUM 20 MG PO TABS: 20.0000 mg | ORAL_TABLET | Freq: Every day | ORAL | 3 refills | Status: DC

## 2020-06-21 MED ORDER — HYDROCHLOROTHIAZIDE 12.5 MG PO CAPS: 12.5000 mg | ORAL_CAPSULE | Freq: Every day | ORAL | 3 refills | Status: DC

## 2020-07-02 ENCOUNTER — Other Ambulatory Visit: Payer: Self-pay | Admitting: Family Medicine

## 2020-07-12 ENCOUNTER — Encounter (HOSPITAL_BASED_OUTPATIENT_CLINIC_OR_DEPARTMENT_OTHER): Payer: Self-pay

## 2020-07-12 ENCOUNTER — Encounter: Payer: Self-pay | Admitting: Family Medicine

## 2020-07-12 DIAGNOSIS — Z91030 Bee allergy status: Secondary | ICD-10-CM

## 2020-07-12 MED ORDER — EPINEPHRINE 0.3 MG/0.3ML IJ SOAJ: 0.3000 mg | INTRAMUSCULAR | 1 refills | Status: DC | PRN

## 2020-07-13 ENCOUNTER — Other Ambulatory Visit (HOSPITAL_BASED_OUTPATIENT_CLINIC_OR_DEPARTMENT_OTHER): Payer: Self-pay | Admitting: Obstetrics & Gynecology

## 2020-07-13 DIAGNOSIS — L900 Lichen sclerosus et atrophicus: Secondary | ICD-10-CM

## 2020-07-13 MED ORDER — CLOBETASOL PROPIONATE 0.05 % EX OINT: TOPICAL_OINTMENT | CUTANEOUS | 0 refills | Status: DC

## 2020-07-27 NOTE — Telephone Encounter (Signed)
Called patient and verified that she did get the clobetasol. Patient states that she does have the medication. tbw

## 2020-07-30 ENCOUNTER — Encounter: Payer: Self-pay | Admitting: Family Medicine

## 2020-07-30 ENCOUNTER — Other Ambulatory Visit: Payer: Self-pay | Admitting: Family Medicine

## 2020-07-30 DIAGNOSIS — F40243 Fear of flying: Secondary | ICD-10-CM

## 2020-07-30 MED ORDER — HYDROXYZINE HCL 25 MG PO TABS: ORAL_TABLET | ORAL | 0 refills | Status: AC

## 2020-08-30 ENCOUNTER — Other Ambulatory Visit: Payer: Self-pay

## 2020-08-30 ENCOUNTER — Ambulatory Visit (INDEPENDENT_AMBULATORY_CARE_PROVIDER_SITE_OTHER): Payer: Medicare Other

## 2020-08-30 VITALS — BP 124/68 | HR 68 | Temp 98.0°F | Ht 63.0 in | Wt 146.0 lb

## 2020-08-30 DIAGNOSIS — Z0000 Encounter for general adult medical examination without abnormal findings: Secondary | ICD-10-CM

## 2020-08-30 NOTE — Progress Notes (Signed)
Subjective:   Nancy Chandler is a 65 y.o. female who presents for an Initial Medicare Annual Wellness Visit.  Review of Systems    N/a       Objective:    There were no vitals filed for this visit. There is no height or weight on file to calculate BMI.  Advanced Directives 03/16/2016 02/08/2016 11/02/2015  Does Patient Have a Medical Advance Directive? No Yes Yes  Type of Advance Directive - [place] -  Does patient want to make changes to medical advance directive? - - Yes - information given  Copy of [place] in Chart? - No - copy requested No - copy requested  Would patient like information on creating a medical advance directive? Yes (Inpatient - patient requests chaplain consult to create a medical advance directive) - -    Current Medications (verified) Outpatient Encounter Medications as of 08/30/2020  Medication Sig   atorvastatin (LIPITOR) 20 MG tablet Take 1 tablet (20 mg total) by mouth daily.   clobetasol ointment (TEMOVATE) 0.05 % Apply bid for itching for up to 5 days.  Call office if itching is not relieved after that time.   EPINEPHrine (EPIPEN 2-PAK) 0.3 mg/0.3 mL IJ SOAJ injection Inject 0.3 mg into the muscle as needed for anaphylaxis.   fluticasone (FLONASE) 50 MCG/ACT nasal spray Place into the nose as needed.   hydrochlorothiazide (MICROZIDE) 12.5 MG capsule Take 1 capsule (12.5 mg total) by mouth daily.   ibuprofen (ADVIL) 600 MG tablet Take 1 tablet (600 mg total) by mouth every 6 (six) hours as needed.   mometasone (ELOCON) 0.1 % ointment Apply topically twice weekly   Multiple Vitamin (MULTIVITAMIN) tablet Take 1 tablet by mouth daily.   omeprazole (PRILOSEC) 40 MG capsule TAKE 1 CAPSULE BY MOUTH EVERY DAY BEFORE BREAKFAST   No facility-administered encounter medications on file as of 08/30/2020.    Allergies (verified) Hydrocodone   History: Past Medical History:  Diagnosis Date   Abnormal Pap smear  of cervix    neg HPV HR+ 16/18 neg 2017, 2018 LGSIL HPV-, 2019 ASCUS HPV HR+, 08-21-2018 LGSIL HPV HR-   Anxiety    Depression    Diabetes mellitus without complication ([place])    Dyspareunia    History of colon polyps    Hyperlipidemia    Hypertension    Lichen sclerosus    Thyroid disease    Past Surgical History:  Procedure Laterality Date   COLPOSCOPY  2010, 2018, 2019   History of LSIL, ASCUS + HPV   LYMPH NODE BIOPSY     TUBAL LIGATION  1990   Family History  Problem Relation Age of Onset   Hypertension Brother    Hearing loss Brother    Hyperlipidemia Brother    Cancer Mother        colon   Hypertension Father    Alcohol abuse Father    Diabetes Father    Heart disease Maternal Grandmother        CHF   Arthritis Maternal Grandmother    Cancer Maternal Grandfather        kidney   Heart disease Paternal Grandmother        CHF   Birth defects Daughter    Breast cancer Paternal Aunt    Heart disease Paternal Aunt        open heart surgery   Social History   Socioeconomic History   Marital status: Married    Spouse name:  Not on file   Number of children: 2   Years of education: Not on file   Highest education level: Not on file  Occupational History   Not on file  Tobacco Use   Smoking status: Never   Smokeless tobacco: Never  Vaping Use   Vaping Use: Never used  Substance and Sexual Activity   Alcohol use: No    Alcohol/week: 0.0 standard drinks   Drug use: No   Sexual activity: Yes    Partners: Male    Birth control/protection: Surgical    Comment: Tubal ligation  Other Topics Concern   Not on file  Social History Narrative   Not on file   Social Determinants of Health   Financial Resource Strain: Not on file  Food Insecurity: Not on file  Transportation Needs: Not on file  Physical Activity: Not on file  Stress: Not on file  Social Connections: Not on file    Tobacco Counseling Counseling given: Not Answered   Clinical Intake:                  Diabetic?yes Nutrition Risk Assessment:  Has the patient had any N/V/D within the last 2 months?  No  Does the patient have any non-healing wounds?  No  Has the patient had any unintentional weight loss or weight gain?  No   Diabetes:  Is the patient diabetic?  Yes  If diabetic, was a CBG obtained today?  No  Did the patient bring in their glucometer from home?  No  How often do you monitor your CBG's? never.   Financial Strains and Diabetes Management:  Are you having any financial strains with the device, your supplies or your medication? No .  Does the patient want to be seen by Chronic Care Management for management of their diabetes?  No  Would the patient like to be referred to a Nutritionist or for Diabetic Management?  No   Diabetic Exams:  Diabetic Eye Exam: Completed 08/2019 Diabetic Foot Exam: Overdue, Pt has been advised about the importance in completing this exam. Pt is scheduled for diabetic foot exam on next office .          Activities of Daily Living In your present state of health, do you have any difficulty performing the following activities: 10/07/2019  Hearing? N  Vision? N  Difficulty concentrating or making decisions? N  Walking or climbing stairs? N  Dressing or bathing? N  Doing errands, shopping? N  Some recent data might be hidden    Patient Care Team: Martinique, Betty G, MD as PCP - General (Family Medicine) Buford Dresser, MD as PCP - Cardiology (Cardiology)  Indicate any recent Medical Services you may have received from other than [facility] providers in the past year (date may be approximate).     Assessment:   This is a routine wellness examination for Nancy Chandler.  Hearing/Vision screen No results found.  Dietary issues and exercise activities discussed:     Goals Addressed   None    Depression Screen PHQ 2/9 Scores 06/07/2020 02/25/2018 08/03/2015 09/28/2014 08/18/2014  PHQ - 2 Score 0 0 0 0 0    Fall  Risk Fall Risk  08/03/2015 09/28/2014  Falls in the past year? Yes No  Number falls in past yr: 1 -  Injury with Fall? Yes -    FALL RISK PREVENTION PERTAINING TO THE HOME:  Any stairs in or around the home? No  If so, are there any without  handrails? Yes  Home free of loose throw rugs in walkways, pet beds, electrical cords, etc? Yes  Adequate lighting in your home to reduce risk of falls? Yes   ASSISTIVE DEVICES UTILIZED TO PREVENT FALLS:  Life alert? No  Use of a cane, walker or w/c? No  Grab bars in the bathroom? No  Shower chair or bench in shower? No  Elevated toilet seat or a handicapped toilet? No   TIMED UP AND GO:  Was the test performed? Yes .  Length of time to ambulate 10 feet: 6 sec.   Gait steady and fast without use of assistive device  Cognitive Function:        Immunizations Immunization History  Administered Date(s) Administered   Influenza,inj,Quad PF,6+ Mos 03/14/2017, 02/25/2018, 12/05/2018   Influenza-Unspecified 12/04/2013, 11/21/2015   Moderna Sars-Covid-2 Vaccination 04/12/2019, 05/13/2019, 12/30/2019, 07/27/2020   Pneumococcal Polysaccharide-23 03/16/2016   Tdap 12/08/2013   Zoster Recombinat (Shingrix) 10/20/2019, 07/27/2020    TDAP status: Up to date  Flu Vaccine status: Up to date   Pneumococcal vaccine status: Up to date  Covid-19 vaccine status: Completed vaccines  Qualifies for Shingles Vaccine? Yes   Zostavax completed No   Shingrix Completed?: Yes  Screening Tests Health Maintenance  Topic Date Due   OPHTHALMOLOGY EXAM  12/05/2019   FOOT EXAM  04/06/2020   HEMOGLOBIN A1C  04/08/2020   URINE MICROALBUMIN  10/06/2020   PNA vac Low Risk Adult (1 of 2 - PCV13) 03/16/2021 (Originally 08/23/2019)   INFLUENZA VACCINE  09/20/2020   COLONOSCOPY (Pts 45-34yrs Insurance coverage will need to be confirmed)  02/20/2021   MAMMOGRAM  02/02/2022   TETANUS/TDAP  12/09/2023   DEXA SCAN  Completed   COVID-19 Vaccine  Completed    Hepatitis C Screening  Completed   Zoster Vaccines- Shingrix  Completed   HPV VACCINES  Aged Out    Health Maintenance  Health Maintenance Due  Topic Date Due   OPHTHALMOLOGY EXAM  12/05/2019   FOOT EXAM  04/06/2020   HEMOGLOBIN A1C  04/08/2020   URINE MICROALBUMIN  10/06/2020    Colorectal cancer screening: Type of screening: Colonoscopy. Completed 02/21/2011. Repeat every 10 years  Mammogram status: Completed 02/03/2020. Repeat every year  Bone Density status: Completed 09/11/2018. Results reflect: Bone density results: OSTEOPENIA. Repeat every 5 years.  Lung Cancer Screening: (Low Dose CT Chest recommended if Age 71-80 years, 30 pack-year currently smoking OR have quit w/in 15years.) does not qualify.   Lung Cancer Screening Referral: n/a  Additional Screening:  Hepatitis C Screening: does not qualify; Completed 09/28/2014  Vision Screening: Recommended annual ophthalmology exams for early detection of glaucoma and other disorders of the eye. Is the patient up to date with their annual eye exam?  Yes  Who is the provider or what is the name of the office in which the patient attends annual eye exams? [facility]  If pt is not established with a provider, would they like to be referred to a provider to establish care? No .   Dental Screening: Recommended annual dental exams for proper oral hygiene  Community Resource Referral / Chronic Care Management: CRR required this visit?  No   CCM required this visit?  No      Plan:     I have personally reviewed and noted the following in the patient's chart:   Medical and social history Use of alcohol, tobacco or illicit drugs  Current medications and supplements including opioid prescriptions. Patient is not currently  taking opioid prescriptions. Functional ability and status Nutritional status Physical activity Advanced directives List of other physicians Hospitalizations, surgeries, and ER visits in previous 12  months Vitals Screenings to include cognitive, depression, and falls Referrals and appointments  In addition, I have reviewed and discussed with patient certain preventive protocols, quality metrics, and best practice recommendations. A written personalized care plan for preventive services as well as general preventive health recommendations were provided to patient.     Randel Pigg, LPN   10/18/8335   Nurse Notes: none

## 2020-08-30 NOTE — Patient Instructions (Signed)
Nancy Chandler , Thank you for taking time to come for your Medicare Wellness Visit. I appreciate your ongoing commitment to your health goals. Please review the following plan we discussed and let me know if I can assist you in the future.   Screening recommendations/referrals: Colonoscopy: 02/21/2011  due 2023 Mammogram: 02/03/2020 Bone Density: 09/11/2018 Recommended yearly ophthalmology/optometry visit for glaucoma screening and checkup Recommended yearly dental visit for hygiene and checkup  Vaccinations: Influenza vaccine: due in fall 65 2022 Pneumococcal vaccine: completed series  Tdap vaccine: 12/08/2013 Shingles vaccine: completed series   Advanced directives: will provide copies   Conditions/risks identified: none   Next appointment: 10/27/2020 @730  am  Dr.Jordan     Preventive Care 66 Years and Older, Female Preventive care refers to lifestyle choices and visits with your health care provider that can promote health and wellness. What does preventive care include? A yearly physical exam. This is also called an annual well check. Dental exams once or twice a year. Routine eye exams. Ask your health care provider how often you should have your eyes checked. Personal lifestyle choices, including: Daily care of your teeth and gums. Regular physical activity. Eating a healthy diet. Avoiding tobacco and drug use. Limiting alcohol use. Practicing safe sex. Taking low-dose aspirin every day. Taking vitamin and mineral supplements as recommended by your health care provider. What happens during an annual well check? The services and screenings done by your health care provider during your annual well check will depend on your age, overall health, lifestyle risk factors, and family history of disease. Counseling  Your health care provider may ask you questions about your: Alcohol use. Tobacco use. Drug use. Emotional well-being. Home and relationship well-being. Sexual  activity. Eating habits. History of falls. Memory and ability to understand (cognition). Work and work Statistician. Reproductive health. Screening  You may have the following tests or measurements: Height, weight, and BMI. Blood pressure. Lipid and cholesterol levels. These may be checked every 5 years, or more frequently if you are over 72 years old. Skin check. Lung cancer screening. You may have this screening every year starting at age 51 if you have a 30-pack-year history of smoking and currently smoke or have quit within the past 15 years. Fecal occult blood test (FOBT) of the stool. You may have this test every year starting at age 66. Flexible sigmoidoscopy or colonoscopy. You may have a sigmoidoscopy every 5 years or a colonoscopy every 10 years starting at age 59. Hepatitis C blood test. Hepatitis B blood test. Sexually transmitted disease (STD) testing. Diabetes screening. This is done by checking your blood sugar (glucose) after you have not eaten for a while (fasting). You may have this done every 1-3 years. Bone density scan. This is done to screen for osteoporosis. You may have this done starting at age 12. Mammogram. This may be done every 1-2 years. Talk to your health care provider about how often you should have regular mammograms. Talk with your health care provider about your test results, treatment options, and if necessary, the need for more tests. Vaccines  Your health care provider may recommend certain vaccines, such as: Influenza vaccine. This is recommended every year. Tetanus, diphtheria, and acellular pertussis (Tdap, Td) vaccine. You may need a Td booster every 10 years. Zoster vaccine. You may need this after age 53. Pneumococcal 13-valent conjugate (PCV13) vaccine. One dose is recommended after age 45. Pneumococcal polysaccharide (PPSV23) vaccine. One dose is recommended after age 65. Talk to your health care  provider about which screenings and vaccines  you need and how often you need them. This information is not intended to replace advice given to you by your health care provider. Make sure you discuss any questions you have with your health care provider. Document Released: 03/05/2015 Document Revised: 10/27/2015 Document Reviewed: 12/08/2014 Elsevier Interactive Patient Education  2017 [place] Prevention in the Home Falls can cause injuries. They can happen to people of all ages. There are many things you can do to make your home safe and to help prevent falls. What can I do on the outside of my home? Regularly fix the edges of walkways and driveways and fix any cracks. Remove anything that might make you trip as you walk through a door, such as a raised step or threshold. Trim any bushes or trees on the path to your home. Use bright outdoor lighting. Clear any walking paths of anything that might make someone trip, such as rocks or tools. Regularly check to see if handrails are loose or broken. Make sure that both sides of any steps have handrails. Any raised decks and porches should have guardrails on the edges. Have any leaves, snow, or ice cleared regularly. Use sand or salt on walking paths during winter. Clean up any spills in your garage right away. This includes oil or grease spills. What can I do in the bathroom? Use night lights. Install grab bars by the toilet and in the tub and shower. Do not use towel bars as grab bars. Use non-skid mats or decals in the tub or shower. If you need to sit down in the shower, use a plastic, non-slip stool. Keep the floor dry. Clean up any water that spills on the floor as soon as it happens. Remove soap buildup in the tub or shower regularly. Attach bath mats securely with double-sided non-slip rug tape. Do not have throw rugs and other things on the floor that can make you trip. What can I do in the bedroom? Use night lights. Make sure that you have a light by your bed that  is easy to reach. Do not use any sheets or blankets that are too big for your bed. They should not hang down onto the floor. Have a firm chair that has side arms. You can use this for support while you get dressed. Do not have throw rugs and other things on the floor that can make you trip. What can I do in the kitchen? Clean up any spills right away. Avoid walking on wet floors. Keep items that you use a lot in easy-to-reach places. If you need to reach something above you, use a strong step stool that has a grab bar. Keep electrical cords out of the way. Do not use floor polish or wax that makes floors slippery. If you must use wax, use non-skid floor wax. Do not have throw rugs and other things on the floor that can make you trip. What can I do with my stairs? Do not leave any items on the stairs. Make sure that there are handrails on both sides of the stairs and use them. Fix handrails that are broken or loose. Make sure that handrails are as long as the stairways. Check any carpeting to make sure that it is firmly attached to the stairs. Fix any carpet that is loose or worn. Avoid having throw rugs at the top or bottom of the stairs. If you do have throw rugs, attach them to the  floor with carpet tape. Make sure that you have a light switch at the top of the stairs and the bottom of the stairs. If you do not have them, ask someone to add them for you. What else can I do to help prevent falls? Wear shoes that: Do not have high heels. Have rubber bottoms. Are comfortable and fit you well. Are closed at the toe. Do not wear sandals. If you use a stepladder: Make sure that it is fully opened. Do not climb a closed stepladder. Make sure that both sides of the stepladder are locked into place. Ask someone to hold it for you, if possible. Clearly mark and make sure that you can see: Any grab bars or handrails. First and last steps. Where the edge of each step is. Use tools that help you  move around (mobility aids) if they are needed. These include: Canes. Walkers. Scooters. Crutches. Turn on the lights when you go into a dark area. Replace any light bulbs as soon as they burn out. Set up your furniture so you have a clear path. Avoid moving your furniture around. If any of your floors are uneven, fix them. If there are any pets around you, be aware of where they are. Review your medicines with your doctor. Some medicines can make you feel dizzy. This can increase your chance of falling. Ask your doctor what other things that you can do to help prevent falls. This information is not intended to replace advice given to you by your health care provider. Make sure you discuss any questions you have with your health care provider. Document Released: 12/03/2008 Document Revised: 07/15/2015 Document Reviewed: 03/13/2014 Elsevier Interactive Patient Education  2017 [employer].

## 2020-09-15 ENCOUNTER — Other Ambulatory Visit: Payer: Self-pay | Admitting: Family Medicine

## 2020-10-11 DIAGNOSIS — Z20822 Contact with and (suspected) exposure to covid-19: Secondary | ICD-10-CM | POA: Diagnosis not present

## 2020-10-26 ENCOUNTER — Other Ambulatory Visit: Payer: Self-pay

## 2020-10-26 NOTE — Progress Notes (Signed)
HPI: Nancy Chandler is a 65 y.o. female, who is here today for her routine physical.  Last CPE: 10/07/19.  Regular exercise 3 or more time per week: Walking daily until 3 weeks ago, just came back from vacation. Following a healthy diet: She has not been consistent. She lives with her husband.  Chronic medical problems: DM II,GERD,anxiety,HTN,and HLD among some.  Pap smear: 06/07/20. High risk HPV Negative   Adequacy Satisfactory for evaluation; transformation zone component ABSENT.   Diagnosis - Negative for intraepithelial lesion or malignancy (NILM)   Comment Normal Reference Range HPV - Negative       Follows with gyn,Dr Sabra Heck.  Immunization History  Administered Date(s) Administered   Fluad Quad(high Dose 65+) 10/27/2020   Influenza,inj,Quad PF,6+ Mos 03/14/2017, 02/25/2018, 12/05/2018   Influenza-Unspecified 12/04/2013, 11/21/2015   Moderna Sars-Covid-2 Vaccination 04/12/2019, 05/13/2019, 12/30/2019, 07/27/2020   Pneumococcal Polysaccharide-23 03/16/2016   Tdap 12/08/2013   Zoster Recombinat (Shingrix) 10/20/2019, 07/27/2020   Mammogram: 02/03/20 normal Colonoscopy: January 2013 normal repeat in 10 years.On [facility] there is a colonoscopy on 01/02/17 but I do not have report available. DEXA: 09/11/2018 osteoporosis Hep C screening: 09/28/14 NR  Concerns  and follow up today.  Hypertension:  Medications:HCTZ 12.5 mg daily. BP readings at home:Not checking. Side effects:none  Negative for visual changes, exertional chest pain, dyspnea,  focal weakness, or edema.  Lab Results  Component Value Date   CREATININE 0.92 10/07/2019   BUN 13 10/07/2019   NA 142 10/07/2019   K 4.4 10/07/2019   CL 106 10/07/2019   CO2 33 (H) 10/07/2019   Hyperlipidemia: Currently on Atorvastatin 20 mg daily Side effects from medication:none Lab Results  Component Value Date   CHOL 183 10/07/2019   HDL 61 10/07/2019   LDLCALC 106 (H) 10/07/2019   TRIG 72 10/07/2019    CHOLHDL 3.0 10/07/2019   Diabetes Mellitus II:  - Checking BG at home: Not checking. - Medications: none - eye exam: 09/2020 - foot exam: 04/07/19  - Negative for symptoms of hypoglycemia, polyuria, polydipsia, numbness extremities, foot ulcers/trauma  Lab Results  Component Value Date   HGBA1C 5.8 10/07/2019   Lab Results  Component Value Date   MICROALBUR 0.4 10/07/2019   She follows with podiatrist for plantar fascitis.Requesting handicap sticker to use as needed.   Sometimes right side headache, "it is not often." Last time 2 weeks ago, it hurts "bad", she has had it in the past, no identified pattern. She has not identified exacerbating or alleviating factors. No associated symptoms. Pain last a few seconds. Head MRI done in 02/2016: 1. Normal MRI of the brain. No acute intracranial process identified. 2. Normal intracranial MRA.  Review of Systems  Constitutional:  Negative for activity change, appetite change, fatigue, fever and unexpected weight change.  HENT:  Negative for mouth sores, nosebleeds and trouble swallowing.   Eyes:  Negative for redness and visual disturbance.  Respiratory:  Negative for cough, shortness of breath and wheezing.   Cardiovascular:  Negative for chest pain, palpitations and leg swelling.  Gastrointestinal:  Negative for abdominal pain, nausea and vomiting.       Negative for changes in bowel habits.  Endocrine: Negative for cold intolerance and heat intolerance.  Genitourinary:  Negative for decreased urine volume, dysuria and hematuria.  Musculoskeletal:  Positive for arthralgias and back pain (Occasional). Negative for gait problem.  Allergic/Immunologic: Positive for environmental allergies.  Neurological:  Negative for syncope, facial asymmetry and weakness.  Psychiatric/Behavioral:  Negative for confusion. The patient is nervous/anxious.   All other systems reviewed and are negative.  Current Outpatient Medications on File Prior to  Visit  Medication Sig Dispense Refill   atorvastatin (LIPITOR) 20 MG tablet Take 1 tablet (20 mg total) by mouth daily. 90 tablet 3   clobetasol ointment (TEMOVATE) 0.05 % Apply bid for itching for up to 5 days.  Call office if itching is not relieved after that time. 60 g 0   EPINEPHrine (EPIPEN 2-PAK) 0.3 mg/0.3 mL IJ SOAJ injection Inject 0.3 mg into the muscle as needed for anaphylaxis. 1 each 1   fluticasone (FLONASE) 50 MCG/ACT nasal spray Place into the nose as needed.     hydrochlorothiazide (MICROZIDE) 12.5 MG capsule Take 1 capsule (12.5 mg total) by mouth daily. 90 capsule 3   ibuprofen (ADVIL) 600 MG tablet Take 1 tablet (600 mg total) by mouth every 6 (six) hours as needed. 30 tablet 0   mometasone (ELOCON) 0.1 % ointment Apply topically twice weekly 45 g 1   Multiple Vitamin (MULTIVITAMIN) tablet Take 1 tablet by mouth daily.     omeprazole (PRILOSEC) 40 MG capsule TAKE 1 CAPSULE BY MOUTH EVERY DAY BEFORE BREAKFAST 90 capsule 1   No current facility-administered medications on file prior to visit.   Past Medical History:  Diagnosis Date   Abnormal Pap smear of cervix    neg HPV HR+ 16/18 neg 2017, 2018 LGSIL HPV-, 2019 ASCUS HPV HR+, 08-21-2018 LGSIL HPV HR-   Anxiety    Depression    Diabetes mellitus without complication ([place])    Dyspareunia    History of colon polyps    Hyperlipidemia    Hypertension    Lichen sclerosus    Thyroid disease     Past Surgical History:  Procedure Laterality Date   COLPOSCOPY  2010, 2018, 2019   History of LSIL, ASCUS + HPV   LYMPH NODE BIOPSY     TUBAL LIGATION  1990    Allergies  Allergen Reactions   Bee Venom Anaphylaxis    Carries Epi pen    Hydrocodone Nausea And Vomiting    Family History  Problem Relation Age of Onset   Hypertension Brother    Hearing loss Brother    Hyperlipidemia Brother    Cancer Mother        colon   Hypertension Father    Alcohol abuse Father    Diabetes Father    Heart disease Maternal  Grandmother        CHF   Arthritis Maternal Grandmother    Cancer Maternal Grandfather        kidney   Heart disease Paternal Grandmother        CHF   Birth defects Daughter    Breast cancer Paternal Aunt    Heart disease Paternal Aunt        open heart surgery    Social History   Socioeconomic History   Marital status: Married    Spouse name: Not on file   Number of children: 2   Years of education: Not on file   Highest education level: Not on file  Occupational History   Not on file  Tobacco Use   Smoking status: Never   Smokeless tobacco: Never  Vaping Use   Vaping Use: Never used  Substance and Sexual Activity   Alcohol use: No    Alcohol/week: 0.0 standard drinks   Drug use: No   Sexual activity: Yes  Partners: Male    Birth control/protection: Surgical    Comment: Tubal ligation  Other Topics Concern   Not on file  Social History Narrative   Not on file   Social Determinants of Health   Financial Resource Strain: Low Risk    Difficulty of Paying Living Expenses: Not hard at all  Food Insecurity: No Food Insecurity   Worried About Charity fundraiser in the Last Year: Never true   [place] in the Last Year: Never true  Transportation Needs: No Transportation Needs   Lack of Transportation (Medical): No   Lack of Transportation (Non-Medical): No  Physical Activity: Sufficiently Active   Days of Exercise per Week: 5 days   Minutes of Exercise per Session: 60 min  Stress: No Stress Concern Present   Feeling of Stress : Not at all  Social Connections: Socially Integrated   Frequency of Communication with Friends and Family: Twice a week   Frequency of Social Gatherings with Friends and Family: Twice a week   Attends Religious Services: More than 4 times per year   Active Member of [employer] or Organizations: Yes   Attends Archivist Meetings: More than 4 times per year   Marital Status: Married   Vitals:   10/27/20 0728  BP: 124/70   Pulse: 71  Resp: 16  SpO2: 98%   Body mass index is 25.93 kg/m.  Wt Readings from Last 3 Encounters:  10/27/20 146 lb 6 oz (66.4 kg)  08/30/20 146 lb (66.2 kg)  06/07/20 143 lb 3.2 oz (65 kg)   Physical Exam Vitals and nursing note reviewed.  Constitutional:      General: She is not in acute distress.    Appearance: She is well-developed.  HENT:     Head: Normocephalic and atraumatic.     Right Ear: Hearing, tympanic membrane, ear canal and external ear normal.     Left Ear: Hearing, tympanic membrane, ear canal and external ear normal.     Mouth/Throat:     Mouth: Mucous membranes are moist.     Pharynx: Oropharynx is clear. Uvula midline.  Eyes:     Extraocular Movements: Extraocular movements intact.     Conjunctiva/sclera: Conjunctivae normal.     Pupils: Pupils are equal, round, and reactive to light.  Neck:     Thyroid: No thyromegaly.     Trachea: No tracheal deviation.  Cardiovascular:     Rate and Rhythm: Normal rate and regular rhythm.     Pulses:          Dorsalis pedis pulses are 2+ on the right side and 2+ on the left side.       Posterior tibial pulses are 2+ on the right side and 2+ on the left side.     Heart sounds: No murmur heard. Pulmonary:     Effort: Pulmonary effort is normal. No respiratory distress.     Breath sounds: Normal breath sounds.  Abdominal:     Palpations: Abdomen is soft. There is no hepatomegaly or mass.     Tenderness: There is no abdominal tenderness.  Genitourinary:    Comments: Deferred to gyn. Musculoskeletal:     Comments: No major deformity or signs of synovitis appreciated.  Lymphadenopathy:     Cervical: No cervical adenopathy.     Upper Body:     Right upper body: No supraclavicular adenopathy.     Left upper body: No supraclavicular adenopathy.  Skin:    General:  Skin is warm.     Findings: No erythema or rash.  Neurological:     General: No focal deficit present.     Mental Status: She is alert and oriented  to person, place, and time.     Cranial Nerves: No cranial nerve deficit.     Coordination: Coordination normal.     Gait: Gait normal.     Deep Tendon Reflexes:     Reflex Scores:      Bicep reflexes are 2+ on the right side and 2+ on the left side.      Patellar reflexes are 2+ on the right side and 2+ on the left side. Psychiatric:        Mood and Affect: Mood is anxious.     Comments: Well groomed, good eye contact.   ASSESSMENT AND PLAN:  Ms. Shifra Pricer was here today annual physical examination.  Orders Placed This Encounter  Procedures   Flu Vaccine QUAD High Dose(Fluad)   Hemoglobin A1c   Lipid panel   Comprehensive metabolic panel   Microalbumin/Creatinine Ratio, Urine   Lab Results  Component Value Date   CREATININE 0.84 10/27/2020   BUN 11 10/27/2020   NA 142 10/27/2020   K 3.9 10/27/2020   CL 105 10/27/2020   CO2 31 10/27/2020   Lab Results  Component Value Date   HGBA1C 6.3 10/27/2020   Lab Results  Component Value Date   CHOL 176 10/27/2020   HDL 63.70 10/27/2020   LDLCALC 99 10/27/2020   TRIG 67.0 10/27/2020   CHOLHDL 3 10/27/2020   Lab Results  Component Value Date   ALT 13 10/27/2020   AST 14 10/27/2020   ALKPHOS 74 10/27/2020   BILITOT 0.4 10/27/2020   Lab Results  Component Value Date   MICROALBUR <0.7 10/27/2020   MICROALBUR 0.4 10/07/2019    Routine general medical examination at a health care facility We discussed the importance of regular physical activity and healthy diet for prevention of chronic illness and/or complications.Handicap sticker will be given to use prn. Preventive guidelines reviewed. Continue following with Dr Sabra Heck for her routine female preventive care. Vaccination up to date. Ca++ and vit D supplementation to continue. Next CPE in a year.  The 10-year ASCVD risk score (Arnett DK, et al., 2019) is: 17.8%   Values used to calculate the score:     Age: 31 years     Sex: Female     Is Non-Hispanic  African American: Yes     Diabetic: Yes     Tobacco smoker: No     Systolic Blood Pressure: [id number] mmHg     Is BP treated: Yes     HDL Cholesterol: 63.7 mg/dL     Total Cholesterol: 176 mg/dL  Essential hypertension BP adequately controlled. Continue current management: HCTZ 12.5 mg daily. DASH/low salt diet to continue. Monitor BP at home. Eye exam up to date.  Type 2 diabetes mellitus with other specified complication, without long-term current use of insulin (Secret[place]) HgA1C has been at goal. Continue non pharmacologic treatment. Regular exercise and healthy diet with avoidance of added sugar food intake is an important part of treatment and recommended. Annual eye exam, periodic dental and foot care recommended. F/U in 5-6 months  Hyperlipidemia, unspecified hyperlipidemia type Continue Atorvastatin 20 mg daily and low fat diet. Further recommendations according to lab results.  Headache, unspecified headache type Chronic. We discussed possible etiologies. Hx and examination today do not suggest a serious process. For  now I do not think further work up of treatment is needed. Monitor for new symptoms or worsening problem. Instructed about warning signs.  Need for influenza vaccination -     Flu Vaccine QUAD High Dose(Fluad)  Return in 6 months (on 04/26/2021).  Jaking Thayer G. [place], MD  [facility]. [place] office.

## 2020-10-27 ENCOUNTER — Encounter: Payer: Self-pay | Admitting: Family Medicine

## 2020-10-27 ENCOUNTER — Ambulatory Visit (INDEPENDENT_AMBULATORY_CARE_PROVIDER_SITE_OTHER): Payer: Medicare Other | Admitting: Family Medicine

## 2020-10-27 VITALS — BP 124/70 | HR 71 | Resp 16 | Ht 63.0 in | Wt 146.4 lb

## 2020-10-27 DIAGNOSIS — E785 Hyperlipidemia, unspecified: Secondary | ICD-10-CM | POA: Diagnosis not present

## 2020-10-27 DIAGNOSIS — E1169 Type 2 diabetes mellitus with other specified complication: Secondary | ICD-10-CM | POA: Diagnosis not present

## 2020-10-27 DIAGNOSIS — I10 Essential (primary) hypertension: Secondary | ICD-10-CM

## 2020-10-27 DIAGNOSIS — R519 Headache, unspecified: Secondary | ICD-10-CM

## 2020-10-27 DIAGNOSIS — Z0000 Encounter for general adult medical examination without abnormal findings: Secondary | ICD-10-CM

## 2020-10-27 DIAGNOSIS — Z23 Encounter for immunization: Secondary | ICD-10-CM | POA: Diagnosis not present

## 2020-10-27 LAB — LIPID PANEL
Cholesterol: 176 mg/dL (ref 0–200)
HDL: 63.7 mg/dL (ref >39.00)
LDL Cholesterol: 99 mg/dL (ref 0–99)
NonHDL: 112.05
Total CHOL/HDL Ratio: 3
Triglycerides: 67 mg/dL (ref 0.0–149.0)
VLDL: 13.4 mg/dL (ref 0.0–40.0)

## 2020-10-27 LAB — COMPREHENSIVE METABOLIC PANEL
ALT: 13 U/L (ref 0–35)
AST: 14 U/L (ref 0–37)
Albumin: 3.7 g/dL (ref 3.5–5.2)
Alkaline Phosphatase: 74 U/L (ref 39–117)
BUN: 11 mg/dL (ref 6–23)
CO2: 31 mEq/L (ref 19–32)
Calcium: 9.1 mg/dL (ref 8.4–10.5)
Chloride: 105 mEq/L (ref 96–112)
Creatinine, Ser: 0.84 mg/dL (ref 0.40–1.20)
GFR: 72.54 mL/min (ref >60.00)
Glucose, Bld: 98 mg/dL (ref 70–99)
Potassium: 3.9 mEq/L (ref 3.5–5.1)
Sodium: 142 mEq/L (ref 135–145)
Total Bilirubin: 0.4 mg/dL (ref 0.2–1.2)
Total Protein: 7.2 g/dL (ref 6.0–8.3)

## 2020-10-27 LAB — HEMOGLOBIN A1C: Hgb A1c MFr Bld: 6.3 % (ref 4.6–6.5)

## 2020-10-27 LAB — MICROALBUMIN / CREATININE URINE RATIO
Creatinine,U: 119.1 mg/dL
Microalb Creat Ratio: 0.6 mg/g (ref 0.0–30.0)
Microalb, Ur: <0.7 mg/dL (ref 0.0–1.9)

## 2020-10-27 NOTE — Patient Instructions (Addendum)
Today you have you routine preventive visit. A few things to remember from today's visit:  Routine general medical examination at a health care facility  Essential hypertension - Plan: Comprehensive metabolic panel  Type 2 diabetes mellitus with other specified complication, without long-term current use of insulin ([place]) - Plan: Hemoglobin A1c, Comprehensive metabolic panel, Microalbumin/Creatinine Ratio, Urine  Hyperlipidemia, unspecified hyperlipidemia type - Plan: Lipid panel  Headache, unspecified headache type  Headache features do not suggest a serious process. Continue monitoring for new symptoms.  If you need refills please call your pharmacy. Do not use My Chart to request refills or for acute issues that need immediate attention.   Please be sure medication list is accurate. If a new problem present, please set up appointment sooner than planned today.  At least 150 minutes of moderate exercise per week, daily brisk walking for 15-30 min is a good exercise option. Healthy diet low in saturated (animal) fats and sweets and consisting of fresh fruits and vegetables, lean meats such as fish and white chicken and whole grains.  These are some of recommendations for screening depending of age and risk factors:  - Vaccines:  Tdap vaccine every 10 years.  Shingles vaccine recommended at age 78, could be given after 66 years of age but not sure about insurance coverage.   Pneumonia vaccines: Pneumovax at 64. Sometimes Pneumovax is giving earlier if history of smoking, lung disease,diabetes,kidney disease among some.  Screening for diabetes at age 65 and every 3 years.N/A  Cervical cancer prevention:  Pap smear starts at 66 years of age and continues periodically until 66 years old in low risk women. Pap smear every 3 years between 76 and 38 years old. Pap smear every 3-5 years between women 69 and older if pap smear negative and HPV screening negative.  -Breast cancer:  Mammogram: There is disagreement between experts about when to start screening in low risk asymptomatic female but recent recommendations are to start screening at 80 and not later than 66 years old , every 1-2 years and after 66 yo q 2 years. Screening is recommended until 66 years old but some women can continue screening depending of healthy issues.  Colon cancer screening: Has been recently changed to 66 yo. Insurance may not cover until you are 66 years old. Screening is recommended until 66 years old. Please sign a release form to obtain copy of last colonoscopy.  Cholesterol disorder screening at age 23 and every 3 years.N/A  Also recommended:  Dental visit- Brush and floss your teeth twice daily; visit your dentist twice a year. Eye doctor- Get an eye exam at least every 2 years. Helmet use- Always wear a helmet when riding a bicycle, motorcycle, rollerblading or skateboarding. Safe sex- If you may be exposed to sexually transmitted infections, use a condom. Seat belts- Seat belts can save your live; always wear one. Smoke/Carbon Monoxide detectors- These detectors need to be installed on the appropriate level of your home. Replace batteries at least once a year. Skin cancer- When out in the sun please cover up and use sunscreen 15 SPF or higher. Violence- If anyone is threatening or hurting you, please tell your healthcare provider.  Drink alcohol in moderation- Limit alcohol intake to one drink or less per day. Never drink and drive. Calcium supplementation 1000 to 1200 mg daily, ideally through your diet.  Vitamin D supplementation 800 units daily.

## 2020-10-28 ENCOUNTER — Encounter: Payer: Self-pay | Admitting: Family Medicine

## 2021-01-11 ENCOUNTER — Encounter: Payer: Self-pay | Admitting: Family Medicine

## 2021-01-11 ENCOUNTER — Ambulatory Visit (INDEPENDENT_AMBULATORY_CARE_PROVIDER_SITE_OTHER): Payer: Medicare Other | Admitting: Family Medicine

## 2021-01-11 ENCOUNTER — Ambulatory Visit (INDEPENDENT_AMBULATORY_CARE_PROVIDER_SITE_OTHER): Payer: Medicare Other

## 2021-01-11 ENCOUNTER — Other Ambulatory Visit: Payer: Self-pay

## 2021-01-11 VITALS — BP 124/78 | HR 73 | Temp 98.0°F | Resp 16 | Ht 63.0 in | Wt 147.4 lb

## 2021-01-11 DIAGNOSIS — M25551 Pain in right hip: Secondary | ICD-10-CM | POA: Diagnosis not present

## 2021-01-11 DIAGNOSIS — M25561 Pain in right knee: Secondary | ICD-10-CM | POA: Diagnosis not present

## 2021-01-11 DIAGNOSIS — G8929 Other chronic pain: Secondary | ICD-10-CM

## 2021-01-11 DIAGNOSIS — G4700 Insomnia, unspecified: Secondary | ICD-10-CM | POA: Diagnosis not present

## 2021-01-11 DIAGNOSIS — M545 Low back pain, unspecified: Secondary | ICD-10-CM

## 2021-01-11 DIAGNOSIS — W19XXXA Unspecified fall, initial encounter: Secondary | ICD-10-CM

## 2021-01-11 MED ORDER — TRAZODONE HCL 50 MG PO TABS: 25.0000 mg | ORAL_TABLET | Freq: Every evening | ORAL | 0 refills | Status: DC | PRN

## 2021-01-11 NOTE — Patient Instructions (Addendum)
A few things to remember from today's visit:  Hip pain, right  Chronic pain of right knee - Plan: DG Knee Complete 4 Views Right  Chronic bilateral low back pain without sciatica - Plan: DG Lumbar Spine Complete  Insomnia, unspecified type  If you need refills please call your pharmacy. Do not use My Chart to request refills or for acute issues that need immediate attention.   Hip pain sound like trochanteric bursitis. Knee and lower back could be arthritis. Caution with Ibuprofen. Fall prevention. Trazodone 25 mg 30 min before bedtime for sleep.  Good sleep hygiene to continue.  You are due for colonoscopy 01/02/2022.  Please be sure medication list is accurate. If a new problem present, please set up appointment sooner than planned today. Hip Bursitis Hip bursitis is swelling of one or more fluid-filled sacs (bursae) in your hip joint. This condition can cause pain, and your symptoms may come and go over time. What are the causes? Repeated use of your hip muscles. Injury to the hip. Weak butt muscles. Bone spurs. Infection. In some cases, the cause may not be known. What increases the risk? You are more likely to develop this condition if: You had a past hip injury or hip surgery. You have a condition, such as arthritis, gout, diabetes, or thyroid disease. You have spine problems. You have one leg that is shorter than the other. You run a lot or do long-distance running. You play sports where there is a risk of injury or falling, such as football, martial arts, or skiing. What are the signs or symptoms? Symptoms may come and go, and they often include: Pain in the hip or groin area. Pain may get worse when you move your hip. Tenderness and swelling of the hip. In rare cases, the bursa may become infected. If this happens, you may get a fever, as well as have warmth and redness in the hip area. How is this treated? This condition is treated by: Resting your  hip. Icing your hip. Wrapping the hip area with an elastic bandage (compression wrap). Keeping the hip raised. Other treatments may include medicine, draining fluid out of the bursa, or using crutches, a cane, or a walker. Surgery may be needed, but this is rare. Long-term treatment may include doing exercises to help your strength and flexibility. It may also include lifestyle changes like losing weight to lessen the strain on your hip. Follow these instructions at home: Managing pain, stiffness, and swelling   If told, put ice on the painful area. Put ice in a plastic bag. Place a towel between your skin and the bag. Leave the ice on for 20 minutes, 2-3 times a day. Raise your hip by putting a pillow under your hips while you lie down. Stop if you feel pain. If told, put heat on the affected area. Do this as often as told by your doctor. Use a moist heat pack or a heating pad as told by your doctor. Place a towel between your skin and the heat source. Leave the heat on for 20-30 minutes. Take off the heat if your skin turns bright red. This is very important if you are unable to feel pain, heat, or cold. You may have a greater risk of getting burned. Activity Do not use your hip to support your body weight until your doctor says that you can. Use crutches, a cane, or a walker as told by your doctor. If the affected leg is one that you  use to drive, ask your doctor if it is safe to drive. Rest and protect your hip as much as you can until you feel better. Return to your normal activities as told by your doctor. Ask your doctor what activities are safe for you. Do exercises as told by your doctor. General instructions Take over-the-counter and prescription medicines only as told by your doctor. Gently rub and stretch your injured area as often as is comfortable. Wear elastic bandages only as told by your doctor. If one of your legs is shorter than the other, get fitted for a shoe insert  or orthotic. Keep a healthy weight. Follow instructions from your doctor. Keep all follow-up visits as told by your doctor. This is important. How is this prevented? Exercise regularly, as told by your doctor. Wear the right shoes for the sport you play. Warm up and stretch before being active. Cool down and stretch after being active. Take breaks often from repeated activity. Avoid activities that bother your hip or cause pain. Avoid sitting down for a long time. Where to find more information American Academy of Orthopa[employer] Surgeons: orthoinfo.aaos.org Contact a doctor if: You have a fever. You have new symptoms. You have trouble walking or doing everyday activities. You have pain that gets worse or does not get better with medicine. Your skin around your hip is red. You get a feeling of warmth in your hip area. Get help right away if: You cannot move your hip. You have very bad pain. You cannot control the muscles in your feet. Summary Hip bursitis is swelling of one or more fluid-filled sacs (bursae) in your hip joint. Symptoms often come and go over time. This condition is often treated by resting and icing the hip. It also may help to keep the area raised and wrapped in an elastic bandage. Other treatments may be needed. This information is not intended to replace advice given to you by your health care provider. Make sure you discuss any questions you have with your health care provider. Document Revised: 12/09/2018 Document Reviewed: 10/15/2017 Elsevier Patient Education  2022 [employer].

## 2021-01-11 NOTE — Progress Notes (Signed)
ACUTE VISIT Chief Complaint  Patient presents with   Back Pain    Pain has been going on for several weeks but has gradually been getting worse. Pain radiates from lower back to right hip, down her right leg to her knee.   Hip Pain   HPI: [name] is a 65 y.o. female, who is here today complaining of back and knee pain as described above.  R>L knee pain, that started a couple of weeks ago but has been intermittent for years. Pain is exacerbated by getting up and seeing down. Sometimes it can be 8/10, most of the time it is mild. Negative for edema or erythema. No limitation of ROM.  -Right hip pain for at least 2 weeks.Intermittent for a while. Exacerbated by lying on right side.  Lower back pain sometimes radiated to RLE. It is mild. Negative for saddle anesthesia, numbness,tingling,or bowel/bladder dysfunction. She takes Ibuprofen as needed.  A couple weeks ago she had a fall. She was getting off the car, when it start moving,lost balance and fell on left side. Apparently she left car parked in neutral. She was able to get up without help. She has left shoulder pain for a few days but resolved, no limitation of ROM. She thinks she hit her head, no LOC. Had a headache, similar to headache she has had before.   Mother died from colon cancer at age 40. Inquiring when she is due for her next colonoscopy. Negative for abdominal pain,N/V,blood in stool,melena,or changes in bowel habits.  Insomnia: Chronic. Taking Sleep E naturals every other day but it is not helping. She wakes up early, around 3 Am, and cannot go back to sleep. Melatonin did not help.  Review of Systems  Constitutional:  Negative for activity change, appetite change and fever.  Eyes:  Negative for photophobia and visual disturbance.  Respiratory:  Negative for cough and shortness of breath.   Cardiovascular:  Negative for chest pain and leg swelling.  Endocrine: Negative for cold  intolerance and heat intolerance.  Genitourinary:  Negative for decreased urine volume, dysuria and hematuria.  Musculoskeletal:  Positive for arthralgias. Negative for gait problem.  Skin:  Negative for rash and wound.  Neurological:  Negative for syncope, facial asymmetry and weakness.  Psychiatric/Behavioral:  Positive for sleep disturbance. Negative for confusion. The patient is nervous/anxious.   Rest see pertinent positives and negatives per HPI.  Current Outpatient Medications on File Prior to Visit  Medication Sig Dispense Refill   atorvastatin (LIPITOR) 20 MG tablet Take 1 tablet (20 mg total) by mouth daily. 90 tablet 3   clobetasol ointment (TEMOVATE) 0.05 % Apply bid for itching for up to 5 days.  Call office if itching is not relieved after that time. 60 g 0   EPINEPHrine (EPIPEN 2-PAK) 0.3 mg/0.3 mL IJ SOAJ injection Inject 0.3 mg into the muscle as needed for anaphylaxis. 1 each 1   fluticasone (FLONASE) 50 MCG/ACT nasal spray Place into the nose as needed.     hydrochlorothiazide (MICROZIDE) 12.5 MG capsule Take 1 capsule (12.5 mg total) by mouth daily. 90 capsule 3   ibuprofen (ADVIL) 600 MG tablet Take 1 tablet (600 mg total) by mouth every 6 (six) hours as needed. 30 tablet 0   mometasone (ELOCON) 0.1 % ointment Apply topically twice weekly 45 g 1   Multiple Vitamin (MULTIVITAMIN) tablet Take 1 tablet by mouth daily.     omeprazole (PRILOSEC) 40 MG capsule TAKE 1 CAPSULE BY MOUTH  EVERY DAY BEFORE BREAKFAST 90 capsule 1   No current facility-administered medications on file prior to visit.     Past Medical History:  Diagnosis Date   Abnormal Pap smear of cervix    neg HPV HR+ 16/18 neg 2017, 2018 LGSIL HPV-, 2019 ASCUS HPV HR+, 08-21-2018 LGSIL HPV HR-   Anxiety    Depression    Diabetes mellitus without complication (HCC)    Dyspareunia    History of colon polyps    Hyperlipidemia    Hypertension    Lichen sclerosus    Thyroid disease    Allergies  Allergen  Reactions   Bee Venom Anaphylaxis    Carries Epi pen    Hydrocodone Nausea And Vomiting    Social History   Socioeconomic History   Marital status: Married    Spouse name: Not on file   Number of children: 2   Years of education: Not on file   Highest education level: Not on file  Occupational History   Not on file  Tobacco Use   Smoking status: Never   Smokeless tobacco: Never  Vaping Use   Vaping Use: Never used  Substance and Sexual Activity   Alcohol use: No    Alcohol/week: 0.0 standard drinks   Drug use: No   Sexual activity: Yes    Partners: Male    Birth control/protection: Surgical    Comment: Tubal ligation  Other Topics Concern   Not on file  Social History Narrative   Not on file   Social Determinants of Health   Financial Resource Strain: Low Risk    Difficulty of Paying Living Expenses: Not hard at all  Food Insecurity: No Food Insecurity   Worried About Charity fundraiser in the Last Year: Never true   Ran Out of Food in the Last Year: Never true  Transportation Needs: No Transportation Needs   Lack of Transportation (Medical): No   Lack of Transportation (Non-Medical): No  Physical Activity: Sufficiently Active   Days of Exercise per Week: 5 days   Minutes of Exercise per Session: 60 min  Stress: No Stress Concern Present   Feeling of Stress : Not at all  Social Connections: Socially Integrated   Frequency of Communication with Friends and Family: Twice a week   Frequency of Social Gatherings with Friends and Family: Twice a week   Attends Religious Services: More than 4 times per year   Active Member of [employer] or Organizations: Yes   Attends Archivist Meetings: More than 4 times per year   Marital Status: Married    Vitals:   01/11/21 1456  BP: 124/78  Pulse: 73  Resp: 16  Temp: 98 F (36.7 C)  SpO2: 96%   Body mass index is 26.11 kg/m.  Physical Exam Vitals and nursing note reviewed.  Constitutional:      General:  She is not in acute distress.    Appearance: She is well-developed. She is not ill-appearing.  HENT:     Head: Normocephalic and atraumatic.  Eyes:     Conjunctiva/sclera: Conjunctivae normal.  Cardiovascular:     Rate and Rhythm: Normal rate and regular rhythm.     Pulses:          Dorsalis pedis pulses are 2+ on the right side and 2+ on the left side.     Heart sounds: No murmur heard. Pulmonary:     Effort: Pulmonary effort is normal. No respiratory distress.  Musculoskeletal:  Right hip: Tenderness (With internal rotation and palpation of lateral aspect.) present. No bony tenderness. Normal range of motion.     Right knee: Crepitus present. No bony tenderness. Normal range of motion. No tenderness.     Left knee: Crepitus present. No bony tenderness. Normal range of motion. No tenderness.       Legs:     Comments: Bilateral knee: on inspection no effusion, erythema, or deformities. Valgus and varus stress normal, anterior and posterior drawer test negative. Patellar apprehension test negative.   Skin:    General: Skin is warm.     Findings: No erythema or rash.  Neurological:     Mental Status: She is alert and oriented to person, place, and time.  Psychiatric:        Mood and Affect: Mood is anxious.     Comments: Well groomed, good eye contact.   ASSESSMENT AND PLAN:  Ms.Clarece was seen today for back pain and hip pain.  Diagnoses and all orders for this visit: Orders Placed This Encounter  Procedures   DG Lumbar Spine Complete   DG Knee Complete 4 Views Right   Hip pain, right Possible etiologies discussed. Examination suggest trochanteric bursitis.We discussed dx,prognosis,and treatment options. She is not interested in steroid injection. Local ice, icy hot may help.  Chronic pain of right knee Bilateral but R>L, she would like X ray done. Most likely OA. Further recommendations according to X ray results.  Chronic bilateral low back pain without  sciatica Hx and examination do not suggest a serious process. Lumbar X ray ordered. Caution with Ibuprofen, some side effects discussed.  Insomnia, unspecified type Chronic. Good sleep hygiene to continue. In the past she tried Trazodone, may has caused headache, she would like to try again. Trazodone 25 mg 30 min before bedtimes. We discussed some side effects. If she decides to continue medication, recommend 3 months f/u.  -     traZODone (DESYREL) 50 MG tablet; Take 0.5 tablets (25 mg total) by mouth at bedtime as needed for sleep.  Fall, initial encounter No residual symptoms. Monitor for unusual/severe headache and/or MS changes within the next couple month. Fall precautions discussed.  Return if symptoms worsen or fail to improve. She is due for colonoscopy  12/2021.  Tobie Hellen G. [place], MD  [facility]. [place] office.

## 2021-02-03 LAB — HM MAMMOGRAPHY

## 2021-02-08 ENCOUNTER — Encounter: Payer: Self-pay | Admitting: Family Medicine

## 2021-02-11 ENCOUNTER — Other Ambulatory Visit: Payer: Self-pay | Admitting: Family Medicine

## 2021-02-16 ENCOUNTER — Other Ambulatory Visit: Payer: Self-pay | Admitting: Family Medicine

## 2021-02-28 DIAGNOSIS — Z20822 Contact with and (suspected) exposure to covid-19: Secondary | ICD-10-CM | POA: Diagnosis not present

## 2021-03-04 NOTE — Progress Notes (Signed)
HPI: Nancy Chandler is a 66 y.o. female with hx of anxiety, OA,HTN,and DM II here today c/o intermittent acid reflux and epigastric discomfort. GERD on Omeprazole 40 mg daily. She feels food is coming up when burping. No heartburn. Exacerbated by coffee, tomatoes,and even babble gum.  Dysphagia for bread,steak, and solids in general. She has no problems swallowing fluids.  Constipation has improved, now having daily bowel movements sine she started eating a banana daily.  Negative for nausea, vomiting, changes in bowel habits, blood in stool or melena. No exertional CP,SOB,or palpitations.  Review of Systems  Constitutional:  Negative for activity change, appetite change and fever.  HENT:  Negative for sore throat.   Respiratory:  Negative for cough and wheezing.   Endocrine: Negative for cold intolerance and heat intolerance.  Genitourinary:  Negative for decreased urine volume, dysuria and hematuria.  Musculoskeletal:  Positive for arthralgias. Negative for gait problem.  Skin:  Negative for pallor and rash.  Neurological:  Negative for syncope, weakness and headaches.  Rest see pertinent positives and negatives per HPI.  Current Outpatient Medications on File Prior to Visit  Medication Sig Dispense Refill   atorvastatin (LIPITOR) 20 MG tablet TAKE 1 TABLET BY MOUTH EVERY DAY 90 tablet 3   clobetasol ointment (TEMOVATE) 0.05 % Apply bid for itching for up to 5 days.  Call office if itching is not relieved after that time. 60 g 0   EPINEPHrine (EPIPEN 2-PAK) 0.3 mg/0.3 mL IJ SOAJ injection Inject 0.3 mg into the muscle as needed for anaphylaxis. 1 each 1   fluticasone (FLONASE) 50 MCG/ACT nasal spray Place into the nose as needed.     hydrochlorothiazide (MICROZIDE) 12.5 MG capsule Take 1 capsule (12.5 mg total) by mouth daily. 90 capsule 3   ibuprofen (ADVIL) 600 MG tablet Take 1 tablet (600 mg total) by mouth every 6 (six) hours as needed. 30 tablet 0   mometasone  (ELOCON) 0.1 % ointment Apply topically twice weekly 45 g 1   Multiple Vitamin (MULTIVITAMIN) tablet Take 1 tablet by mouth daily.     traZODone (DESYREL) 50 MG tablet Take 0.5 tablets (25 mg total) by mouth at bedtime as needed for sleep. 30 tablet 0   No current facility-administered medications on file prior to visit.    Past Medical History:  Diagnosis Date   Abnormal Pap smear of cervix    neg HPV HR+ 16/18 neg 2017, 2018 LGSIL HPV-, 2019 ASCUS HPV HR+, 08-21-2018 LGSIL HPV HR-   Anxiety    Depression    Diabetes mellitus without complication (HCC)    Dyspareunia    History of colon polyps    Hyperlipidemia    Hypertension    Lichen sclerosus    Thyroid disease    Allergies  Allergen Reactions   Bee Venom Anaphylaxis    Carries Epi pen    Hydrocodone Nausea And Vomiting    Social History   Socioeconomic History   Marital status: Married    Spouse name: Not on file   Number of children: 2   Years of education: Not on file   Highest education level: Not on file  Occupational History   Not on file  Tobacco Use   Smoking status: Never   Smokeless tobacco: Never  Vaping Use   Vaping Use: Never used  Substance and Sexual Activity   Alcohol use: No    Alcohol/week: 0.0 standard drinks   Drug use: No   Sexual activity: Yes  Partners: Male    Birth control/protection: Surgical    Comment: Tubal ligation  Other Topics Concern   Not on file  Social History Narrative   Not on file   Social Determinants of Health   Financial Resource Strain: Low Risk    Difficulty of Paying Living Expenses: Not hard at all  Food Insecurity: No Food Insecurity   Worried About Charity fundraiser in the Last Year: Never true   [place] in the Last Year: Never true  Transportation Needs: No Transportation Needs   Lack of Transportation (Medical): No   Lack of Transportation (Non-Medical): No  Physical Activity: Sufficiently Active   Days of Exercise per Week: 5 days    Minutes of Exercise per Session: 60 min  Stress: No Stress Concern Present   Feeling of Stress : Not at all  Social Connections: Socially Integrated   Frequency of Communication with Friends and Family: Twice a week   Frequency of Social Gatherings with Friends and Family: Twice a week   Attends Religious Services: More than 4 times per year   Active Member of [employer] or Organizations: Yes   Attends Archivist Meetings: More than 4 times per year   Marital Status: Married   Vitals:   03/07/21 1414  BP: 120/74  Pulse: 77  Resp: 16  SpO2: 99%   Body mass index is 26.04 kg/m.  Physical Exam Vitals and nursing note reviewed.  Constitutional:      General: She is not in acute distress.    Appearance: She is well-developed.  HENT:     Head: Normocephalic and atraumatic.     Mouth/Throat:     Mouth: Mucous membranes are moist.     Pharynx: Oropharynx is clear.  Eyes:     Conjunctiva/sclera: Conjunctivae normal.  Cardiovascular:     Rate and Rhythm: Normal rate and regular rhythm.     Heart sounds: No murmur heard. Pulmonary:     Effort: Pulmonary effort is normal. No respiratory distress.     Breath sounds: Normal breath sounds.  Abdominal:     Palpations: Abdomen is soft. There is no hepatomegaly or mass.     Tenderness: There is no abdominal tenderness.  Lymphadenopathy:     Cervical: No cervical adenopathy.  Skin:    General: Skin is warm.     Findings: No erythema or rash.  Neurological:     General: No focal deficit present.     Mental Status: She is alert and oriented to person, place, and time.     Cranial Nerves: No cranial nerve deficit.     Gait: Gait normal.  Psychiatric:     Comments: Well groomed, good eye contact.   ASSESSMENT AND PLAN:  [name] was seen today for follow-up.  Diagnoses and all orders for this visit: Orders Placed This Encounter  Procedures   Ambulatory referral to Gastroenterology   Gastroesophageal reflux disease  without esophagitis Problem is not well controlled. Omeprazole 40 mg stop. Protonix 40 mg started today. GERD precautions discussed.  -     pantoprazole (PROTONIX) 40 MG tablet; Take 1 tablet (40 mg total) by mouth daily.  Dysphagia, unspecified type We discussed possible etiologies. She has had problem since 12/2019, so she may need EGD. Instructed about warning signs. GI referral placed.  Return if symptoms worsen or fail to improve, for Keep next appt.  Abdon Petrosky G. [place], MD  [facility]. [place] office.

## 2021-03-07 ENCOUNTER — Ambulatory Visit (INDEPENDENT_AMBULATORY_CARE_PROVIDER_SITE_OTHER): Payer: Medicare Other | Admitting: Family Medicine

## 2021-03-07 ENCOUNTER — Encounter: Payer: Self-pay | Admitting: Family Medicine

## 2021-03-07 VITALS — BP 120/74 | HR 77 | Resp 16 | Ht 63.0 in | Wt 147.0 lb

## 2021-03-07 DIAGNOSIS — R1310 Dysphagia, unspecified: Secondary | ICD-10-CM | POA: Diagnosis not present

## 2021-03-07 DIAGNOSIS — K219 Gastro-esophageal reflux disease without esophagitis: Secondary | ICD-10-CM

## 2021-03-07 MED ORDER — PANTOPRAZOLE SODIUM 40 MG PO TBEC: 40.0000 mg | DELAYED_RELEASE_TABLET | Freq: Every day | ORAL | 3 refills | Status: DC

## 2021-03-07 NOTE — Patient Instructions (Addendum)
A few things to remember from today's visit:  Gastroesophageal reflux disease without esophagitis - Plan: Ambulatory referral to Gastroenterology  Dysphagia, unspecified type - Plan: Ambulatory referral to Gastroenterology  If you need refills please call your pharmacy. Do not use My Chart to request refills or for acute issues that need immediate attention.   Stop omeprazole and start Protonix, 30 min before breakfast. Appt with gastro will be arranged.  Please be sure medication list is accurate. If a new problem present, please set up appointment sooner than planned today. Barrett's Esophagus Barrett's esophagus occurs when the tissue that lines the esophagus changes or becomes damaged. The esophagus is the tube that carries food from the throat to the stomach. With Barrett's esophagus, the cells that line the esophagus are replaced by cells that are similar to the lining of the intestines (intestinal metaplasia). Barrett's esophagus itself may not cause any symptoms. However, many people who have Barrett's esophagus also have gastroesophageal reflux disease (GERD), which may cause symptoms such as heartburn. Over time, a few people with this condition may develop cancer of the esophagus. Treatment may include medicines, procedures to destroy the abnormal cells, or surgery. What are the causes? The exact cause of this condition is not known. In some cases, the condition develops from damage to the lining of the esophagus caused by gastroesophageal reflux disease (GERD). GERD occurs when stomach acids flow up from the stomach into the esophagus. Frequent symptoms of GERD may cause intestinal metaplasia or cause cell changes (dysplasia). What increases the risk? You are more likely to develop this condition if you: Have GERD. Are female. Are of European descent. Are obese. Are older than 50. Have a hiatal hernia. This is a condition in which part of your stomach bulges into your  chest. Smoke. What are the signs or symptoms? People with Barrett's esophagus often have no symptoms. However, many people with this condition also have GERD. Symptoms of GERD may include: Heartburn. Difficulty swallowing. Dry cough. How is this diagnosed? This condition may be diagnosed based on: Results of an upper gastrointestinal endoscopy. For this exam, a thin, flexible tube with a light and a camera on the end (endoscope) is passed down your esophagus. Your health care provider can view the inside of your esophagus during this procedure. Results of a biopsy. For this procedure, several tissue samples are removed (biopsy) from your esophagus to look at under a microscope. They are then checked for intestinal metaplasia or dysplasia. How is this treated? Treatment for this condition may include: Medicines (proton pump inhibitors, or PPIs) to decrease or stop GERD. Periodic endoscopic exams to make sure that cancer is not developing. A procedure or surgery for dysplasia. This may include: Removal or destruction of abnormal cells. Removal of part of the esophagus. Follow these instructions at home: Eating and drinking Eat more fruits and vegetables. Avoid fatty foods. Eat small, frequent meals instead of large meals. Avoid foods that cause heartburn. These foods include: Coffee and alcoholic drinks. Tomatoes and foods made with tomatoes. Greasy or spicy foods. Chocolate and peppermint. Do not drink alcohol. General instructions Take over-the-counter and prescription medicines only as told by your health care provider. Do not use any products that contain nicotine or tobacco, such as cigarettes, e-cigarettes, and chewing tobacco. If you need help quitting, ask your health care provider. If you are being treated for GERD, make sure you take medicines and follow all instructions as told by your health care provider. Keep all follow-up visits  as told by your health care provider. This  is important. Contact a health care provider if: You have heartburn or GERD symptoms. You have difficulty swallowing. Get help right away if: You have chest pain. You are unable to swallow. You vomit blood or material that looks like coffee grounds. Your stool (feces) is bright red or dark. These symptoms may represent a serious problem that is an emergency. Do not wait to see if the symptoms will go away. Get medical help right away. Call your local emergency services (911 in the [place] Do not drive yourself to the hospital. Summary Barrett's esophagus occurs when the tissue that lines the esophagus changes or becomes damaged. Barrett's esophagus may be diagnosed with an upper gastrointestinal endoscopy and a biopsy. Treatment may include medicines, procedures to remove abnormal cells, or surgery. Follow your health care provider's instructions about what to eat and drink, what medicines to take, and when to call for help. This information is not intended to replace advice given to you by your health care provider. Make sure you discuss any questions you have with your health care provider. Document Revised: 04/26/2019 Document Reviewed: 04/26/2019 Elsevier Patient Education  [place].

## 2021-03-11 ENCOUNTER — Other Ambulatory Visit: Payer: Self-pay

## 2021-03-11 DIAGNOSIS — G4700 Insomnia, unspecified: Secondary | ICD-10-CM

## 2021-03-11 MED ORDER — TRAZODONE HCL 50 MG PO TABS: 25.0000 mg | ORAL_TABLET | Freq: Every evening | ORAL | 2 refills | Status: DC | PRN

## 2021-03-28 ENCOUNTER — Ambulatory Visit (INDEPENDENT_AMBULATORY_CARE_PROVIDER_SITE_OTHER): Payer: Medicare Other | Admitting: Family Medicine

## 2021-03-28 ENCOUNTER — Encounter: Payer: Self-pay | Admitting: Family Medicine

## 2021-03-28 VITALS — BP 136/80 | HR 75 | Resp 16 | Ht 63.0 in | Wt 144.5 lb

## 2021-03-28 DIAGNOSIS — E785 Hyperlipidemia, unspecified: Secondary | ICD-10-CM

## 2021-03-28 DIAGNOSIS — E1169 Type 2 diabetes mellitus with other specified complication: Secondary | ICD-10-CM

## 2021-03-28 DIAGNOSIS — I739 Peripheral vascular disease, unspecified: Secondary | ICD-10-CM

## 2021-03-28 LAB — POCT GLYCOSYLATED HEMOGLOBIN (HGB A1C): HbA1c, POC (prediabetic range): 6 % (ref 5.7–6.4)

## 2021-03-28 NOTE — Progress Notes (Signed)
HPI: [name] is a 66 y.o. female, who is here today to follow on recent home visit. She had PAD screening done at home and it was abnormal: RLE 0.49 and LLE 0.94. She has not had leg pain, hx of OA, so has had hip pain after walking. But same day after test was done she had RLE achy sensation, posterior aspect of thigh when she was in bed 3 days ago. Results have exacerbated her anxiety.  She has not noted LE edema, erythema,numbness,tingling,or skin lesions. HLD on Atorvastatin 20 mg daily, which she has tolerated well. She has an appt with her cardiologist tomorrow for annual follow up, she was initially evaluated because palpitations.  Carotid US in 02/2016:Bilateral: intimal wall thickening CCA. 1-39% ICA plaquing. Vertebral artery flow is antegrade.   Lab Results  Component Value Date   CHOL 176 10/27/2020   HDL 63.70 10/27/2020   LDLCALC 99 10/27/2020   TRIG 67.0 10/27/2020   CHOLHDL 3 10/27/2020   HTN on HCTZ 12.5 mg daily.  DM II: Dx'ed in 2016 with HgA1C 6.5. She is on nonpharmacologic treatment. She is not checking BS. Negative for abdominal pain, nausea,vomiting, polydipsia,polyuria, or polyphagia.  Lab Results  Component Value Date   HGBA1C 6.3 10/27/2020   Review of Systems  Constitutional:  Negative for activity change, appetite change, chills and fever.  Respiratory:  Negative for cough and wheezing.   Cardiovascular:  Negative for chest pain.  Musculoskeletal:  Positive for arthralgias. Negative for gait problem.  Skin:  Negative for pallor and wound.  Neurological:  Negative for syncope, facial asymmetry, weakness and headaches.  Psychiatric/Behavioral:  Negative for confusion. The patient is nervous/anxious.   Rest see pertinent positives and negatives per HPI.  Current Outpatient Medications on File Prior to Visit  Medication Sig Dispense Refill   atorvastatin (LIPITOR) 20 MG tablet TAKE 1 TABLET BY MOUTH EVERY DAY 90 tablet 3    clobetasol ointment (TEMOVATE) 0.05 % Apply bid for itching for up to 5 days.  Call office if itching is not relieved after that time. 60 g 0   EPINEPHrine (EPIPEN 2-PAK) 0.3 mg/0.3 mL IJ SOAJ injection Inject 0.3 mg into the muscle as needed for anaphylaxis. 1 each 1   fluticasone (FLONASE) 50 MCG/ACT nasal spray Place into the nose as needed.     hydrochlorothiazide (MICROZIDE) 12.5 MG capsule Take 1 capsule (12.5 mg total) by mouth daily. 90 capsule 3   ibuprofen (ADVIL) 600 MG tablet Take 1 tablet (600 mg total) by mouth every 6 (six) hours as needed. 30 tablet 0   mometasone (ELOCON) 0.1 % ointment Apply topically twice weekly 45 g 1   Multiple Vitamin (MULTIVITAMIN) tablet Take 1 tablet by mouth daily.     pantoprazole (PROTONIX) 40 MG tablet Take 1 tablet (40 mg total) by mouth daily. 30 tablet 3   No current facility-administered medications on file prior to visit.    Past Medical History:  Diagnosis Date   Abnormal Pap smear of cervix    neg HPV HR+ 16/18 neg 2017, 2018 LGSIL HPV-, 2019 ASCUS HPV HR+, 08-21-2018 LGSIL HPV HR-   Anxiety    Depression    Diabetes mellitus without complication (HCC)    Dyspareunia    History of colon polyps    Hyperlipidemia    Hypertension    Lichen sclerosus    Thyroid disease    Allergies  Allergen Reactions   Bee Venom Anaphylaxis    Carries Epi pen  Hydrocodone Nausea And Vomiting    Social History   Socioeconomic History   Marital status: Married    Spouse name: Not on file   Number of children: 2   Years of education: Not on file   Highest education level: Not on file  Occupational History   Not on file  Tobacco Use   Smoking status: Never   Smokeless tobacco: Never  Vaping Use   Vaping Use: Never used  Substance and Sexual Activity   Alcohol use: No    Alcohol/week: 0.0 standard drinks   Drug use: No   Sexual activity: Yes    Partners: Male    Birth control/protection: Surgical    Comment: Tubal ligation  Other  Topics Concern   Not on file  Social History Narrative   Not on file   Social Determinants of Health   Financial Resource Strain: Low Risk    Difficulty of Paying Living Expenses: Not hard at all  Food Insecurity: No Food Insecurity   Worried About Charity fundraiser in the Last Year: Never true   Council in the Last Year: Never true  Transportation Needs: No Transportation Needs   Lack of Transportation (Medical): No   Lack of Transportation (Non-Medical): No  Physical Activity: Sufficiently Active   Days of Exercise per Week: 5 days   Minutes of Exercise per Session: 60 min  Stress: No Stress Concern Present   Feeling of Stress : Not at all  Social Connections: Socially Integrated   Frequency of Communication with Friends and Family: Twice a week   Frequency of Social Gatherings with Friends and Family: Twice a week   Attends Religious Services: More than 4 times per year   Active Member of [employer] or Organizations: Yes   Attends Archivist Meetings: More than 4 times per year   Marital Status: Married   Vitals:   03/28/21 1421  BP: 136/80  Pulse: 75  Resp: 16  SpO2: 98%   Body mass index is 25.6 kg/m.  Physical Exam Vitals and nursing note reviewed.  Constitutional:      General: She is not in acute distress.    Appearance: She is well-developed.  HENT:     Head: Normocephalic and atraumatic.  Eyes:     Conjunctiva/sclera: Conjunctivae normal.  Cardiovascular:     Rate and Rhythm: Normal rate and regular rhythm.     Pulses:          Dorsalis pedis pulses are 2+ on the right side and 2+ on the left side.       Posterior tibial pulses are 2+ on the left side.     Heart sounds: No murmur heard.    Comments: I had difficulty finding right PT. Pulmonary:     Effort: Pulmonary effort is normal. No respiratory distress.     Breath sounds: Normal breath sounds.  Abdominal:     Palpations: Abdomen is soft. There is no mass.     Tenderness: There  is no abdominal tenderness.  Skin:    General: Skin is warm.     Findings: No erythema or rash.  Neurological:     General: No focal deficit present.     Mental Status: She is alert and oriented to person, place, and time.     Cranial Nerves: No cranial nerve deficit.     Gait: Gait normal.  Psychiatric:        Mood and Affect: Mood is anxious.  Comments: Well groomed, good eye contact.   ASSESSMENT AND PLAN:  [name] was seen today for follow-up.  Diagnoses and all orders for this visit: Orders Placed This Encounter  Procedures   POC HgB A1c   VAS [place] ABI WITH/WO TBI   Lab Results  Component Value Date   HGBA1C 6.0 03/28/2021   PAD (peripheral artery disease) (HCC) Abnormal RLE home screening. No claudication like symptoms. ABI will be arranged and further recommendations will be given according to results. Continue Atorvastatin 20 mg daily and appropriate foot care.  Type 2 diabetes mellitus with other specified complication (HCC) [id number] at goal, went from 6.3 to 6.0. Continue non pharmacologic treatment. Regular exercise and healthy diet with avoidance of added sugar food intake encouraged. Annual eye exam, periodic dental and foot care to continue. F/U in 5-6 months   Hyperlipidemia Last LDL 99 in 10/2020. Continue Atorvastatin 20 mg daily. Will plan on rechecking FLP next visit if not done by cardiologist tomorrow. LDL goal will change depending on ABI result.  Return in about 6 months (around 09/25/2021).  Jalaila Caradonna G. Martinique, MD  [facility]. [place] office.

## 2021-03-28 NOTE — Patient Instructions (Signed)
A few things to remember from today's visit:  PAD (peripheral artery disease) ([place]) - Plan: VAS [place] ABI WITH/WO TBI  Type 2 diabetes mellitus with other specified complication, without long-term current use of insulin ([place]) - Plan: POC HgB A1c  If you need refills please call your pharmacy. Do not use My Chart to request refills or for acute issues that need immediate attention.   We are going to arrange circulation test. No changes in Atorvastatin for now.  Please be sure medication list is accurate. If a new problem present, please set up appointment sooner than planned today.

## 2021-03-28 NOTE — Assessment & Plan Note (Signed)
HgA1C at goal, went from 6.3 to 6.0. Continue non pharmacologic treatment. Regular exercise and healthy diet with avoidance of added sugar food intake encouraged. Annual eye exam, periodic dental and foot care to continue. F/U in 5-6 months

## 2021-03-28 NOTE — Assessment & Plan Note (Signed)
Last LDL 99 in 10/2020. Continue Atorvastatin 20 mg daily. Will plan on rechecking FLP next visit if not done by cardiologist tomorrow. LDL goal will change depending on ABI result.

## 2021-03-29 ENCOUNTER — Ambulatory Visit (HOSPITAL_BASED_OUTPATIENT_CLINIC_OR_DEPARTMENT_OTHER): Payer: Medicare Other | Admitting: Cardiology

## 2021-03-29 ENCOUNTER — Other Ambulatory Visit: Payer: Self-pay

## 2021-03-29 VITALS — BP 126/78 | HR 72 | Ht 63.0 in | Wt 145.6 lb

## 2021-03-29 DIAGNOSIS — R6889 Other general symptoms and signs: Secondary | ICD-10-CM

## 2021-03-29 DIAGNOSIS — I10 Essential (primary) hypertension: Secondary | ICD-10-CM | POA: Diagnosis not present

## 2021-03-29 DIAGNOSIS — E7800 Pure hypercholesterolemia, unspecified: Secondary | ICD-10-CM

## 2021-03-29 DIAGNOSIS — Z7189 Other specified counseling: Secondary | ICD-10-CM | POA: Diagnosis not present

## 2021-03-29 NOTE — Patient Instructions (Signed)

## 2021-03-29 NOTE — Progress Notes (Signed)
Cardiology Office Note:    Date:  03/29/2021   ID:  Monaye, Blackie Feb 20, 1956, MRN [id number]  PCP:  Martinique, Betty G, MD  Cardiologist:  Buford Dresser, MD  Referring MD: Martinique, Betty G, MD   CC: follow up  History of Present Illness:    Nancy Chandler is a 66 y.o. female with a hx of hypertension who is seen for follow up. I initially met her 04/30/19 as a new consult at the request of Martinique, Malka So, MD for the evaluation and management of palpitations.  Today: Had been doing well, is following with GI for GERD. Had a home health nurse visit as a part of her insurance. Was told that the circulation in her legs was not good. Per Dr. Doug Sou note yesterday, home readings note ABI on her RLE 0.49, LLE 0.94. Since then she has been nervous about her leg. No nonhealing wounds, no rest pain, no claudication. Noted good pulses except for right PT at her visit. ABIs have been ordered. She is already on a statin.  Denies chest pain, shortness of breath at rest or with normal exertion. No PND, orthopnea, LE edema or unexpected weight gain. No syncope or palpitations.   Has occasional knee tenderness, similar to prior.  Past Medical History:  Diagnosis Date   Abnormal Pap smear of cervix    neg HPV HR+ 16/18 neg 2017, 2018 LGSIL HPV-, 2019 ASCUS HPV HR+, 08-21-2018 LGSIL HPV HR-   Anxiety    Depression    Diabetes mellitus without complication ([place])    Dyspareunia    History of colon polyps    Hyperlipidemia    Hypertension    Lichen sclerosus    Thyroid disease     Past Surgical History:  Procedure Laterality Date   COLPOSCOPY  2010, 2018, 2019   History of LSIL, ASCUS + HPV   LYMPH NODE BIOPSY     TUBAL LIGATION  1990    Current Medications: Current Outpatient Medications on File Prior to Visit  Medication Sig   atorvastatin (LIPITOR) 20 MG tablet TAKE 1 TABLET BY MOUTH EVERY DAY   clobetasol ointment (TEMOVATE) 0.05 % Apply bid for itching for up to  5 days.  Call office if itching is not relieved after that time.   EPINEPHrine (EPIPEN 2-PAK) 0.3 mg/0.3 mL IJ SOAJ injection Inject 0.3 mg into the muscle as needed for anaphylaxis.   fluticasone (FLONASE) 50 MCG/ACT nasal spray Place into the nose as needed.   hydrochlorothiazide (MICROZIDE) 12.5 MG capsule Take 1 capsule (12.5 mg total) by mouth daily.   ibuprofen (ADVIL) 600 MG tablet Take 1 tablet (600 mg total) by mouth every 6 (six) hours as needed.   Multiple Vitamin (MULTIVITAMIN) tablet Take 1 tablet by mouth daily.   pantoprazole (PROTONIX) 40 MG tablet Take 1 tablet (40 mg total) by mouth daily.   No current facility-administered medications on file prior to visit.     Allergies:   Bee venom and Hydrocodone   Social History   Tobacco Use   Smoking status: Never   Smokeless tobacco: Never  Vaping Use   Vaping Use: Never used  Substance Use Topics   Alcohol use: No    Alcohol/week: 0.0 standard drinks   Drug use: No    Family History: family history includes Alcohol abuse in her father; Arthritis in her maternal grandmother; Birth defects in her daughter; Breast cancer in her paternal aunt; Cancer in her maternal grandfather and mother; Diabetes  in her father; Hearing loss in her brother; Heart disease in her maternal grandmother, paternal aunt, and paternal grandmother; Hyperlipidemia in her brother; Hypertension in her brother and father. both of her grandmothers had congestive heart failure. brother had a stroke due to Covid.  ROS:   Please see the history of present illness.  Additional pertinent ROS otherwise unremarkable.  EKGs/Labs/Other Studies Reviewed:    The following studies were reviewed today: Echo 02/2016:  Left ventricle: The cavity size was normal. There was mild   concentric hypertrophy. Systolic function was normal. The   estimated ejection fraction was in the range of 60% to 65%. Wall    motion was normal; there were no regional wall motion     abnormalities.  - Mitral valve: There was mild regurgitation.   Nuclear stress test in 10/2014. The ejection fraction is normal. Wall motion is normal. There is no scar or ischemia. This is a low risk scan. Overall the study is normal.  EKG:  EKG is personally reviewed.   03/29/21: NSR at 72 bpm  Recent Labs: 10/27/2020: ALT 13; BUN 11; Creatinine, Ser 0.84; Potassium 3.9; Sodium 142  Recent Lipid Panel    Component Value Date/Time   CHOL 176 10/27/2020 0822   TRIG 67.0 10/27/2020 0822   HDL 63.70 10/27/2020 0822   CHOLHDL 3 10/27/2020 0822   VLDL 13.4 10/27/2020 0822   LDLCALC 99 10/27/2020 0822   LDLCALC 106 (H) 10/07/2019 0815    Physical Exam:    VS:  BP 126/78 (BP Location: Left Arm, Patient Position: Sitting, Cuff Size: Normal)    Pulse 72    Ht _0  (1.6 m)    Wt 145 lb 9.6 oz (66 kg)    LMP 02/21/2008    BMI 25.79 kg/m     Wt Readings from Last 3 Encounters:  03/29/21 145 lb 9.6 oz (66 kg)  03/28/21 144 lb 8 oz (65.5 kg)  03/07/21 147 lb (66.7 kg)    GEN: Well nourished, well developed in no acute distress HEENT: Normal, moist mucous membranes NECK: No JVD CARDIAC: regular rhythm, normal S1 and S2, no rubs or gallops. No murmur. VASCULAR: Radial and DP pulses 2+ bilaterally. PT pulse 2+ on left, 1+ on right. No carotid bruits RESPIRATORY:  Clear to auscultation without rales, wheezing or rhonchi  ABDOMEN: Soft, non-tender, non-distended MUSCULOSKELETAL:  Ambulates independently SKIN: Warm and dry, no edema NEUROLOGIC:  Alert and oriented x 3. No focal neuro deficits noted. PSYCHIATRIC:  Normal affect    ASSESSMENT:    1. Essential hypertension   2. Abnormal ankle brachial index (ABI)   3. Cardiac risk counseling   4. Counseling on health promotion and disease prevention   5. Pure hypercholesterolemia     PLAN:    Abnormal ABI: performed by home nurse. Palpable pulses on exam today, though R PT slightly diminished. Normal cap refill. No symptoms  concerning for PAD -repeat ABI already ordered by Dr. Martinique  Palpitations: resolved -has Kardiamobile  Hypertension: well controlled today -continue HCTZ 12.5 mg daily  Hyper cholesterolemia: -no known history of ASCVD, reasonable to aim for LDL ~100 -last LDL 99 10/27/20 -continue atorvastatin 20 mg daily  Cardiac risk counseling and prevention recommendations: -recommend heart healthy/Mediterranean diet, with whole grains, fruits, vegetable, fish, lean meats, nuts, and olive oil. Limit salt. -recommend moderate walking, 3-5 times/week for 30-50 minutes each session. Aim for at least 150 minutes.week. Goal should be pace of 3 miles/hours, or walking 1.5 miles in  30 minutes -recommend avoidance of tobacco products. Avoid excess alcohol. -ASCVD risk score: The 10-year ASCVD risk score (Arnett DK, et al., 2019) is: 18.4%   Values used to calculate the score:     Age: 57 years     Sex: Female     Is Non-Hispanic African American: Yes     Diabetic: Yes     Tobacco smoker: No     Systolic Blood Pressure: 981 mmHg     Is BP treated: Yes     HDL Cholesterol: 63.7 mg/dL     Total Cholesterol: 176 mg/dL    Plan for follow up: 1 year or sooner as needed  Buford Dresser, MD, PhD    [facility]    Medication Adjustments/Labs and Tests Ordered: Current medicines are reviewed at length with the patient today.  Concerns regarding medicines are outlined above.  Orders Placed This Encounter  Procedures   EKG 12-Lead   No orders of the defined types were placed in this encounter.   Patient Instructions  Medication Instructions:  Your Physician recommend you continue on your current medication as directed.    *If you need a refill on your cardiac medications before your next appointment, please call your pharmacy*   Lab Work: None ordered today   Testing/Procedures: None ordered today   Follow-Up: At [facility], you and your health needs are our  priority.  As part of our continuing mission to provide you with exceptional heart care, we have created designated Provider Care Teams.  These Care Teams include your primary Cardiologist (physician) and Advanced Practice Providers (APPs -  Physician Assistants and Nurse Practitioners) who all work together to provide you with the care you need, when you need it.  We recommend signing up for the patient portal called "MyChart".  Sign up information is provided on this After Visit Summary.  MyChart is used to connect with patients for Virtual Visits (Telemedicine).  Patients are able to view lab/test results, encounter notes, upcoming appointments, etc.  Non-urgent messages can be sent to your provider as well.   To learn more about what you can do with MyChart, go to [email or web address].    Your next appointment:   1 year(s)  The format for your next appointment:   In Person  Provider:   Buford Dresser, MD     Signed, Buford Dresser, MD PhD 03/29/2021   [place]

## 2021-04-04 ENCOUNTER — Encounter (HOSPITAL_BASED_OUTPATIENT_CLINIC_OR_DEPARTMENT_OTHER): Payer: Self-pay | Admitting: Cardiology

## 2021-04-05 NOTE — Progress Notes (Signed)
04/05/2021 Nancy Chandler [id number] 1955/08/19   ASSESSMENT AND PLAN:   Gastroesophageal reflux disease without esophagitis Dysphagia, unspecified type Continue pantoprazole Will schedule EGD to evaluate for possible H. pylori, eosinophilic esophagitis, peptic ulcer disease, or strictures. I discussed risks of EGD with patient today, including risk of sedation, bleeding or perforation.  Patient provides understanding and gave verbal consent to proceed. Normal LFTs but if negative can consider RUQ [place]  History of colonic polyps Change in stool habits Family history of colon cancer in mother Has been on miralax x Jan, change in BM's per patient, no blood, with family history and being so close to repeat, will schedule with EGD We have discussed the risks of bleeding, infection, perforation, medication reactions, a 10-20% miss rate for small colon cancer or polyp and remote risk of death associated with colonoscopy. All questions were answered and the patient acknowledges these risk and wishes to proceed.    Future Appointments  Date Time Provider [place]  04/06/2021 10:30 AM Delfina Redwood [facility]  04/07/2021  3:00 PM [facility]-CV HS VASC 6 - [facility]-H[facility] V[facility]  06/23/2021  8:15 AM Megan Salon, MD [facility]OBGYN [facility]    Patient Care Team: [place], Betty G, MD as PCP - General (Family Medicine) Buford Dresser, MD as PCP - Cardiology (Cardiology)  HISTORY OF PRESENT ILLNESS: 66 y.o. female referred by [place], Betty G, MD, with a past medical history of type 2 diabetes, hypertension, hyperlipidemia and others listed below presents for evaluation of GERD.   Patient has been having GERD, has had for years however in the last 4 months has been more prevalent. Having reflux/burning chest pain with any food, worse with pizza, tomatoes, cucumbers.  Just started pantoprazole about 4 weeks ago which is improving symptoms. No nocturnal symptoms. She states she  has dysphagia with bread, popcorn feels pharyngeal/esophageal it will get stuck or lower esophagus, this is intermittent, no issues with liquids, no choking with foods.  Denies nausea, vomiting, melena.  She was having constipation, on miralax which is helping. Patient denies diarrhea, hematochezia.  Denies changes in appetite, unintentional weight loss.   Has appointment with vascular tomorrow to evaluate for PAD, no pain in legs when she walks. Denies swelling in legs, no CP or SOB.   ibuprofen 600 mg every 6 hours very RARE for headache. Denies ETOH, smoking, blood thinners.  Maternal GM with gallbladder removal.   External labs and notes reviewed this visit: Colonoscopy 01/02/2017 (12/2021) With Dr. Penelope Coop at [facility], completely normal.   Recall 5 years due to personal history of colon polyps and family history.- mother with colon cancer at age 50  04/14/2019 HGB 13.4 MCV 82.3 without evidence of anemia WBC 5.5 Platelets 169.0 10/27/2020 BUN 11 Cr 0.84  GFR 65  Potassium 3.9   10/27/2020 AST 14 ALT 13 Alkphos 74 TBili 0.4 04/14/2019 TSH 0.45  Current Medications:    Current Outpatient Medications (Cardiovascular):    atorvastatin (LIPITOR) 20 MG tablet, TAKE 1 TABLET BY MOUTH EVERY DAY   EPINEPHrine (EPIPEN 2-PAK) 0.3 mg/0.3 mL IJ SOAJ injection, Inject 0.3 mg into the muscle as needed for anaphylaxis.   hydrochlorothiazide (MICROZIDE) 12.5 MG capsule, Take 1 capsule (12.5 mg total) by mouth daily.  Current Outpatient Medications (Respiratory):    fluticasone (FLONASE) 50 MCG/ACT nasal spray, Place into the nose as needed.  Current Outpatient Medications (Analgesics):    ibuprofen (ADVIL) 600 MG tablet, Take 1 tablet (600 mg total) by mouth  every 6 (six) hours as needed.   Current Outpatient Medications (Other):    clobetasol ointment (TEMOVATE) 0.05 %, Apply bid for itching for up to 5 days.  Call office if itching is not relieved after that time.   Multiple Vitamin  (MULTIVITAMIN) tablet, Take 1 tablet by mouth daily.   pantoprazole (PROTONIX) 40 MG tablet, Take 1 tablet (40 mg total) by mouth daily.  Medical History:  Past Medical History:  Diagnosis Date   Abnormal Pap smear of cervix    neg HPV HR+ 16/18 neg 2017, 2018 LGSIL HPV-, 2019 ASCUS HPV HR+, 08-21-2018 LGSIL HPV HR-   Anxiety    Depression    Diabetes mellitus without complication ([place])    Dyspareunia    History of colon polyps    Hyperlipidemia    Hypertension    Lichen sclerosus    Thyroid disease    Allergies:  Allergies  Allergen Reactions   Bee Venom Anaphylaxis    Carries Epi pen    Hydrocodone Nausea And Vomiting     Surgical History:  She  has a past surgical history that includes Colposcopy (2010, 2018, 2019); Tubal ligation (1990); and Lymph node biopsy. Family History:  Her family history includes Alcohol abuse in her father; Arthritis in her maternal grandmother; Birth defects in her daughter; Breast cancer in her paternal aunt; Cancer in her maternal grandfather and mother; Diabetes in her father; Hearing loss in her brother; Heart disease in her maternal grandmother, paternal aunt, and paternal grandmother; Hyperlipidemia in her brother; Hypertension in her brother and father. Social History:   reports that she has never smoked. She has never used smokeless tobacco. She reports that she does not drink alcohol and does not use drugs.  REVIEW OF SYSTEMS  : All other systems reviewed and negative except where noted in the History of Present Illness.   PHYSICAL EXAM: LMP 02/21/2008  General:   Pleasant, well developed female in no acute distress Head:  Normocephalic and atraumatic. Eyes: sclerae anicteric,conjunctive pink  Heart:  regular rate and rhythm Pulm: Clear anteriorly; no wheezing Abdomen:  Soft, Flat AB, skin exam normal, Normal bowel sounds. mild tenderness in the epigastrium. Without guarding and Without rebound, without hepatomegaly. Extremities:   Without edema. Msk:  Symmetrical without gross deformities. Peripheral pulses intact.  Neurologic:  Alert and  oriented x4;  grossly normal neurologically. Skin:   Dry and intact without significant lesions or rashes. Psychiatric: Demonstrates good judgement and reason without abnormal affect or behaviors.   Vladimir Crofts, PA-C 8:19 AM

## 2021-04-06 ENCOUNTER — Ambulatory Visit: Payer: Medicare Other | Admitting: Physician Assistant

## 2021-04-06 ENCOUNTER — Encounter: Payer: Self-pay | Admitting: Physician Assistant

## 2021-04-06 VITALS — BP 148/70 | HR 76 | Ht 63.0 in | Wt 145.6 lb

## 2021-04-06 DIAGNOSIS — Z860100 Personal history of colon polyps, unspecified: Secondary | ICD-10-CM | POA: Insufficient documentation

## 2021-04-06 DIAGNOSIS — K219 Gastro-esophageal reflux disease without esophagitis: Secondary | ICD-10-CM | POA: Diagnosis not present

## 2021-04-06 DIAGNOSIS — Z800 Family history of malignant neoplasm of digestive organs: Secondary | ICD-10-CM

## 2021-04-06 DIAGNOSIS — R194 Change in bowel habit: Secondary | ICD-10-CM

## 2021-04-06 DIAGNOSIS — E1169 Type 2 diabetes mellitus with other specified complication: Secondary | ICD-10-CM | POA: Diagnosis not present

## 2021-04-06 DIAGNOSIS — R1310 Dysphagia, unspecified: Secondary | ICD-10-CM | POA: Diagnosis not present

## 2021-04-06 MED ORDER — NA SULFATE-K SULFATE-MG SULF 17.5-3.13-1.6 GM/177ML PO SOLN: 1.0000 | Freq: Once | ORAL | 0 refills | Status: AC

## 2021-04-06 NOTE — Patient Instructions (Addendum)
If you are age 67 or older, your body mass index should be between 23-30. Your Body mass index is 25.79 kg/m. If this is out of the aforementioned range listed, please consider follow up with your Primary Care Provider. ________________________________________________________  The [facility] providers would like to encourage you to use [facility] to communicate with providers for non-urgent requests or questions.  Due to long hold times on the telephone, sending your provider a message by [facility] may be a faster and more efficient way to get a response.  Please allow 48 business hours for a response.  Please remember that this is for non-urgent requests.  _______________________________________________________  Nancy Chandler have been scheduled for an endoscopy and colonoscopy. Please follow the written instructions given to you at your visit today. Please pick up your prep supplies at the pharmacy within the next 1-3 days. If you use inhalers (even only as needed), please bring them with you on the day of your procedure.  Continue Pantoprazole  Please take your proton pump inhibitor medication 30 minutes to 1 hour before meals- this makes it more effective.  Avoid spicy and acidic foods Avoid fatty foods Limit your intake of coffee, tea, alcohol, and carbonated drinks Work to maintain a healthy weight Keep the head of the bed elevated at least 3 inches with blocks or a wedge pillow if you are having any nighttime symptoms Stay upright for 2 hours after eating Avoid meals and snacks three to four hours before bedtime Stop smoking  Follow up pending the results of your Colonoscopy/Endoscopy or as needed.  Thank you for entrusting me with your care and choosing [facility].  Vicie Mutters, PA-C  Gastroesophageal Reflux Disease, Adult Gastroesophageal reflux (GER) happens when acid from the stomach flows up into the tube that connects the mouth and the stomach (esophagus). Normally, food  travels down the esophagus and stays in the stomach to be digested. However, when a person has GER, food and stomach acid sometimes move back up into the esophagus. If this becomes a more serious problem, the person may be diagnosed with a disease called gastroesophageal reflux disease (GERD). GERD occurs when the reflux: Happens often. Causes frequent or severe symptoms. Causes problems such as damage to the esophagus. When stomach acid comes in contact with the esophagus, the acid may cause inflammation in the esophagus. Over time, GERD may create small holes (ulcers) in the lining of the esophagus. What are the causes? This condition is caused by a problem with the muscle between the esophagus and the stomach (lower esophageal sphincter, or LES). Normally, the LES muscle closes after food passes through the esophagus to the stomach. When the LES is weakened or abnormal, it does not close properly, and that allows food and stomach acid to go back up into the esophagus. The LES can be weakened by certain dietary substances, medicines, and medical conditions, including: Tobacco use. Pregnancy. Having a hiatal hernia. Alcohol use. Certain foods and beverages, such as coffee, chocolate, onions, and peppermint. What increases the risk? You are more likely to develop this condition if you: Have an increased body weight. Have a connective tissue disorder. Take NSAIDs, such as ibuprofen. What are the signs or symptoms? Symptoms of this condition include: Heartburn. Difficult or painful swallowing and the feeling of having a lump in the throat. A bitter taste in the mouth. Bad breath and having a large amount of saliva. Having an upset or bloated stomach and belching. Chest pain. Different conditions can cause  chest pain. Make sure you see your health care provider if you experience chest pain. Shortness of breath or wheezing. Ongoing (chronic) cough or a nighttime cough. Wearing away of tooth  enamel. Weight loss. How is this diagnosed? This condition may be diagnosed based on a medical history and a physical exam. To determine if you have mild or severe GERD, your health care provider may also monitor how you respond to treatment. You may also have tests, including: A test to examine your stomach and esophagus with a small camera (endoscopy). A test that measures the acidity level in your esophagus. A test that measures how much pressure is on your esophagus. A barium swallow or modified barium swallow test to show the shape, size, and functioning of your esophagus. How is this treated? Treatment for this condition may vary depending on how severe your symptoms are. Your health care provider may recommend: Changes to your diet. Medicine. Surgery. The goal of treatment is to help relieve your symptoms and to prevent complications. Follow these instructions at home: Eating and drinking  Follow a diet as recommended by your health care provider. This may involve avoiding foods and drinks such as: Coffee and tea, with or without caffeine. Drinks that contain alcohol. Energy drinks and sports drinks. Carbonated drinks or sodas. Chocolate and cocoa. Peppermint and mint flavorings. Garlic and onions. Horseradish. Spicy and acidic foods, including peppers, chili powder, curry powder, vinegar, hot sauces, and barbecue sauce. Citrus fruit juices and citrus fruits, such as oranges, lemons, and limes. Tomato-based foods, such as red sauce, chili, salsa, and pizza with red sauce. Fried and fatty foods, such as donuts, french fries, potato chips, and high-fat dressings. High-fat meats, such as hot dogs and fatty cuts of red and white meats, such as rib eye steak, sausage, ham, and bacon. High-fat dairy items, such as whole milk, butter, and cream cheese. Eat small, frequent meals instead of large meals. Avoid drinking large amounts of liquid with your meals. Avoid eating meals during  the 2-3 hours before bedtime. Avoid lying down right after you eat. Do not exercise right after you eat. Lifestyle  Do not use any products that contain nicotine or tobacco. These products include cigarettes, chewing tobacco, and vaping devices, such as e-cigarettes. If you need help quitting, ask your health care provider. Try to reduce your stress by using methods such as yoga or meditation. If you need help reducing stress, ask your health care provider. If you are overweight, reduce your weight to an amount that is healthy for you. Ask your health care provider for guidance about a safe weight loss goal. General instructions Pay attention to any changes in your symptoms. Take over-the-counter and prescription medicines only as told by your health care provider. Do not take aspirin, ibuprofen, or other NSAIDs unless your health care provider told you to take these medicines. Wear loose-fitting clothing. Do not wear anything tight around your waist that causes pressure on your abdomen. Raise (elevate) the head of your bed about 6 inches (15 cm). You can use a wedge to do this. Avoid bending over if this makes your symptoms worse. Keep all follow-up visits. This is important. Contact a health care provider if: You have: New symptoms. Unexplained weight loss. Difficulty swallowing or it hurts to swallow. Wheezing or a persistent cough. A hoarse voice. Your symptoms do not improve with treatment. Get help right away if: You have sudden pain in your arms, neck, jaw, teeth, or back. You suddenly feel  sweaty, dizzy, or light-headed. You have chest pain or shortness of breath. You vomit and the vomit is green, yellow, or black, or it looks like blood or coffee grounds. You faint. You have stool that is red, bloody, or black. You cannot swallow, drink, or eat. These symptoms may represent a serious problem that is an emergency. Do not wait to see if the symptoms will go away. Get medical  help right away. Call your local emergency services (911 in the [place] Do not drive yourself to the hospital. Summary Gastroesophageal reflux happens when acid from the stomach flows up into the esophagus. GERD is a disease in which the reflux happens often, causes frequent or severe symptoms, or causes problems such as damage to the esophagus. Treatment for this condition may vary depending on how severe your symptoms are. Your health care provider may recommend diet and lifestyle changes, medicine, or surgery. Contact a health care provider if you have new or worsening symptoms. Take over-the-counter and prescription medicines only as told by your health care provider. Do not take aspirin, ibuprofen, or other NSAIDs unless your health care provider told you to do so. Keep all follow-up visits as told by your health care provider. This is important. This information is not intended to replace advice given to you by your health care provider. Make sure you discuss any questions you have with your health care provider. Document Revised: 08/18/2019 Document Reviewed: 08/18/2019 Elsevier Patient Education  [place].

## 2021-04-07 ENCOUNTER — Other Ambulatory Visit: Payer: Self-pay

## 2021-04-07 ENCOUNTER — Ambulatory Visit (HOSPITAL_COMMUNITY)
Admission: RE | Admit: 2021-04-07 | Discharge: 2021-04-07 | Disposition: A | Discharge status: Home / Self Care | Payer: Medicare Other | Source: Ambulatory Visit | Attending: Family Medicine | Admitting: Family Medicine

## 2021-04-07 DIAGNOSIS — I739 Peripheral vascular disease, unspecified: Secondary | ICD-10-CM | POA: Insufficient documentation

## 2021-04-07 NOTE — Progress Notes (Signed)
Attending Physician's Attestation   I have reviewed the chart.   I agree with the Advanced Practitioner's note, impression, and recommendations with any updates as below. Based on patient's age and progressive heartburn symptoms of dysphagia need to rule out underlying mass/malignancy as well as more typical findings of stricturing disease and esophagitis.  For patient's family history as well as personal history of colon polyps colonoscopy at same time for surveillance is reasonable.  Further work-up/imaging based on findings at time of procedures.   Justice Britain, MD [facility] Advanced Endoscopy Office # [phone]

## 2021-04-11 ENCOUNTER — Other Ambulatory Visit: Payer: Self-pay

## 2021-04-11 MED ORDER — ATORVASTATIN CALCIUM 40 MG PO TABS: 40.0000 mg | ORAL_TABLET | Freq: Every day | ORAL | 3 refills | Status: DC

## 2021-05-03 ENCOUNTER — Encounter: Payer: Self-pay | Admitting: Gastroenterology

## 2021-05-09 ENCOUNTER — Telehealth (HOSPITAL_BASED_OUTPATIENT_CLINIC_OR_DEPARTMENT_OTHER): Payer: Self-pay | Admitting: Cardiology

## 2021-05-09 ENCOUNTER — Encounter (HOSPITAL_BASED_OUTPATIENT_CLINIC_OR_DEPARTMENT_OTHER): Payer: Self-pay

## 2021-05-09 ENCOUNTER — Encounter: Payer: Self-pay | Admitting: Family Medicine

## 2021-05-09 NOTE — Telephone Encounter (Signed)
Patient needs a copy of her receipt from her last OV on 03/29/21 with Dr. Harrell Gave. Please advise if she needs to come pick up or if it is going to be mailed.  ?

## 2021-05-10 ENCOUNTER — Ambulatory Visit (AMBULATORY_SURGERY_CENTER): Payer: Medicare Other | Admitting: Gastroenterology

## 2021-05-10 ENCOUNTER — Encounter: Payer: Self-pay | Admitting: Gastroenterology

## 2021-05-10 ENCOUNTER — Other Ambulatory Visit: Payer: Self-pay

## 2021-05-10 VITALS — BP 128/77 | HR 80 | Temp 98.9°F | Resp 12 | Ht 63.0 in | Wt 145.0 lb

## 2021-05-10 DIAGNOSIS — K219 Gastro-esophageal reflux disease without esophagitis: Secondary | ICD-10-CM | POA: Diagnosis not present

## 2021-05-10 DIAGNOSIS — Z86010 Personal history of colonic polyps: Secondary | ICD-10-CM | POA: Diagnosis not present

## 2021-05-10 DIAGNOSIS — D122 Benign neoplasm of ascending colon: Secondary | ICD-10-CM | POA: Diagnosis not present

## 2021-05-10 DIAGNOSIS — K2960 Other gastritis without bleeding: Secondary | ICD-10-CM | POA: Diagnosis not present

## 2021-05-10 DIAGNOSIS — K2970 Gastritis, unspecified, without bleeding: Secondary | ICD-10-CM | POA: Diagnosis not present

## 2021-05-10 DIAGNOSIS — K229 Disease of esophagus, unspecified: Secondary | ICD-10-CM | POA: Diagnosis not present

## 2021-05-10 DIAGNOSIS — D124 Benign neoplasm of descending colon: Secondary | ICD-10-CM | POA: Diagnosis not present

## 2021-05-10 DIAGNOSIS — R1310 Dysphagia, unspecified: Secondary | ICD-10-CM | POA: Diagnosis not present

## 2021-05-10 MED ORDER — PANTOPRAZOLE SODIUM 40 MG PO TBEC: 40.0000 mg | DELAYED_RELEASE_TABLET | Freq: Two times a day (BID) | ORAL | 4 refills | Status: DC

## 2021-05-10 MED ORDER — SODIUM CHLORIDE 0.9 % IV SOLN: 500.0000 mL | Freq: Once | INTRAVENOUS | Status: DC

## 2021-05-10 NOTE — Progress Notes (Signed)
To pacu, VSS. Report to [email or web address] °

## 2021-05-10 NOTE — Patient Instructions (Addendum)
Handouts were given to your care partner on a hiatal hernia, esophageal dilatation diet to follow the rest of today, polyps, hemorrhoids, and a high fiber diet with liberal fluid intake. Since your esophagus was stretched, you may have a sore throat a few days.  Dr. Rush Landmark recommends to buy over the counter Cepacol or Halls Lozenges and or Chloraseptic spray for the next 72-96 hours to aid in sore throat should you experience this.  Increase your PANTOPRAZOLE 40 mg twice per day.  Before Breakfast and Dinner.  Best to take on an empty stomach. Use FIBERCON, over the counter fiber supplement 1-2 tablets daily. You may resume your other current medications today. Await biopsy results.  May take 1-3 weeks to receive pathology results. Please call if any questions or concerns.      YOU HAD AN ENDOSCOPIC PROCEDURE TODAY AT [facility]:   Refer to the procedure report that was given to you for any specific questions about what was found during the examination.  If the procedure report does not answer your questions, please call your gastroenterologist to clarify.  If you requested that your care partner not be given the details of your procedure findings, then the procedure report has been included in a sealed envelope for you to review at your convenience later.  YOU SHOULD EXPECT: Some feelings of bloating in the abdomen. Passage of more gas than usual.  Walking can help get rid of the air that was put into your GI tract during the procedure and reduce the bloating. If you had a lower endoscopy (such as a colonoscopy or flexible sigmoidoscopy) you may notice spotting of blood in your stool or on the toilet paper. If you underwent a bowel prep for your procedure, you may not have a normal bowel movement for a few days.  Please Note:  You might notice some irritation and congestion in your nose or some drainage.  This is from the oxygen used during your procedure.  There is no need for  concern and it should clear up in a day or so.  SYMPTOMS TO REPORT IMMEDIATELY:  Following lower endoscopy (colonoscopy or flexible sigmoidoscopy):  Excessive amounts of blood in the stool  Significant tenderness or worsening of abdominal pains  Swelling of the abdomen that is new, acute  Fever of 100F or higher  Following upper endoscopy (EGD)  Vomiting of blood or coffee ground material  New chest pain or pain under the shoulder blades  Painful or persistently difficult swallowing  New shortness of breath  Fever of 100F or higher  Black, tarry-looking stools  For urgent or emergent issues, a gastroenterologist can be reached at any hour by calling [phone]. Do not use MyChart messaging for urgent concerns.    DIET:  Please follow the esophageal dilatation diet handout given to your spouse.  Drink plenty of fluids but you should avoid alcoholic beverages for 24 hours.  ACTIVITY:  You should plan to take it easy for the rest of today and you should NOT DRIVE or use heavy machinery until tomorrow (because of the sedation medicines used during the test).    FOLLOW UP: Our staff will call the number listed on your records 48-72 hours following your procedure to check on you and address any questions or concerns that you may have regarding the information given to you following your procedure. If we do not reach you, we will leave a message.  We will attempt to reach you two  times.  During this call, we will ask if you have developed any symptoms of COVID 19. If you develop any symptoms (ie: fever, flu-like symptoms, shortness of breath, cough etc.) before then, please call [phone].  If you test positive for Covid 19 in the 2 weeks post procedure, please call and report this information to [place].    If any biopsies were taken you will be contacted by phone or by letter within the next 1-3 weeks.  Please call us at [phone] if you have not heard about the biopsies in 3 weeks.     SIGNATURES/CONFIDENTIALITY: You and/or your care partner have signed paperwork which will be entered into your electronic medical record.  These signatures attest to the fact that that the information above on your After Visit Summary has been reviewed and is understood.  Full responsibility of the confidentiality of this discharge information lies with you and/or your care-partner.

## 2021-05-10 NOTE — Progress Notes (Signed)
? ?GASTROENTEROLOGY PROCEDURE H&P NOTE  ? ?Primary Care Physician: ?Martin[place], Betty G, MD ? ?HPI: ?Nancy Chandler is a 66 y.o. female who presents for EGD/colonoscopy for evaluation of newer onset GERD symptoms as well as dysphagia and a personal as well as family history of colon polyps. ? ?Past Medical History:  ?Diagnosis Date  ? Abnormal Pap smear of cervix   ? neg HPV HR+ 16/18 neg 2017, 2018 LGSIL HPV-, 2019 ASCUS HPV HR+, 08-21-2018 LGSIL HPV HR-  ? Anxiety   ? Depression   ? Diabetes mellitus without complication ([place])   ? Dyspareunia   ? History of colon polyps   ? Hyperlipidemia   ? Hypertension   ? Lichen sclerosus   ? Thyroid disease   ? ?Past Surgical History:  ?Procedure Laterality Date  ? COLPOSCOPY  2010, 2018, 2019  ? History of LSIL, ASCUS + HPV  ? LYMPH NODE BIOPSY    ? TUBAL LIGATION  1990  ? ?Current Outpatient Medications  ?Medication Sig Dispense Refill  ? atorvastatin (LIPITOR) 40 MG tablet Take 1 tablet (40 mg total) by mouth daily. 90 tablet 3  ? clobetasol ointment (TEMOVATE) 0.05 % Apply bid for itching for up to 5 days.  Call office if itching is not relieved after that time. 60 g 0  ? EPINEPHrine (EPIPEN 2-PAK) 0.3 mg/0.3 mL IJ SOAJ injection Inject 0.3 mg into the muscle as needed for anaphylaxis. 1 each 1  ? fluticasone (FLONASE) 50 MCG/ACT nasal spray Place into the nose as needed.    ? hydrochlorothiazide (MICROZIDE) 12.5 MG capsule Take 1 capsule (12.5 mg total) by mouth daily. 90 capsule 3  ? ibuprofen (ADVIL) 600 MG tablet Take 1 tablet (600 mg total) by mouth every 6 (six) hours as needed. 30 tablet 0  ? Multiple Vitamin (MULTIVITAMIN) tablet Take 1 tablet by mouth daily.    ? pantoprazole (PROTONIX) 40 MG tablet Take 1 tablet (40 mg total) by mouth daily. 30 tablet 3  ? ?No current facility-administered medications for this visit.  ? ? ?Current Outpatient Medications:  ?  atorvastatin (LIPITOR) 40 MG tablet, Take 1 tablet (40 mg total) by mouth daily., Disp: 90 tablet, Rfl:  3 ?  clobetasol ointment (TEMOVATE) 0.05 %, Apply bid for itching for up to 5 days.  Call office if itching is not relieved after that time., Disp: 60 g, Rfl: 0 ?  EPINEPHrine (EPIPEN 2-PAK) 0.3 mg/0.3 mL IJ SOAJ injection, Inject 0.3 mg into the muscle as needed for anaphylaxis., Disp: 1 each, Rfl: 1 ?  fluticasone (FLONASE) 50 MCG/ACT nasal spray, Place into the nose as needed., Disp: , Rfl:  ?  hydrochlorothiazide (MICROZIDE) 12.5 MG capsule, Take 1 capsule (12.5 mg total) by mouth daily., Disp: 90 capsule, Rfl: 3 ?  ibuprofen (ADVIL) 600 MG tablet, Take 1 tablet (600 mg total) by mouth every 6 (six) hours as needed., Disp: 30 tablet, Rfl: 0 ?  Multiple Vitamin (MULTIVITAMIN) tablet, Take 1 tablet by mouth daily., Disp: , Rfl:  ?  pantoprazole (PROTONIX) 40 MG tablet, Take 1 tablet (40 mg total) by mouth daily., Disp: 30 tablet, Rfl: 3 ?Allergies  ?Allergen Reactions  ? Bee Venom Anaphylaxis  ?  Carries Epi pen   ? Hydrocodone Nausea And Vomiting  ? ?Family History  ?Problem Relation Age of Onset  ? Hypertension Brother   ? Hearing loss Brother   ? Hyperlipidemia Brother   ? Cancer Mother   ?     colon  ?  Hypertension Father   ? Alcohol abuse Father   ? Diabetes Father   ? Heart disease Maternal Grandmother   ?     CHF  ? Arthritis Maternal Grandmother   ? Cancer Maternal Grandfather   ?     kidney  ? Heart disease Paternal Grandmother   ?     CHF  ? Birth defects Daughter   ? Breast cancer Paternal Aunt   ? Heart disease Paternal Aunt   ?     open heart surgery  ? ?Social History  ? ?Socioeconomic History  ? Marital status: Married  ?  Spouse name: Not on file  ? Number of children: 2  ? Years of education: Not on file  ? Highest education level: Not on file  ?Occupational History  ? Not on file  ?Tobacco Use  ? Smoking status: Never  ? Smokeless tobacco: Never  ?Vaping Use  ? Vaping Use: Never used  ?Substance and Sexual Activity  ? Alcohol use: No  ?  Alcohol/week: 0.0 standard drinks  ? Drug use: No  ? Sexual  activity: Yes  ?  Partners: Male  ?  Birth control/protection: Surgical  ?  Comment: Tubal ligation  ?Other Topics Concern  ? Not on file  ?Social History Narrative  ? Not on file  ? ?Social Determinants of Health  ? ?Financial Resource Strain: Low Risk   ? Difficulty of Paying Living Expenses: Not hard at all  ?Food Insecurity: No Food Insecurity  ? Worried About Charity fundraiser in the Last Year: Never true  ? Ran Out of Food in the Last Year: Never true  ?Transportation Needs: No Transportation Needs  ? Lack of Transportation (Medical): No  ? Lack of Transportation (Non-Medical): No  ?Physical Activity: Sufficiently Active  ? Days of Exercise per Week: 5 days  ? Minutes of Exercise per Session: 60 min  ?Stress: No Stress Concern Present  ? Feeling of Stress : Not at all  ?Social Connections: Socially Integrated  ? Frequency of Communication with Friends and Family: Twice a week  ? Frequency of Social Gatherings with Friends and Family: Twice a week  ? Attends Religious Services: More than 4 times per year  ? Active Member of Clubs or Organizations: Yes  ? Attends Archivist Meetings: More than 4 times per year  ? Marital Status: Married  ?Intimate Partner Violence: Not At Risk  ? Fear of Current or Ex-Partner: No  ? Emotionally Abused: No  ? Physically Abused: No  ? Sexually Abused: No  ? ? ?Physical Exam: ?There were no vitals filed for this visit. ?There is no height or weight on file to calculate BMI. ?GEN: NAD ?EYE: Sclerae anicteric ?ENT: MMM ?CV: Non-tachycardic ?GI: Soft, NT/ND ?NEURO:  Alert & Oriented x 3 ? ?Lab Results: ?No results for input(s): WBC, HGB, HCT, PLT in the last 72 hours. ?BMET ?No results for input(s): NA, K, CL, CO2, GLUCOSE, BUN, CREATININE, CALCIUM in the last 72 hours. ?LFT ?No results for input(s): PROT, ALBUMIN, AST, ALT, ALKPHOS, BILITOT, BILIDIR, IBILI in the last 72 hours. ?PT/INR ?No results for input(s): LABPROT, INR in the last 72 hours. ? ? ?Impression /  Plan: ?This is a 66 y.o.female  who presents for EGD/colonoscopy for evaluation of newer onset GERD symptoms as well as dysphagia and a personal as well as family history of colon polyps. ? ?The risks and benefits of endoscopic evaluation/treatment were discussed with the patient and/or family; these  include but are not limited to the risk of perforation, infection, bleeding, missed lesions, lack of diagnosis, severe illness requiring hospitalization, as well as anesthesia and sedation related illnesses.  The patient's history has been reviewed, patient examined, no change in status, and deemed stable for procedure.  The patient and/or family is agreeable to proceed.  ? ? ?Justice Britain, MD ?[facility] ?Advanced Endoscopy ?Office # [phone] ? ?

## 2021-05-10 NOTE — Op Note (Signed)
[place] ?Patient Name: Nancy Chandler ?Procedure Date: 05/10/2021 1:15 PM ?MRN: [id number] ?Endoscopist: Justice Britain , MD ?Age: 66 ?Referring MD:  ?Date of Birth: 04-17-1955 ?Gender: Female ?Account #: [id number] ?Procedure:                Upper GI endoscopy ?Indications:              Dysphagia, Heartburn, Gastro-esophageal reflux  ?                          disease, Esophageal reflux symptoms that persist  ?                          despite appropriate therapy ?Medicines:                Monitored Anesthesia Care ?Procedure:                Pre-Anesthesia Assessment: ?                          - Prior to the procedure, a History and Physical  ?                          was performed, and patient medications and  ?                          allergies were reviewed. The patient's tolerance of  ?                          previous anesthesia was also reviewed. The risks  ?                          and benefits of the procedure and the sedation  ?                          options and risks were discussed with the patient.  ?                          All questions were answered, and informed consent  ?                          was obtained. Prior Anticoagulants: The patient has  ?                          taken no previous anticoagulant or antiplatelet  ?                          agents except for aspirin and has taken no previous  ?                          anticoagulant or antiplatelet agents except for  ?                          NSAID medication. ASA Grade Assessment: II - A  ?  patient with mild systemic disease. After reviewing  ?                          the risks and benefits, the patient was deemed in  ?                          satisfactory condition to undergo the procedure. ?                          After obtaining informed consent, the endoscope was  ?                          passed under direct vision. Throughout the  ?                          procedure, the  patient's blood pressure, pulse, and  ?                          oxygen saturations were monitored continuously. The  ?                          GIF HQ190 #[id number] was introduced through the  ?                          mouth, and advanced to the second part of duodenum.  ?                          The upper GI endoscopy was accomplished without  ?                          difficulty. The patient tolerated the procedure. ?Scope In: ?Scope Out: ?Findings:                 No gross lesions were noted in the entire  ?                          esophagus. Biopsies were taken with a cold forceps  ?                          for histology. A guidewire was placed and the scope  ?                          was withdrawn. Dilation was performed with a Savary  ?                          dilator with moderate resistance at 16 mm. The  ?                          dilation site was examined following endoscope  ?                          reinsertion and showed moderate mucosal disruption,  ?  moderate improvement in luminal narrowing and no  ?                          perforation. ?                          The Z-line was irregular and was found 35 cm from  ?                          the incisors. ?                          A 2 cm hiatal hernia was present. ?                          Patchy mildly erythematous mucosa without bleeding  ?                          was found in the entire examined stomach. Biopsies  ?                          were taken with a cold forceps for histology and  ?                          Helicobacter pylori testing. ?                          No gross lesions were noted in the duodenal bulb,  ?                          in the first portion of the duodenum and in the  ?                          second portion of the duodenum. ?Complications:            No immediate complications. ?Estimated Blood Loss:     Estimated blood loss was minimal. ?Impression:               - No gross lesions  in esophagus. Biopsied. Dilated  ?                          with evidence of a mucosal wrent presetn. ?                          - Z-line irregular, 35 cm from the incisors. ?                          - 2 cm hiatal hernia. ?                          - Erythematous mucosa in the stomach. Biopsied. ?                          - No gross lesions in the duodenal bulb, in the  ?  first portion of the duodenum and in the second  ?                          portion of the duodenum. ?Recommendation:           - Proceed to scheduled colonoscopy. ?                          - Dilation diet as per protocol. ?                          - Please use Cepacol or Halls Lozenges +/-  ?                          Chloraseptic spray for next 72-96 hours to aid in  ?                          sore thoat should you experience this. ?                          - Increase PPI to 40 mg twice daily. This will help  ?                          with healing of the mucosal wrent but also to see  ?                          if improvement or not in regards to breakthrough  ?                          GERD symptoms. ?                          - If dypshagia symptoms recur, can consider repeat  ?                          EGD with Savary dilation up to 18 mm if necessary. ?                          - The findings and recommendations were discussed  ?                          with the patient. ?                          - The findings and recommendations were discussed  ?                          with the patient's family. ?Justice Britain, MD ?05/10/2021 2:33:56 PM ?

## 2021-05-10 NOTE — Op Note (Signed)
[name] ?Patient Name: Nancy Chandler ?Procedure Date: 05/10/2021 1:14 PM ?MRN: [id number] ?Endoscopist: Justice Britain , MD ?Age: 66 ?Referring MD:  ?Date of Birth: [birth year] ?Gender: Female ?Account #: [id number] ?Procedure:                Colonoscopy ?Indications:              High risk colon cancer surveillance: Personal  ?                          history of colonic polyps ?Medicines:                Monitored Anesthesia Care ?Procedure:                Pre-Anesthesia Assessment: ?                          - Prior to the procedure, a History and Physical  ?                          was performed, and patient medications and  ?                          allergies were reviewed. The patient's tolerance of  ?                          previous anesthesia was also reviewed. The risks  ?                          and benefits of the procedure and the sedation  ?                          options and risks were discussed with the patient.  ?                          All questions were answered, and informed consent  ?                          was obtained. Prior Anticoagulants: The patient has  ?                          taken no previous anticoagulant or antiplatelet  ?                          agents except for aspirin and has taken no previous  ?                          anticoagulant or antiplatelet agents except for  ?                          NSAID medication. ASA Grade Assessment: II - A  ?                          patient with mild systemic disease. After reviewing  ?  the risks and benefits, the patient was deemed in  ?                          satisfactory condition to undergo the procedure. ?                          After obtaining informed consent, the colonoscope  ?                          was passed under direct vision. Throughout the  ?                          procedure, the patient's blood pressure, pulse, and  ?                          oxygen saturations were  monitored continuously. The  ?                          [place] #[id number] was introduced through  ?                          the anus and advanced to the 5 cm into the ileum.  ?                          The colonoscopy was performed without difficulty.  ?                          The patient tolerated the procedure. The quality of  ?                          the bowel preparation was adequate. The terminal  ?                          ileum, ileocecal valve, appendiceal orifice, and  ?                          rectum were photographed. ?Scope In: 2:07:38 PM ?Scope Out: 2:21:59 PM ?Scope Withdrawal Time: 0 hours 11 minutes 2 seconds  ?Total Procedure Duration: 0 hours 14 minutes 21 seconds  ?Findings:                 The digital rectal exam was normal. Pertinent  ?                          negatives include no palpable rectal lesions. ?                          The colon (entire examined portion) revealed  ?                          moderately excessive looping. ?                          Two sessile polyps were found in the descending  ?  colon and ascending colon. The polyps were 3 to 5  ?                          mm in size. These polyps were removed with a cold  ?                          snare. Resection and retrieval were complete. ?                          Normal mucosa was found in the entire colon  ?                          otherwise. ?                          Non-bleeding non-thrombosed internal hemorrhoids  ?                          were found during retroflexion, during perianal  ?                          exam and during digital exam. The hemorrhoids were  ?                          Grade I (internal hemorrhoids that do not prolapse). ?Complications:            No immediate complications. ?Estimated Blood Loss:     Estimated blood loss was minimal. ?Impression:               - There was significant looping of the colon. ?                          - Two 3 to 5 mm  polyps in the descending colon and  ?                          in the ascending colon, removed with a cold snare.  ?                          Resected and retrieved. ?                          - Normal mucosa in the entire examined colon  ?                          otherwise. ?                          - Non-bleeding non-thrombosed internal hemorrhoids. ?Recommendation:           - The patient will be observed post-procedure,  ?                          until all discharge criteria are met. ?                          -  Discharge patient to home. ?                          - Patient has a contact number available for  ?                          emergencies. The signs and symptoms of potential  ?                          delayed complications were discussed with the  ?                          patient. Return to normal activities tomorrow.  ?                          Written discharge instructions were provided to the  ?                          patient. ?                          - High fiber diet. ?                          - Use FiberCon 1-2 tablets PO daily. ?                          - Continue present medications. ?                          - Await pathology results. ?                          - Repeat colonoscopy in 5/7 years for surveillance  ?                          based on pathology results and prior history of  ?                          colon polyps. ?                          - Consideration of  ?                          Linzess/Trulance/Motegrity/Amitiza use in future,  ?                          if constipation issues remain. ?                          - The findings and recommendations were discussed  ?                          with the patient. ?                          -  The findings and recommendations were discussed  ?                          with the patient's family. ?Justice Britain, MD ?05/10/2021 2:37:51 PM ?

## 2021-05-12 ENCOUNTER — Telehealth: Payer: Self-pay | Admitting: *Deleted

## 2021-05-12 NOTE — Telephone Encounter (Signed)
?  Follow up Call- ? ? ?  05/10/2021  ?  1:32 PM  ?Call back number  ?Post procedure Call Back phone  # [phone]  ?Permission to leave phone message Yes  ?  ? ?Patient questions: ? ?Do you have a fever, pain , or abdominal swelling? No. ?Pain Score  0 * ? ?Have you tolerated food without any problems? Yes.   ? ?Have you been able to return to your normal activities? Yes.   ? ?Do you have any questions about your discharge instructions: ?Diet   No. ?Medications  No. ?Follow up visit  No. ? ?Do you have questions or concerns about your Care? No. ? ?Actions: ?* If pain score is 4 or above: ?No action needed, pain <4. ? ? ?

## 2021-05-13 ENCOUNTER — Encounter: Payer: Self-pay | Admitting: Gastroenterology

## 2021-05-13 ENCOUNTER — Telehealth: Payer: Self-pay | Admitting: Gastroenterology

## 2021-05-13 NOTE — Telephone Encounter (Signed)
The pt called to report some throat soreness and "feeling of something in my throat" sine EGD on 3/21. She is using cepacal lozenges and says the discomfort is getting better.  She wants to make sure this is nothing to be concerned about. She was advised to continue the cepacal and call back on Monday if she does not continue to improve.   ? ?FYI Dr Rush Landmark  ?

## 2021-05-13 NOTE — Telephone Encounter (Signed)
Agree with plan of action since she is doing better. ?Thanks. ?GM ?

## 2021-05-13 NOTE — Telephone Encounter (Signed)
Inbound call from patient had egd 3/21. States when she swallows she feels as if something is there ?

## 2021-05-18 ENCOUNTER — Encounter: Payer: Self-pay | Admitting: Gastroenterology

## 2021-05-19 ENCOUNTER — Other Ambulatory Visit: Payer: Self-pay | Admitting: Family Medicine

## 2021-05-19 DIAGNOSIS — I10 Essential (primary) hypertension: Secondary | ICD-10-CM

## 2021-05-25 ENCOUNTER — Encounter: Payer: Self-pay | Admitting: Podiatry

## 2021-05-25 ENCOUNTER — Ambulatory Visit: Payer: Medicare Other | Admitting: Podiatry

## 2021-05-25 DIAGNOSIS — B351 Tinea unguium: Secondary | ICD-10-CM

## 2021-05-25 MED ORDER — TERBINAFINE HCL 250 MG PO TABS: ORAL_TABLET | ORAL | 0 refills | Status: DC

## 2021-05-25 NOTE — Progress Notes (Signed)
Subjective:  ? ?Patient ID: Nancy Chandler, female   DOB: 66 y.o.   MRN: [id number]  ? ?HPI ?Patient presents with brittleness around the left great toenail and concerned about increase in the discoloration pattern ? ? ?ROS ? ? ?   ?Objective:  ?Physical Exam  ?Neurovascular status intact with discoloration around the left hallux nail lateral side measuring about two thirds of the nailbed ? ?   ?Assessment:  ?Increasing mycotic infection of the left hallux nail over the period of time from last year ? ?   ?Plan:  ?H&P reviewed condition and I have recommended a pulse Lamisil treatment to try to eradicate fungus that is most likely present.  Educated her on this and she is placed on pulse Lamisil with all education given to her today ?   ? ? ?

## 2021-06-02 ENCOUNTER — Other Ambulatory Visit: Payer: Self-pay | Admitting: Family Medicine

## 2021-06-02 DIAGNOSIS — K219 Gastro-esophageal reflux disease without esophagitis: Secondary | ICD-10-CM

## 2021-06-23 ENCOUNTER — Ambulatory Visit (HOSPITAL_BASED_OUTPATIENT_CLINIC_OR_DEPARTMENT_OTHER): Payer: Medicare Other | Admitting: Obstetrics & Gynecology

## 2021-06-23 ENCOUNTER — Other Ambulatory Visit (HOSPITAL_COMMUNITY)
Admission: RE | Admit: 2021-06-23 | Discharge: 2021-06-23 | Disposition: A | Discharge status: Home / Self Care | Payer: Medicare Other | Source: Ambulatory Visit | Attending: Obstetrics & Gynecology | Admitting: Obstetrics & Gynecology

## 2021-06-23 ENCOUNTER — Encounter (HOSPITAL_BASED_OUTPATIENT_CLINIC_OR_DEPARTMENT_OTHER): Payer: Self-pay | Admitting: Obstetrics & Gynecology

## 2021-06-23 VITALS — BP 150/72 | HR 67 | Ht 63.0 in | Wt 144.6 lb

## 2021-06-23 DIAGNOSIS — Z01419 Encounter for gynecological examination (general) (routine) without abnormal findings: Secondary | ICD-10-CM | POA: Insufficient documentation

## 2021-06-23 DIAGNOSIS — Z1151 Encounter for screening for human papillomavirus (HPV): Secondary | ICD-10-CM | POA: Diagnosis not present

## 2021-06-23 DIAGNOSIS — Z9189 Other specified personal risk factors, not elsewhere classified: Secondary | ICD-10-CM

## 2021-06-23 DIAGNOSIS — Z9889 Other specified postprocedural states: Secondary | ICD-10-CM

## 2021-06-23 DIAGNOSIS — N870 Mild cervical dysplasia: Secondary | ICD-10-CM

## 2021-06-23 DIAGNOSIS — L900 Lichen sclerosus et atrophicus: Secondary | ICD-10-CM

## 2021-06-23 MED ORDER — CLOBETASOL PROPIONATE 0.05 % EX OINT: TOPICAL_OINTMENT | CUTANEOUS | 2 refills | Status: AC

## 2021-06-23 NOTE — Progress Notes (Signed)
66 y.o. G2P2002 Married Nancy Chandler or [place] American female here for breast and pelvic exam and for follow up with hx of LGSIL, LEEP in 09/2018.  Follow up pap smears after this has been normal.  Denies vaginal bleeding.  Denies vaginal bleeding ? ?Has hx of lichen sclerosus.  Has occasional itching.  Has figured out certain soaps and toilet papers worsen her symptoms.  She has made changes.  When has itching, only uses steroid for a day or two.  Does need RF.   ? ?Colonoscopy:  05/10/2021.  Polyps noted.  Follow up 5 years. ?MMG:  02/03/2021 Negative ?BMD:  09/11/2018 ?Last pap smear:  06/07/2020 Negative.   ?H/o abnormal pap smear: yes with +HR HPV ? ? reports that she has never smoked. She has never used smokeless tobacco. She reports that she does not drink alcohol and does not use drugs. ? ?Past Medical History:  ?Diagnosis Date  ? Abnormal Pap smear of cervix   ? neg HPV HR+ 16/18 neg 2017, 2018 LGSIL HPV-, 2019 ASCUS HPV HR+, 08-21-2018 LGSIL HPV HR-  ? Anxiety   ? Depression   ? Diabetes mellitus without complication ([place])   ? Dyspareunia   ? History of colon polyps   ? Hyperlipidemia   ? Hypertension   ? Lichen sclerosus   ? Thyroid disease   ? ? ?Past Surgical History:  ?Procedure Laterality Date  ? COLPOSCOPY  2010, 2018, 2019  ? History of LSIL, ASCUS + HPV  ? LYMPH NODE BIOPSY    ? TUBAL LIGATION  1990  ? ? ?Current Outpatient Medications  ?Medication Sig Dispense Refill  ? aspirin 81 MG EC tablet     ? atorvastatin (LIPITOR) 40 MG tablet Take 1 tablet (40 mg total) by mouth daily. 90 tablet 3  ? clobetasol ointment (TEMOVATE) 0.05 % Apply bid for itching for up to 5 days.  Call office if itching is not relieved after that time. 60 g 0  ? EPINEPHrine (EPIPEN 2-PAK) 0.3 mg/0.3 mL IJ SOAJ injection Inject 0.3 mg into the muscle as needed for anaphylaxis. 1 each 1  ? fluticasone (FLONASE) 50 MCG/ACT nasal spray Place into the nose as needed.    ? hydrochlorothiazide (MICROZIDE) 12.5 MG capsule TAKE 1 CAPSULE BY  MOUTH EVERY DAY 90 capsule 3  ? ibuprofen (ADVIL) 600 MG tablet Take 1 tablet (600 mg total) by mouth every 6 (six) hours as needed. 30 tablet 0  ? Multiple Vitamin (MULTIVITAMIN) tablet Take 1 tablet by mouth daily.    ? pantoprazole (PROTONIX) 40 MG tablet Take 1 tablet (40 mg total) by mouth 2 (two) times daily. Take before breakfast and before dinner.  Best to take on an empty stomach. 60 tablet 4  ? ?No current facility-administered medications for this visit.  ? ? ?Family History  ?Problem Relation Age of Onset  ? Hypertension Brother   ? Hearing loss Brother   ? Hyperlipidemia Brother   ? Cancer Mother   ?     colon  ? Hypertension Father   ? Alcohol abuse Father   ? Diabetes Father   ? Heart disease Maternal Grandmother   ?     CHF  ? Arthritis Maternal Grandmother   ? Cancer Maternal Grandfather   ?     kidney  ? Heart disease Paternal Grandmother   ?     CHF  ? Birth defects Daughter   ? Breast cancer Paternal Aunt   ? Heart disease Paternal Aunt   ?  open heart surgery  ? ? ?Review of Systems  ?Genitourinary: Negative.   ? ?Exam:   ?BP (!) 150/72 (BP Location: Right Arm, Patient Position: Sitting, Cuff Size: Large)   Pulse 67   Ht '5\' 3"'$  (1.6 m) Comment: reported  Wt 144 lb 9.6 oz (65.6 kg)   LMP 02/21/2008   BMI 25.61 kg/m?   Height: '5\' 3"'$  (160 cm) (reported) ? ?General appearance: alert, cooperative and appears stated age ?Breasts: normal appearance, no masses or tenderness ?Abdomen: soft, non-tender; bowel sounds normal; no masses,  no organomegaly ?Lymph nodes: Cervical, supraclavicular, and axillary nodes normal.  No abnormal inguinal nodes palpated ?Neurologic: Grossly normal ? ?Pelvic: External genitalia:  hypopigmentation of inner labia majora in kissing pattern, no tissue thickening or ulcerations ?             Urethra:  normal appearing urethra with no masses, tenderness or lesions ?             Bartholins and Skenes: normal    ?             Vagina: normal appearing vagina with atrophic  changes and no discharge, no lesions ?             Cervix: no lesions ?             Pap taken: Yes.   ?Bimanual Exam:  Uterus:  normal size, contour, position, consistency, mobility, non-tender ?             Adnexa: normal adnexa and no mass, fullness, tenderness ?              Rectovaginal: Confirms ?              Anus:  normal sphincter tone, no lesions ? ?Chaperone, Octaviano Batty, CMA, was present for exam. ? ?Assessment/Plan: ?1. Encounter for gynecologic examination for high-risk patient covered by Medicare ?- pap and HR HPV obtained today ?- MMG up to date ?- colonoscopy don e3/2023 ?- lab work done with Dr. Martinique ?- vaccines reviewed ? ?2. Dysplasia of cervix, low grade (CIN 1) ?- Cytology - PAP( [facility]) ? ?3. Lichen sclerosus et atrophicus ?- clobetasol ointment (TEMOVATE) 0.05 %; Apply bid for itching for up to 5 days.  Call office if itching is not relieved after that time.  Dispense: 30 g; Refill: 2 ? ?4. History of loop electrical excision procedure (LEEP) ?- done 2020 ? ? ? ? ?

## 2021-06-25 ENCOUNTER — Other Ambulatory Visit: Payer: Self-pay | Admitting: Family Medicine

## 2021-06-27 LAB — CYTOLOGY - PAP
Adequacy: ABSENT
Comment: NEGATIVE
High risk HPV: NEGATIVE

## 2021-07-17 ENCOUNTER — Other Ambulatory Visit: Payer: Self-pay | Admitting: Gastroenterology

## 2021-07-17 DIAGNOSIS — K219 Gastro-esophageal reflux disease without esophagitis: Secondary | ICD-10-CM

## 2021-09-01 ENCOUNTER — Ambulatory Visit (INDEPENDENT_AMBULATORY_CARE_PROVIDER_SITE_OTHER): Payer: Medicare Other

## 2021-09-01 VITALS — Ht 63.0 in | Wt 141.0 lb

## 2021-09-01 DIAGNOSIS — Z0000 Encounter for general adult medical examination without abnormal findings: Secondary | ICD-10-CM

## 2021-09-01 NOTE — Patient Instructions (Addendum)
Nancy Chandler , Thank you for taking time to come for your Medicare Wellness Visit. I appreciate your ongoing commitment to your health goals. Please review the following plan we discussed and let me know if I can assist you in the future.   These are the goals we discussed:  Goals       Cut out extra servings      Stay healthy (pt-stated)      Continue working        This is a list of the screening recommended for you and due dates:  Health Maintenance  Topic Date Due   Urine Protein Check  10/27/2021   COVID-19 Vaccine (5 - Moderna series) 09/17/2021*   Pneumonia Vaccine (2 - PCV) 01/11/2022*   Flu Shot  09/20/2021   Eye exam for diabetics  09/20/2021   Hemoglobin A1C  09/25/2021   Complete foot exam   10/27/2021   Mammogram  02/03/2022   Tetanus Vaccine  12/09/2023   Colon Cancer Screening  05/10/2028   DEXA scan (bone density measurement)  Completed   Hepatitis C Screening: USPSTF Recommendation to screen - Ages 31-79 yo.  Completed   Zoster (Shingles) Vaccine  Completed   HPV Vaccine  Aged Out  *Topic was postponed. The date shown is not the original due date.    Advanced directives: No  Conditions/risks identified: None  Next appointment: Follow up in one year for your annual wellness visit     Preventive Care 65 Years and Older, Female Preventive care refers to lifestyle choices and visits with your health care provider that can promote health and wellness. What does preventive care include? A yearly physical exam. This is also called an annual well check. Dental exams once or twice a year. Routine eye exams. Ask your health care provider how often you should have your eyes checked. Personal lifestyle choices, including: Daily care of your teeth and gums. Regular physical activity. Eating a healthy diet. Avoiding tobacco and drug use. Limiting alcohol use. Practicing safe sex. Taking low-dose aspirin every day. Taking vitamin and mineral supplements as  recommended by your health care provider. What happens during an annual well check? The services and screenings done by your health care provider during your annual well check will depend on your age, overall health, lifestyle risk factors, and family history of disease. Counseling  Your health care provider may ask you questions about your: Alcohol use. Tobacco use. Drug use. Emotional well-being. Home and relationship well-being. Sexual activity. Eating habits. History of falls. Memory and ability to understand (cognition). Work and work Statistician. Reproductive health. Screening  You may have the following tests or measurements: Height, weight, and BMI. Blood pressure. Lipid and cholesterol levels. These may be checked every 5 years, or more frequently if you are over 27 years old. Skin check. Lung cancer screening. You may have this screening every year starting at age 25 if you have a 30-pack-year history of smoking and currently smoke or have quit within the past 15 years. Fecal occult blood test (FOBT) of the stool. You may have this test every year starting at age 47. Flexible sigmoidoscopy or colonoscopy. You may have a sigmoidoscopy every 5 years or a colonoscopy every 10 years starting at age 59. Hepatitis C blood test. Hepatitis B blood test. Sexually transmitted disease (STD) testing. Diabetes screening. This is done by checking your blood sugar (glucose) after you have not eaten for a while (fasting). You may have this done every 1-3 years.  Bone density scan. This is done to screen for osteoporosis. You may have this done starting at age 29. Mammogram. This may be done every 1-2 years. Talk to your health care provider about how often you should have regular mammograms. Talk with your health care provider about your test results, treatment options, and if necessary, the need for more tests. Vaccines  Your health care provider may recommend certain vaccines, such  as: Influenza vaccine. This is recommended every year. Tetanus, diphtheria, and acellular pertussis (Tdap, Td) vaccine. You may need a Td booster every 10 years. Zoster vaccine. You may need this after age 51. Pneumococcal 13-valent conjugate (PCV13) vaccine. One dose is recommended after age 83. Pneumococcal polysaccharide (PPSV23) vaccine. One dose is recommended after age 34. Talk to your health care provider about which screenings and vaccines you need and how often you need them. This information is not intended to replace advice given to you by your health care provider. Make sure you discuss any questions you have with your health care provider. Document Released: 03/05/2015 Document Revised: 10/27/2015 Document Reviewed: 12/08/2014 Elsevier Interactive Patient Education  2017 [place] Prevention in the Home Falls can cause injuries. They can happen to people of all ages. There are many things you can do to make your home safe and to help prevent falls. What can I do on the outside of my home? Regularly fix the edges of walkways and driveways and fix any cracks. Remove anything that might make you trip as you walk through a door, such as a raised step or threshold. Trim any bushes or trees on the path to your home. Use bright outdoor lighting. Clear any walking paths of anything that might make someone trip, such as rocks or tools. Regularly check to see if handrails are loose or broken. Make sure that both sides of any steps have handrails. Any raised decks and porches should have guardrails on the edges. Have any leaves, snow, or ice cleared regularly. Use sand or salt on walking paths during winter. Clean up any spills in your garage right away. This includes oil or grease spills. What can I do in the bathroom? Use night lights. Install grab bars by the toilet and in the tub and shower. Do not use towel bars as grab bars. Use non-skid mats or decals in the tub or  shower. If you need to sit down in the shower, use a plastic, non-slip stool. Keep the floor dry. Clean up any water that spills on the floor as soon as it happens. Remove soap buildup in the tub or shower regularly. Attach bath mats securely with double-sided non-slip rug tape. Do not have throw rugs and other things on the floor that can make you trip. What can I do in the bedroom? Use night lights. Make sure that you have a light by your bed that is easy to reach. Do not use any sheets or blankets that are too big for your bed. They should not hang down onto the floor. Have a firm chair that has side arms. You can use this for support while you get dressed. Do not have throw rugs and other things on the floor that can make you trip. What can I do in the kitchen? Clean up any spills right away. Avoid walking on wet floors. Keep items that you use a lot in easy-to-reach places. If you need to reach something above you, use a strong step stool that has a grab bar.  Keep electrical cords out of the way. Do not use floor polish or wax that makes floors slippery. If you must use wax, use non-skid floor wax. Do not have throw rugs and other things on the floor that can make you trip. What can I do with my stairs? Do not leave any items on the stairs. Make sure that there are handrails on both sides of the stairs and use them. Fix handrails that are broken or loose. Make sure that handrails are as long as the stairways. Check any carpeting to make sure that it is firmly attached to the stairs. Fix any carpet that is loose or worn. Avoid having throw rugs at the top or bottom of the stairs. If you do have throw rugs, attach them to the floor with carpet tape. Make sure that you have a light switch at the top of the stairs and the bottom of the stairs. If you do not have them, ask someone to add them for you. What else can I do to help prevent falls? Wear shoes that: Do not have high heels. Have  rubber bottoms. Are comfortable and fit you well. Are closed at the toe. Do not wear sandals. If you use a stepladder: Make sure that it is fully opened. Do not climb a closed stepladder. Make sure that both sides of the stepladder are locked into place. Ask someone to hold it for you, if possible. Clearly mark and make sure that you can see: Any grab bars or handrails. First and last steps. Where the edge of each step is. Use tools that help you move around (mobility aids) if they are needed. These include: Canes. Walkers. Scooters. Crutches. Turn on the lights when you go into a dark area. Replace any light bulbs as soon as they burn out. Set up your furniture so you have a clear path. Avoid moving your furniture around. If any of your floors are uneven, fix them. If there are any pets around you, be aware of where they are. Review your medicines with your doctor. Some medicines can make you feel dizzy. This can increase your chance of falling. Ask your doctor what other things that you can do to help prevent falls. This information is not intended to replace advice given to you by your health care provider. Make sure you discuss any questions you have with your health care provider. Document Released: 12/03/2008 Document Revised: 07/15/2015 Document Reviewed: 03/13/2014 Elsevier Interactive Patient Education  2017 [employer].

## 2021-09-01 NOTE — Progress Notes (Signed)
Subjective:   Nancy Chandler is a 66 y.o. female who presents for Medicare Annual (Subsequent) preventive examination.  Review of Systems    Virtual Visit via Telephone Note  I connected with  [name] on 09/01/21 at 10:45 AM EDT by telephone and verified that I am speaking with the correct person using two identifiers.  Location: Patient: Home Provider: Office Persons participating in the virtual visit: patient/Nurse Health Advisor   I discussed the limitations, risks, security and privacy concerns of performing an evaluation and management service by telephone and the availability of in person appointments. The patient expressed understanding and agreed to proceed.  Interactive audio and video telecommunications were attempted between this nurse and patient, however failed, due to patient having technical difficulties OR patient did not have access to video capability.  We continued and completed visit with audio only.  Some vital signs may be absent or patient reported.   Nancy Peaches, LPN  Cardiac Risk Factors include: advanced age (>61mn, >>57women);hypertension     Objective:    Today's Vitals   09/01/21 1058  Weight: 141 lb (64 kg)  Height: '5\' 3"'$  (1.6 m)   Body mass index is 24.98 kg/m.     09/01/2021   11:07 AM 08/30/2020    8:35 AM 03/16/2016    2:45 PM 02/08/2016    4:40 PM 11/02/2015    4:43 PM  Advanced Directives  Does Patient Have a Medical Advance Directive? No Yes No Yes Yes  Type of ACorporate [place]f [place]  Does patient want to make changes to medical advance directive?     Yes - information given  Copy of HDel [place] Chart?  No - copy requested  No - copy requested No - copy requested  Would patient like information on creating a medical advance directive? No - Patient declined  Yes (Inpatient - patient requests chaplain consult to create a  medical advance directive)      Current Medications (verified) Outpatient Encounter Medications as of 09/01/2021  Medication Sig   aspirin 81 MG EC tablet    atorvastatin (LIPITOR) 40 MG tablet Take 1 tablet (40 mg total) by mouth daily.   clobetasol ointment (TEMOVATE) 0.05 % Apply bid for itching for up to 5 days.  Call office if itching is not relieved after that time.   EPINEPHrine (EPIPEN 2-PAK) 0.3 mg/0.3 mL IJ SOAJ injection Inject 0.3 mg into the muscle as needed for anaphylaxis.   fluticasone (FLONASE) 50 MCG/ACT nasal spray Place into the nose as needed.   hydrochlorothiazide (MICROZIDE) 12.5 MG capsule TAKE 1 CAPSULE BY MOUTH EVERY DAY   ibuprofen (ADVIL) 600 MG tablet Take 1 tablet (600 mg total) by mouth every 6 (six) hours as needed.   Multiple Vitamin (MULTIVITAMIN) tablet Take 1 tablet by mouth daily.   pantoprazole (PROTONIX) 40 MG tablet TAKE 1 TABLET BY MOUTH 2 TIMES DAILY BEFORE BREAKFAST AND BEFORE DINNER. TAKE ON AN EMPTY STOMACH   No facility-administered encounter medications on file as of 09/01/2021.    Allergies (verified) Bee venom and Hydrocodone   History: Past Medical History:  Diagnosis Date   Abnormal Pap smear of cervix    neg HPV HR+ 16/18 neg 2017, 2018 LGSIL HPV-, 2019 ASCUS HPV HR+, 08-21-2018 LGSIL HPV HR-   Anxiety    Depression    Di[place] complication [place]    Dyspareunia  History of colon polyps    Hyperlipidemia    Hypertension    Lichen sclerosus    Thyroid disease    Past Surgical History:  Procedure Laterality Date   COLPOSCOPY  2010, 2018, 2019   History of LSIL, ASCUS + HPV   LYMPH NODE BIOPSY     TUBAL LIGATION  1990   Family History  Problem Relation Age of Onset   Hypertension Brother    Hearing loss Brother    Hyperlipidemia Brother    Cancer Mother        colon   Hypertension Father    Alcohol abuse Father    Diabetes Father    Heart disease Maternal Grandmother        CHF   Arthritis Maternal  Grandmother    Cancer Maternal Grandfather        kidney   Heart disease Paternal Grandmother        CHF   Birth defects Daughter    Breast cancer Paternal Aunt    Heart disease Paternal Aunt        open heart surgery   Social History   Socioeconomic History   Marital status: Married    Spouse name: Not on file   Number of children: 2   Years of education: Not on file   Highest education level: Not on file  Occupational History   Not on file  Tobacco Use   Smoking status: Never   Smokeless tobacco: Never  Vaping Use   Vaping Use: Never used  Substance and Sexual Activity   Alcohol use: No    Alcohol/week: 0.0 standard drinks of alcohol   Drug use: No   Sexual activity: Yes    Partners: Male    Birth control/protection: Surgical    Comment: Tubal ligation  Other Topics Concern   Not on file  Social History Narrative   Not on file   Social Determinants of Health   Financial Resource Strain: Low Risk  (09/01/2021)   Overall Financial Resource Strain (CARDIA)    Difficulty of Paying Living Expenses: Not hard at all  Food Insecurity: No Food Insecurity (09/01/2021)   Hunger Vital Sign    Worried About Running Out of Food in the Last Year: Never true    Ran Out of Food in the Last Year: Never true  Transportation Needs: No Transportation Needs (09/01/2021)   PRAPARE - Hydrologist (Medical): No    Lack of Transportation (Non-Medical): No  Physical Activity: Insufficiently Active (09/01/2021)   Exercise Vital Sign    Days of Exercise per Week: 2 days    Minutes of Exercise per Session: 30 min  Stress: Stress Concern Present (09/01/2021)   [place]    Feeling of Stress : To some extent  Social Connections: Socially Integrated (09/01/2021)   Social Connection and Isolation Panel [NHANES]    Frequency of Communication with Friends and Family: More than three times a week     Frequency of Social Gatherings with Friends and Family: Once a week    Attends Religious Services: More than 4 times per year    Active Member of [employer] or Organizations: Yes    Attends Archivist Meetings: Never    Marital Status: Married    Tobacco Counseling Counseling given: Not Answered   Clinical Intake:  Pre-visit preparation completed: Yes  Pain : No/denies pain     BMI - recorded:  25.62 Nutritional Status: BMI 25 -29 Overweight Nutritional Risks: None Diabetes: No  How often do you need to have someone help you when you read instructions, pamphlets, or other written materials from your doctor or pharmacy?: 1 - Never  Diabetic?  No  Activities of Daily Living    09/01/2021   11:04 AM 08/30/2021    9:36 PM  In your present state of health, do you have any difficulty performing the following activities:  Hearing? 0 0  Vision? 0 0  Difficulty concentrating or making decisions? 0 0  Walking or climbing stairs? 0 0  Dressing or bathing? 0 0  Doing errands, shopping? 0 0  Preparing Food and eating ? N N  Using the Toilet? N N  In the past six months, have you accidently leaked urine? N N  Do you have problems with loss of bowel control? N N  Managing your Medications? N N  Managing your Finances? N N  Housekeeping or managing your Housekeeping? N N    Patient Care Team: Martinique, Betty G, MD as PCP - General (Family Medicine) Buford Dresser, MD as PCP - Cardiology (Cardiology)  Indicate any recent Medical Services you may have received from other than [facility] providers in the past year (date may be approximate).     Assessment:   This is a routine wellness examination for Shuntel.  Hearing/Vision screen Hearing Screening - Comments:: No hearing difficulty Vision Screening - Comments:: Wears glasses. Followed by [place] issues and exercise activities discussed: Exercise limited by: None identified   Goals Addressed                This Visit's Progress     Stay healthy (pt-stated)        Continue working       Depression Screen    09/01/2021   11:02 AM 06/23/2021    8:20 AM 03/07/2021    2:24 PM 01/11/2021    2:59 PM 08/30/2020    8:38 AM 06/07/2020    1:16 PM 02/25/2018    8:04 AM  PHQ 2/9 Scores  PHQ - 2 Score 0 0 0 0 0 0 0  PHQ- 9 Score   1 4       Fall Risk    09/01/2021   11:06 AM 08/30/2021    9:36 PM 03/07/2021    2:24 PM 01/11/2021    2:59 PM 08/30/2020    8:38 AM  Fall Risk   Falls in the past year? 0 0 1 1 0  Number falls in past yr: 0 0 0  0  Injury with Fall? 0 0 0 0 0  Risk for fall due to : No Fall Risks      Follow up     Falls evaluation completed    FALL RISK PREVENTION PERTAINING TO THE HOME:  Any stairs in or around the home? Yes  If so, are there any without handrails? No  Home free of loose throw rugs in walkways, pet beds, electrical cords, etc? Yes  Adequate lighting in your home to reduce risk of falls? Yes   ASSISTIVE DEVICES UTILIZED TO PREVENT FALLS:  Life alert? No  Use of a cane, walker or w/c? No  Grab bars in the bathroom? No  Shower chair or bench in shower? No  Elevated toilet seat or a handicapped toilet? Yes   TIMED UP AND GO:  Was the test performed? No . Audio Visit  Cognitive Function:  09/01/2021   11:07 AM  6CIT Screen  What Year? 0 points  What month? 0 points  What time? 0 points  Count back from 20 0 points  Months in reverse 0 points  Repeat phrase 0 points  Total Score 0 points    Immunizations Immunization History  Administered Date(s) Administered   Fluad Quad(high Dose 65+) 10/27/2020   Influenza,inj,Quad PF,6+ Mos 03/14/2017, 02/25/2018, 12/05/2018   Influenza-Unspecified 12/04/2013, 11/21/2015   Moderna Sars-Covid-2 Vaccination 04/12/2019, 05/13/2019, 12/30/2019, 07/27/2020   Pneumococcal Polysaccharide-23 03/16/2016   Tdap 12/08/2013   Zoster Recombinat (Shingrix) 10/20/2019, 07/27/2020    TDAP status:  Up to date  Flu Vaccine status: Up to date   Covid-19 vaccine status: Completed vaccines  Qualifies for Shingles Vaccine? Yes   Zostavax completed Yes   Shingrix Completed?: Yes  Screening Tests Health Maintenance  Topic Date Due   URINE MICROALBUMIN  10/27/2021   COVID-19 Vaccine (5 - Moderna series) 09/17/2021 (Originally 09/21/2020)   Pneumonia Vaccine 82+ Years old (2 - PCV) 01/11/2022 (Originally 08/23/2019)   INFLUENZA VACCINE  09/20/2021   OPHTHALMOLOGY EXAM  09/20/2021   HEMOGLOBIN A1C  09/25/2021   FOOT EXAM  10/27/2021   MAMMOGRAM  02/03/2022   TETANUS/TDAP  12/09/2023   COLONOSCOPY (Pts 45-58yr Insurance coverage will need to be confirmed)  05/10/2028   DEXA SCAN  Completed   Hepatitis C Screening  Completed   Zoster Vaccines- Shingrix  Completed   HPV VACCINES  Aged Out    Health Maintenance  Health Maintenance Due  Topic Date Due   URINE MICROALBUMIN  10/27/2021    Colorectal cancer screening: Type of screening: Colonoscopy. Completed 05/10/21. Repeat every 7 years  Mammogram status: Completed 02/03/21. Repeat every year  Bone Density status: Completed 09/11/18. Results reflect: Bone density results: OSTEOPOROSIS. Repeat every   years.  Lung Cancer Screening: (Low Dose CT Chest recommended if Age 67-80years, 30 pack-year currently smoking OR have quit w/in 15years.) does not qualify.  Additional Screening:  Hepatitis C Screening: does qualify; Completed 09/28/14  Vision Screening: Recommended annual ophthalmology exams for early detection of glaucoma and other disorders of the eye. Is the patient up to date with their annual eye exam?  Yes  Who is the provider or what is the name of the office in which the patient attends annual eye exams? [place] is not established with a provider, would they like to be referred to a provider to establish care? No .   Dental Screening: Recommended annual dental exams for proper oral hygiene  Community  Resource Referral / Chronic Care Management:  CRR required this visit?  No   CCM required this visit?  No      Plan:     I have personally reviewed and noted the following in the patient's chart:   Medical and social history Use of alcohol, tobacco or illicit drugs  Current medications and supplements including opioid prescriptions.  Functional ability and status Nutritional status Physical activity Advanced directives List of other physicians Hospitalizations, surgeries, and ER visits in previous 12 months Vitals Screenings to include cognitive, depression, and falls Referrals and appointments  In addition, I have reviewed and discussed with patient certain preventive protocols, quality metrics, and best practice recommendations. A written personalized care plan for preventive services as well as general preventive health recommendations were provided to patient.     BCriselda Peaches LPN   [id number]  Nurse Notes: None

## 2021-09-05 NOTE — Progress Notes (Unsigned)
ACUTE VISIT Chief Complaint  Patient presents with   Extremity Weakness    Right leg weakness with toe tingling, started on Sunday night.    HPI: Nancy Chandler is a 66 y.o. female with hx of PAD,GERD, DM II, anxiety,HTN,and OA here today complaining of RLE weakness and right toes tingling, great toe,2nd,and 3rd one. Anterior thigh achy pain, which she has had before and ascribed to bursitis. No associated back pain.  Extremity Weakness  This is a new problem. The current episode started in the past 7 days. There has been no history of extremity trauma. The problem occurs constantly. The problem has been gradually improving. Associated symptoms include tingling. Pertinent negatives include no fever, inability to bear weight, itching, joint locking, joint swelling, limited range of motion or stiffness. She has tried nothing for the symptoms. Family history does not include gout. Her past medical history is significant for diabetes and osteoarthritis.  She has not identified exacerbating or alleviating factors. Negative for saddle anesthesia or bladder/bowel dysfunction. She has not tried OTC medication.  Lumbar X ray done on 01/11/21 because lower back pain radiated to RLE showed mild degenerative changes.  DM 2 on nonpharmacologic treatment has been adequately controlled. Lab Results  Component Value Date   HGBA1C 6.0 03/28/2021   Review of Systems  Constitutional:  Negative for activity change, appetite change, chills, fever and unexpected weight change.  Respiratory:  Negative for cough, shortness of breath and wheezing.   Cardiovascular:  Negative for chest pain and leg swelling.  Gastrointestinal:  Negative for abdominal pain, nausea and vomiting.  Genitourinary:  Negative for decreased urine volume, dysuria and hematuria.  Musculoskeletal:  Positive for extremity weakness. Negative for stiffness.  Skin:  Negative for itching and rash.  Neurological:  Positive for  tingling. Negative for syncope and facial asymmetry.  Rest see pertinent positives and negatives per HPI.  Current Outpatient Medications on File Prior to Visit  Medication Sig Dispense Refill   aspirin 81 MG EC tablet      atorvastatin (LIPITOR) 40 MG tablet Take 1 tablet (40 mg total) by mouth daily. 90 tablet 3   clobetasol ointment (TEMOVATE) 0.05 % Apply bid for itching for up to 5 days.  Call office if itching is not relieved after that time. 30 g 2   EPINEPHrine (EPIPEN 2-PAK) 0.3 mg/0.3 mL IJ SOAJ injection Inject 0.3 mg into the muscle as needed for anaphylaxis. 1 each 1   fluticasone (FLONASE) 50 MCG/ACT nasal spray Place into the nose as needed.     hydrochlorothiazide (MICROZIDE) 12.5 MG capsule TAKE 1 CAPSULE BY MOUTH EVERY DAY 90 capsule 3   ibuprofen (ADVIL) 600 MG tablet Take 1 tablet (600 mg total) by mouth every 6 (six) hours as needed. 30 tablet 0   Multiple Vitamin (MULTIVITAMIN) tablet Take 1 tablet by mouth daily.     pantoprazole (PROTONIX) 40 MG tablet TAKE 1 TABLET BY MOUTH 2 TIMES DAILY BEFORE BREAKFAST AND BEFORE DINNER. TAKE ON AN EMPTY STOMACH 180 tablet 1   No current facility-administered medications on file prior to visit.   Past Medical History:  Diagnosis Date   Abnormal Pap smear of cervix    neg HPV HR+ 16/18 neg 2017, 2018 LGSIL HPV-, 2019 ASCUS HPV HR+, 08-21-2018 LGSIL HPV HR-   Anxiety    Depression    Diabetes mellitus without complication ([place])    Dyspareunia    History of colon polyps    Hyperlipidemia    Hypertension  Lichen sclerosus    Thyroid disease    Allergies  Allergen Reactions   Bee Venom Anaphylaxis    Carries Epi pen    Hydrocodone Nausea And Vomiting    Social History   Socioeconomic History   Marital status: Married    Spouse name: Not on file   Number of children: 2   Years of education: Not on file   Highest education level: Not on file  Occupational History   Not on file  Tobacco Use   Smoking status: Never    Smokeless tobacco: Never  Vaping Use   Vaping Use: Never used  Substance and Sexual Activity   Alcohol use: No    Alcohol/week: 0.0 standard drinks of alcohol   Drug use: No   Sexual activity: Yes    Partners: Male    Birth control/protection: Surgical    Comment: Tubal ligation  Other Topics Concern   Not on file  Social History Narrative   Not on file   Social Determinants of Health   Financial Resource Strain: Low Risk  (09/01/2021)   Overall Financial Resource Strain (CARDIA)    Difficulty of Paying Living Expenses: Not hard at all  Food Insecurity: No Food Insecurity (09/01/2021)   Hunger Vital Sign    Worried About Running Out of Food in the Last Year: Never true    Ran Out of Food in the Last Year: Never true  Transportation Needs: No Transportation Needs (09/01/2021)   PRAPARE - Hydrologist (Medical): No    Lack of Transportation (Non-Medical): No  Physical Activity: Insufficiently Active (09/01/2021)   Exercise Vital Sign    Days of Exercise per Week: 2 days    Minutes of Exercise per Session: 30 min  Stress: Stress Concern Present (09/01/2021)   [place]    Feeling of Stress : To some extent  Social Connections: Socially Integrated (09/01/2021)   Social Connection and Isolation Panel [NHANES]    Frequency of Communication with Friends and Family: More than three times a week    Frequency of Social Gatherings with Friends and Family: Once a week    Attends Religious Services: More than 4 times per year    Active Member of [employer] or Organizations: Yes    Attends Archivist Meetings: Never    Marital Status: Married   Vitals:   09/06/21 0656  BP: 120/70  Pulse: 66  Resp: 12  SpO2: 99%   Body mass index is 25.24 kg/m.  Physical Exam Vitals and nursing note reviewed.  Constitutional:      General: She is not in acute distress.    Appearance: She is  well-developed. She is not ill-appearing.  HENT:     Head: Normocephalic and atraumatic.  Eyes:     Conjunctiva/sclera: Conjunctivae normal.  Cardiovascular:     Rate and Rhythm: Normal rate and regular rhythm.     Heart sounds: No murmur heard. Pulmonary:     Effort: Pulmonary effort is normal. No respiratory distress.     Breath sounds: Normal breath sounds.  Abdominal:     Palpations: Abdomen is soft.     Tenderness: There is no abdominal tenderness.  Musculoskeletal:     Lumbar back: No tenderness or bony tenderness. Negative right straight leg raise test and negative left straight leg raise test.     Comments: No significant deformity appreciated. No local edema or erythema appreciated, no suspicious  lesions.  Skin:    General: Skin is warm.     Findings: No erythema or rash.  Neurological:     General: No focal deficit present.     Mental Status: She is alert and oriented to person, place, and time.     Deep Tendon Reflexes:     Reflex Scores:      Patellar reflexes are 2+ on the right side and 2+ on the left side.    Comments: Normal gait. Able to walk on tip toes and heels with no limitation or pain.  Psychiatric:        Mood and Affect: Mood is anxious.     Comments: Well groomed, good eye contact.   ASSESSMENT AND PLAN:  Nancy Chandler was seen today for extremity weakness.  Diagnoses and all orders for this visit: Orders Placed This Encounter  Procedures   MR Lumbar Spine Wo Contrast   Weakness of right lower extremity Examination today negative for neurologic deficit. I do not think blood work is needed at this time. She was clearly instructed about warning signs. Lumbar MRI will be arranged.  Lumbar radiculopathy, right She was having radicular leg symptoms in 12/2020 and now presenting with right lower extremity weakness and new onset of tingling sensation, so I thinks lumbar MRI is warranted. After discussion of some side effects, she agrees with prednisone  taper. Instructed about warning signs. Further recommendation will be given according to lumbar MRI.  -     predniSONE (DELTASONE) 20 MG tablet; 2 tabs for 4 days, 1 tabs for 3 days, and 1/2 tab for 3 days. Take tables together with breakfast.  I spent a total of 32 minutes in both face to face and non face to face activities for this visit on the date of this encounter. During this time history was obtained and documented, examination was performed, prior labs/imaging reviewed, and assessment/plan discussed.  Return if symptoms worsen or fail to improve, for Keep next appt.  Carmen Tolliver G. Martinique, MD  [facility]. [place] office.

## 2021-09-06 ENCOUNTER — Ambulatory Visit (INDEPENDENT_AMBULATORY_CARE_PROVIDER_SITE_OTHER): Payer: Medicare Other | Admitting: Family Medicine

## 2021-09-06 ENCOUNTER — Encounter: Payer: Self-pay | Admitting: Family Medicine

## 2021-09-06 VITALS — BP 120/70 | HR 66 | Resp 12 | Ht 63.0 in | Wt 142.5 lb

## 2021-09-06 DIAGNOSIS — M5416 Radiculopathy, lumbar region: Secondary | ICD-10-CM

## 2021-09-06 DIAGNOSIS — R29898 Other symptoms and signs involving the musculoskeletal system: Secondary | ICD-10-CM

## 2021-09-06 MED ORDER — PREDNISONE 20 MG PO TABS: ORAL_TABLET | ORAL | 0 refills | Status: AC

## 2021-09-06 NOTE — Patient Instructions (Signed)
A few things to remember from today's visit:  Weakness of right lower extremity - Plan: MR Lumbar Spine Wo Contrast  Lumbar radiculopathy, right - Plan: MR Lumbar Spine Wo Contrast, predniSONE (DELTASONE) 20 MG tablet  If you need refills please call your pharmacy. Do not use My Chart to request refills or for acute issues that need immediate attention.   Examination today negative in general. We will arrange MRI of lower back. Take Prednisone with breakfast.  Please be sure medication list is accurate. If a new problem present, please set up appointment sooner than planned today.  Lumbosacral Radiculopathy Lumbosacral radiculopathy is a condition that involves the spinal nerves and nerve roots in the low back and bottom of the spine. The condition develops when these nerves and nerve roots move out of place or become inflamed and cause symptoms. What are the causes? This condition may be caused by: Pressure from a disk that bulges out of place (herniated disk). A disk is a plate of soft cartilage that separates bones in the spine. Disk changes that occur with age (disk degeneration). A narrowing of the bones of the lower back (spinal stenosis). A tumor. An infection. An injury that places sudden pressure on the disks that cushion the bones of your lower spine. What increases the risk? You are more likely to develop this condition if: You are a female who is 18-39 years old. You are a female who is 45-44 years old. You use improper technique when lifting things. You are overweight or live a sedentary lifestyle. You smoke. Your work requires frequent lifting. You do repetitive activities that strain the spine. What are the signs or symptoms? Symptoms of this condition include: Pain that goes down from your back into your legs (sciatica), usually on one side of the body. This is the most common symptom. The pain may be worse when you sit, cough, or sneeze. Tingling and numbness in  your legs. Muscle weakness in your legs. Loss of bladder control or bowel control. How is this diagnosed? This condition may be diagnosed based on: Your symptoms and medical history. A physical exam. If the pain lasts, you may have tests, such as: MRI. X-ray. CT scan. A type of CT scan used to examine the spinal canal after injecting a dye into your spine (myelogram). A test to measure how electrical impulses move through a nerve (nerve conduction study). A test to measure the electrical activity in muscles (electromyogram). How is this treated? In many cases, treatment is not needed for this condition. With rest, the condition usually gets better over time. If treatment is needed, it may include: Working with a physical therapist to improve strength and flexibility. Taking pain medicine. Applying heat or ice or both to the affected areas. Having chiropractic spinal manipulation. Using transcutaneous electrical nerve stimulation (TENS) therapy. Getting a steroid injection in the spine. Having surgery. This may be needed if other treatments do not help. Different types of surgery may be done depending on the cause of this condition. Follow these instructions at home: Activity Avoid bending and any other activities that make the problem worse. Maintain a proper position when standing or sitting. When standing, keep your upper back and neck straight with your shoulders pulled back. Avoid slouching. When sitting, keep your back straight and relax your shoulders. Do not round your shoulders or pull them backward. Do not sit or stand in one place for long periods of time. Take brief periods of rest throughout the day.  This will reduce your pain. It is usually better to rest by lying down or standing, not sitting. Mix in mild activity or stretching between long periods of rest. This will help to prevent stiffness and pain. Get regular exercise. Ask your health care provider what activities  are safe for you. If you were shown how to do any exercises or stretches, do them as told by your health care provider. You may have to avoid lifting. Ask your health care provider how much you can safely lift. Always use proper lifting technique, which includes: Bending your knees. Keeping the load close to your body. Avoiding twisting. Managing pain If directed, put ice on the affected area. To do this: Put ice in a plastic bag. Place a towel between your skin and the bag. Leave the ice on for 20 minutes, 2-3 times a day. Remove the ice if your skin turns bright red. This is very important. If you cannot feel pain, heat, or cold, you have a greater risk of damage to the area. If directed, apply heat to the affected area as often as told by your health care provider. Use the heat source that your health care provider recommends, such as a moist heat pack or a heating pad. Place a towel between your skin and the heat source. Leave the heat on for 20-30 minutes. Remove the heat if your skin turns bright red. This is especially important if you are unable to feel pain, heat, or cold. You have a greater risk of getting burned. Take over-the-counter and prescription medicines only as told by your health care provider. General instructions Sleep on a firm mattress in a comfortable position. Try lying on your side with your knees slightly bent. If you lie on your back, put a pillow under your knees. Ask your health care provider if the medicine prescribed to you requires you to avoid driving or using machinery. If your health care provider prescribed a diet or exercise program, follow it as told. Keep all follow-up visits. This is important. Contact a health care provider if: Your pain does not get better over time, even when taking pain medicines. Get help right away if: You develop severe pain. Your pain suddenly gets worse. You develop increasing weakness in your legs. You lose the ability  to control your bladder or bowel. You have difficulty walking or balancing. You have a fever. Summary Lumbosacral radiculopathy is a condition that occurs when the spinal nerves and nerve roots in the lower part of the spine move out of place or become inflamed and cause symptoms. Symptoms include pain, numbness, and tingling that go down from your back into your legs (sciatica), muscle weakness, and loss of bladder control or bowel control. If directed, apply ice or heat or both to the affected area as told by your health care provider. Follow instructions about activity, rest, and proper lifting technique. This information is not intended to replace advice given to you by your health care provider. Make sure you discuss any questions you have with your health care provider. Document Revised: 08/12/2020 Document Reviewed: 08/12/2020 Elsevier Patient Education  [place].

## 2021-09-14 ENCOUNTER — Encounter: Payer: Self-pay | Admitting: Family Medicine

## 2021-09-16 ENCOUNTER — Telehealth: Payer: Self-pay | Admitting: Family Medicine

## 2021-09-16 NOTE — Telephone Encounter (Signed)
Pt is sch for MRI on 09-20-2021 and she would like some medication to take for her nerves  [id number] [place]:  [phone] Fax:  [phone]

## 2021-09-19 ENCOUNTER — Other Ambulatory Visit: Payer: Self-pay | Admitting: Family Medicine

## 2021-09-19 MED ORDER — DIAZEPAM 5 MG PO TABS: ORAL_TABLET | ORAL | 0 refills | Status: AC

## 2021-09-19 NOTE — Telephone Encounter (Signed)
See my chart encounter.

## 2021-09-20 ENCOUNTER — Other Ambulatory Visit: Payer: Medicare Other

## 2021-09-25 ENCOUNTER — Ambulatory Visit
Admission: RE | Admit: 2021-09-25 | Discharge: 2021-09-25 | Disposition: A | Discharge status: Home / Self Care | Payer: Medicare Other | Source: Ambulatory Visit | Attending: Family Medicine | Admitting: Family Medicine

## 2021-09-25 DIAGNOSIS — M5416 Radiculopathy, lumbar region: Secondary | ICD-10-CM | POA: Diagnosis not present

## 2021-09-25 DIAGNOSIS — M48061 Spinal stenosis, lumbar region without neurogenic claudication: Secondary | ICD-10-CM | POA: Diagnosis not present

## 2021-09-25 DIAGNOSIS — R29898 Other symptoms and signs involving the musculoskeletal system: Secondary | ICD-10-CM

## 2021-09-25 DIAGNOSIS — M545 Low back pain, unspecified: Secondary | ICD-10-CM | POA: Diagnosis not present

## 2021-09-25 DIAGNOSIS — R202 Paresthesia of skin: Secondary | ICD-10-CM | POA: Diagnosis not present

## 2021-11-07 NOTE — Progress Notes (Unsigned)
HPI: Nancy Chandler is a 66 y.o. female, who is here today for chronic disease management.  Last seen on 03/28/21  Hyperlipidemia: Currently on Atorvastatin 40 mg daily. Side effects from medication: none Lab Results  Component Value Date   CHOL 176 10/27/2020   HDL 63.70 10/27/2020   LDLCALC 99 10/27/2020   TRIG 67.0 10/27/2020   CHOLHDL 3 10/27/2020   Diabetes Mellitus II: Dx'ed in 2016 with HgA1C 6.5. - Checking BG at home: Not checking. - Medications: none - Diet: Trying to avoid sweets, decreased carbs intake and increased water. - Exercise: Not consistently. Started working past time and it involves some walking. - eye exam: Due - foot exam: Due - Negative for symptoms of hypoglycemia, polyuria, polydipsia, numbness extremities, foot ulcers/trauma  Lab Results  Component Value Date   HGBA1C 6.0 03/28/2021   Lab Results  Component Value Date   MICROALBUR <0.7 10/27/2020   Hypertension:  Medications: HCTZ 12.5 mg daily. Side effects: None Negative for unusual or severe headache, visual changes, exertional chest pain, dyspnea,  focal weakness, or edema. Lab Results  Component Value Date   CREATININE 0.84 10/27/2020   BUN 11 10/27/2020   NA 142 10/27/2020   K 3.9 10/27/2020   CL 105 10/27/2020   CO2 31 10/27/2020   PAD: Negative for claudication like symptoms. RLE pain, chronic. Heavy like sensation, not frequent and usually at rest.   ABI 04/07/21: Right: Resting right ankle-brachial index is within normal range. No  evidence of significant right lower extremity arterial disease. The right toe-brachial index is normal.  Left: Resting left ankle-brachial index is within normal range. No evidence of significant left lower extremity arterial disease. The left toe-braRight: Resting right ankle-brachial index is within normal range. No evidence of significant right lower extremity arterial disease. The right toe-brachial index is normal.  Left: Resting  left ankle-brachial index is within normal range. No evidence of significant left lower extremity arterial disease. The left toe-brachial index is abnormal. chial index is abnormal.   Review of Systems  Constitutional:  Negative for activity change, appetite change and fever.  HENT:  Negative for mouth sores and nosebleeds.   Respiratory:  Negative for cough and wheezing.   Gastrointestinal:  Negative for abdominal pain, nausea and vomiting.       Negative for changes in bowel habits.  Genitourinary:  Negative for decreased urine volume, dysuria and hematuria.  Skin:  Negative for rash.  Neurological:  Negative for syncope and facial asymmetry.  Rest of ROS see pertinent positives and negatives in HPI.  Current Outpatient Medications on File Prior to Visit  Medication Sig Dispense Refill   aspirin 81 MG EC tablet      atorvastatin (LIPITOR) 40 MG tablet Take 1 tablet (40 mg total) by mouth daily. 90 tablet 3   clobetasol ointment (TEMOVATE) 0.05 % Apply bid for itching for up to 5 days.  Call office if itching is not relieved after that time. 30 g 2   EPINEPHrine (EPIPEN 2-PAK) 0.3 mg/0.3 mL IJ SOAJ injection Inject 0.3 mg into the muscle as needed for anaphylaxis. 1 each 1   fluticasone (FLONASE) 50 MCG/ACT nasal spray Place into the nose as needed.     hydrochlorothiazide (MICROZIDE) 12.5 MG capsule TAKE 1 CAPSULE BY MOUTH EVERY DAY 90 capsule 3   ibuprofen (ADVIL) 600 MG tablet Take 1 tablet (600 mg total) by mouth every 6 (six) hours as needed. 30 tablet 0   Multiple Vitamin (MULTIVITAMIN)  tablet Take 1 tablet by mouth daily.     pantoprazole (PROTONIX) 40 MG tablet TAKE 1 TABLET BY MOUTH 2 TIMES DAILY BEFORE BREAKFAST AND BEFORE DINNER. TAKE ON AN EMPTY STOMACH 180 tablet 1   No current facility-administered medications on file prior to visit.   Past Medical History:  Diagnosis Date   Abnormal Pap smear of cervix    neg HPV HR+ 16/18 neg 2017, 2018 LGSIL HPV-, 2019 ASCUS HPV HR+,  08-21-2018 LGSIL HPV HR-   Anxiety    Depression    Diabetes mellitus without complication (HCC)    Dyspareunia    History of colon polyps    Hyperlipidemia    Hypertension    Lichen sclerosus    Thyroid disease    Allergies  Allergen Reactions   Bee Venom Anaphylaxis    Carries Epi pen    Hydrocodone Nausea And Vomiting   Social History   Socioeconomic History   Marital status: Married    Spouse name: Not on file   Number of children: 2   Years of education: Not on file   Highest education level: Some college, no degree  Occupational History   Not on file  Tobacco Use   Smoking status: Never   Smokeless tobacco: Never  Vaping Use   Vaping Use: Never used  Substance and Sexual Activity   Alcohol use: No    Alcohol/week: 0.0 standard drinks of alcohol   Drug use: No   Sexual activity: Yes    Partners: Male    Birth control/protection: Surgical    Comment: Tubal ligation  Other Topics Concern   Not on file  Social History Narrative   Not on file   Social Determinants of Health   Financial Resource Strain: Low Risk  (11/04/2021)   Overall Financial Resource Strain (CARDIA)    Difficulty of Paying Living Expenses: Not hard at all  Food Insecurity: No Food Insecurity (11/04/2021)   Hunger Vital Sign    Worried About Running Out of Food in the Last Year: Never true    Ran Out of Food in the Last Year: Never true  Transportation Needs: No Transportation Needs (11/04/2021)   PRAPARE - Hydrologist (Medical): No    Lack of Transportation (Non-Medical): No  Physical Activity: Insufficiently Active (11/04/2021)   Exercise Vital Sign    Days of Exercise per Week: 2 days    Minutes of Exercise per Session: 30 min  Stress: Stress Concern Present (11/04/2021)   [place]    Feeling of Stress : To some extent  Social Connections: Socially Integrated (11/04/2021)   Social Connection  and Isolation Panel [NHANES]    Frequency of Communication with Friends and Family: More than three times a week    Frequency of Social Gatherings with Friends and Family: More than three times a week    Attends Religious Services: More than 4 times per year    Active Member of [employer] or Organizations: Yes    Attends Archivist Meetings: More than 4 times per year    Marital Status: Married   Vitals:   11/08/21 0725  BP: 124/80  Pulse: 79  Resp: 12  SpO2: 99%  Body mass index is 25.33 kg/m.  Physical Exam Vitals and nursing note reviewed.  Constitutional:      General: She is not in acute distress.    Appearance: She is well-developed and well-groomed.  HENT:  Head: Normocephalic and atraumatic.     Mouth/Throat:     Mouth: Mucous membranes are moist.     Pharynx: Oropharynx is clear.  Eyes:     Conjunctiva/sclera: Conjunctivae normal.  Cardiovascular:     Rate and Rhythm: Normal rate and regular rhythm.     Pulses:          Dorsalis pedis pulses are 2+ on the right side and 2+ on the left side.     Heart sounds: No murmur heard. Pulmonary:     Effort: Pulmonary effort is normal. No respiratory distress.     Breath sounds: Normal breath sounds.  Abdominal:     Palpations: Abdomen is soft. There is no hepatomegaly or mass.     Tenderness: There is no abdominal tenderness.  Skin:    General: Skin is warm.     Findings: No erythema or rash.  Neurological:     General: No focal deficit present.     Mental Status: She is alert and oriented to person, place, and time.     Cranial Nerves: No cranial nerve deficit.     Gait: Gait normal.  Psychiatric:        Mood and Affect: Mood and affect normal.    Diabetic Foot Exam - Simple   Simple Foot Form Diabetic Foot exam was performed with the following findings: Yes 11/08/2021  7:39 AM  Visual Inspection No deformities, no ulcerations, no other skin breakdown bilaterally: Yes Sensation Testing Intact to touch  and monofilament testing bilaterally: Yes Pulse Check Posterior Tibialis and Dorsalis pulse intact bilaterally: Yes Comments   ASSESSMENT AND PLAN:  Ms. Ringold was here today for follow up.  Diagnoses and all orders for this visit: Orders Placed This Encounter  Procedures   Flu Vaccine QUAD High Dose(Fluad)   Comprehensive metabolic panel   Hemoglobin A1c   Microalbumin / creatinine urine ratio   Lipid panel   Lab Results  Component Value Date   CREATININE 0.88 11/08/2021   BUN 12 11/08/2021   NA 142 11/08/2021   K 3.8 11/08/2021   CL 104 11/08/2021   CO2 32 11/08/2021   Lab Results  Component Value Date   ALT 12 11/08/2021   AST 15 11/08/2021   ALKPHOS 88 11/08/2021   BILITOT 0.4 11/08/2021   Lab Results  Component Value Date   CHOL 157 11/08/2021   HDL 58.50 11/08/2021   LDLCALC 86 11/08/2021   TRIG 63.0 11/08/2021   CHOLHDL 3 11/08/2021   Lab Results  Component Value Date   MICROALBUR <0.7 11/08/2021   MICROALBUR <0.7 10/27/2020   Lab Results  Component Value Date   HGBA1C 6.6 (H) 11/08/2021   Type 2 diabetes mellitus with other specified complication ([place]) MGN0I has been at goal. Continue non pharmacologic treatment. Regular exercise and healthy diet with avoidance of added sugar food intake is an important part of treatment and encouraged. Annual eye exam (appt in 11/2021), periodic dental and foot care recommended. F/U in 5-6 months   Hyperlipidemia LDL goal < 70. Continue Atorvastatin 40 mg daily and low fat diet. Further recommendations according to lipid panel result (not fasting).  Essential hypertension BP adequately controlled. Continue HCTZ 25 mg daily. Low salt/DASH diet to continue. Eye exam appt on 12/01/21.  PAD (peripheral artery disease) ([place]) She is not having claudication like symptoms. Continue Atorvastatin and Aspirin 81 mg daily. Will plan on repeating ABI in 03/2022.  Need for influenza vaccination -  Flu Vaccine  QUAD High Dose(Fluad)  Return in about 6 months (around 05/09/2022).  Aylen Stradford G. [place], MD  [facility]. [place] office.

## 2021-11-08 ENCOUNTER — Ambulatory Visit (INDEPENDENT_AMBULATORY_CARE_PROVIDER_SITE_OTHER): Payer: Medicare Other | Admitting: Family Medicine

## 2021-11-08 VITALS — BP 124/80 | HR 79 | Resp 12 | Ht 63.0 in | Wt 143.0 lb

## 2021-11-08 DIAGNOSIS — E785 Hyperlipidemia, unspecified: Secondary | ICD-10-CM

## 2021-11-08 DIAGNOSIS — E1169 Type 2 diabetes mellitus with other specified complication: Secondary | ICD-10-CM | POA: Diagnosis not present

## 2021-11-08 DIAGNOSIS — I10 Essential (primary) hypertension: Secondary | ICD-10-CM

## 2021-11-08 DIAGNOSIS — I739 Peripheral vascular disease, unspecified: Secondary | ICD-10-CM

## 2021-11-08 DIAGNOSIS — Z23 Encounter for immunization: Secondary | ICD-10-CM | POA: Diagnosis not present

## 2021-11-08 LAB — COMPREHENSIVE METABOLIC PANEL
ALT: 12 U/L (ref 0–35)
AST: 15 U/L (ref 0–37)
Albumin: 3.6 g/dL (ref 3.5–5.2)
Alkaline Phosphatase: 88 U/L (ref 39–117)
BUN: 12 mg/dL (ref 6–23)
CO2: 32 mEq/L (ref 19–32)
Calcium: 9.2 mg/dL (ref 8.4–10.5)
Chloride: 104 mEq/L (ref 96–112)
Creatinine, Ser: 0.88 mg/dL (ref 0.40–1.20)
GFR: 68.1 mL/min (ref >60.00)
Glucose, Bld: 113 mg/dL — ABNORMAL HIGH (ref 70–99)
Potassium: 3.8 mEq/L (ref 3.5–5.1)
Sodium: 142 mEq/L (ref 135–145)
Total Bilirubin: 0.4 mg/dL (ref 0.2–1.2)
Total Protein: 7.2 g/dL (ref 6.0–8.3)

## 2021-11-08 LAB — MICROALBUMIN / CREATININE URINE RATIO
Creatinine,U: 78.2 mg/dL
Microalb Creat Ratio: 0.9 mg/g (ref 0.0–30.0)
Microalb, Ur: <0.7 mg/dL (ref 0.0–1.9)

## 2021-11-08 LAB — LIPID PANEL
Cholesterol: 157 mg/dL (ref 0–200)
HDL: 58.5 mg/dL (ref >39.00)
LDL Cholesterol: 86 mg/dL (ref 0–99)
NonHDL: 98.78
Total CHOL/HDL Ratio: 3
Triglycerides: 63 mg/dL (ref 0.0–149.0)
VLDL: 12.6 mg/dL (ref 0.0–40.0)

## 2021-11-08 LAB — HEMOGLOBIN A1C: Hgb A1c MFr Bld: 6.6 % — ABNORMAL HIGH (ref 4.6–6.5)

## 2021-11-08 NOTE — Assessment & Plan Note (Signed)
BP adequately controlled. Continue HCTZ 25 mg daily. Low salt/DASH diet to continue. Eye exam appt on 12/01/21.

## 2021-11-08 NOTE — Assessment & Plan Note (Signed)
HgA1C has been at goal. Continue non pharmacologic treatment. Regular exercise and healthy diet with avoidance of added sugar food intake is an important part of treatment and encouraged. Annual eye exam (appt in 11/2021), periodic dental and foot care recommended. F/U in 5-6 months

## 2021-11-08 NOTE — Assessment & Plan Note (Signed)
LDL goal < 70. Continue Atorvastatin 40 mg daily and low fat diet. Further recommendations according to lipid panel result (not fasting).

## 2021-11-08 NOTE — Patient Instructions (Addendum)
A few things to remember from today's visit:   Type 2 diabetes mellitus with other specified complication, without long-term current use of insulin ([place]) - Plan: Comprehensive metabolic panel, Hemoglobin A1c, Microalbumin / creatinine urine ratio  Hyperlipidemia, unspecified hyperlipidemia type - Plan: Comprehensive metabolic panel, Lipid panel  Essential hypertension - Plan: Comprehensive metabolic panel  PAD (peripheral artery disease) ([place])  If you need refills for medications you take chronically, please call your pharmacy. Do not use My Chart to request refills or for acute issues that need immediate attention. If you send a my chart message, it may take a few days to be addressed, specially if I am not in the office.  Please be sure medication list is accurate. If a new problem present, please set up appointment sooner than planned today.  No changes today.  Diabetes Mellitus and Nutrition, Adult When you have diabetes, or diabetes mellitus, it is very important to have healthy eating habits because your blood sugar (glucose) levels are greatly affected by what you eat and drink. Eating healthy foods in the right amounts, at about the same times every day, can help you: Manage your blood glucose. Lower your risk of heart disease. Improve your blood pressure. Reach or maintain a healthy weight. What can affect my meal plan? Every person with diabetes is different, and each person has different needs for a meal plan. Your health care provider may recommend that you work with a dietitian to make a meal plan that is best for you. Your meal plan may vary depending on factors such as: The calories you need. The medicines you take. Your weight. Your blood glucose, blood pressure, and cholesterol levels. Your activity level. Other health conditions you have, such as heart or kidney disease. How do carbohydrates affect me? Carbohydrates, also called carbs, affect your blood glucose  level more than any other type of food. Eating carbs raises the amount of glucose in your blood. It is important to know how many carbs you can safely have in each meal. This is different for every person. Your dietitian can help you calculate how many carbs you should have at each meal and for each snack. How does alcohol affect me? Alcohol can cause a decrease in blood glucose (hypoglycemia), especially if you use insulin or take certain diabetes medicines by mouth. Hypoglycemia can be a life-threatening condition. Symptoms of hypoglycemia, such as sleepiness, dizziness, and confusion, are similar to symptoms of having too much alcohol. Do not drink alcohol if: Your health care provider tells you not to drink. You are pregnant, may be pregnant, or are planning to become pregnant. If you drink alcohol: Limit how much you have to: 0-1 drink a day for women. 0-2 drinks a day for men. Know how much alcohol is in your drink. In the [place] one drink equals one 12 oz bottle of beer (355 mL), one 5 oz glass of wine (148 mL), or one 1 oz glass of hard liquor (44 mL). Keep yourself hydrated with water, diet soda, or unsweetened iced tea. Keep in mind that regular soda, juice, and other mixers may contain a lot of sugar and must be counted as carbs. What are tips for following this plan?  Reading food labels Start by checking the serving size on the Nutrition Facts label of packaged foods and drinks. The number of calories and the amount of carbs, fats, and other nutrients listed on the label are based on one serving of the item. Many items contain  more than one serving per package. Check the total grams (g) of carbs in one serving. Check the number of grams of saturated fats and trans fats in one serving. Choose foods that have a low amount or none of these fats. Check the number of milligrams (mg) of salt (sodium) in one serving. Most people should limit total sodium intake to less than 2,300 mg per  day. Always check the nutrition information of foods labeled as "low-fat" or "nonfat." These foods may be higher in added sugar or refined carbs and should be avoided. Talk to your dietitian to identify your daily goals for nutrients listed on the label. Shopping Avoid buying canned, pre-made, or processed foods. These foods tend to be high in fat, sodium, and added sugar. Shop around the outside edge of the grocery store. This is where you will most often find fresh fruits and vegetables, bulk grains, fresh meats, and fresh dairy products. Cooking Use low-heat cooking methods, such as baking, instead of high-heat cooking methods, such as deep frying. Cook using healthy oils, such as olive, canola, or sunflower oil. Avoid cooking with butter, cream, or high-fat meats. Meal planning Eat meals and snacks regularly, preferably at the same times every day. Avoid going long periods of time without eating. Eat foods that are high in fiber, such as fresh fruits, vegetables, beans, and whole grains. Eat 4-6 oz (112-168 g) of lean protein each day, such as lean meat, chicken, fish, eggs, or tofu. One ounce (oz) (28 g) of lean protein is equal to: 1 oz (28 g) of meat, chicken, or fish. 1 egg.  cup (62 g) of tofu. Eat some foods each day that contain healthy fats, such as avocado, nuts, seeds, and fish. What foods should I eat? Fruits Berries. Apples. Oranges. Peaches. Apricots. Plums. Grapes. Mangoes. Papayas. Pomegranates. Kiwi. Cherries. Vegetables Leafy greens, including lettuce, spinach, kale, chard, collard greens, mustard greens, and cabbage. Beets. Cauliflower. Broccoli. Carrots. Green beans. Tomatoes. Peppers. Onions. Cucumbers. Brussels sprouts. Grains Whole grains, such as whole-wheat or whole-grain bread, crackers, tortillas, cereal, and pasta. Unsweetened oatmeal. Quinoa. Brown or wild rice. Meats and other proteins Seafood. Poultry without skin. Lean cuts of poultry and beef. Tofu.  Nuts. Seeds. Dairy Low-fat or fat-free dairy products such as milk, yogurt, and cheese. The items listed above may not be a complete list of foods and beverages you can eat and drink. Contact a dietitian for more information. What foods should I avoid? Fruits Fruits canned with syrup. Vegetables Canned vegetables. Frozen vegetables with butter or cream sauce. Grains Refined white flour and flour products such as bread, pasta, snack foods, and cereals. Avoid all processed foods. Meats and other proteins Fatty cuts of meat. Poultry with skin. Breaded or fried meats. Processed meat. Avoid saturated fats. Dairy Full-fat yogurt, cheese, or milk. Beverages Sweetened drinks, such as soda or iced tea. The items listed above may not be a complete list of foods and beverages you should avoid. Contact a dietitian for more information. Questions to ask a health care provider Do I need to meet with a certified diabetes care and education specialist? Do I need to meet with a dietitian? What number can I call if I have questions? When are the best times to check my blood glucose? Where to find more information: American Diabetes Association: diabetes.org Academy of Nutrition and Dietetics: eatright.[employer] of Diabetes and Digestive and Kidney Diseases: [employer] Association of Diabetes Care & Education Specialists: diabeteseducator.org Summary It is important to have  healthy eating habits because your blood sugar (glucose) levels are greatly affected by what you eat and drink. It is important to use alcohol carefully. A healthy meal plan will help you manage your blood glucose and lower your risk of heart disease. Your health care provider may recommend that you work with a dietitian to make a meal plan that is best for you. This information is not intended to replace advice given to you by your health care provider. Make sure you discuss any questions you have with your health  care provider. Document Revised: 09/10/2019 Document Reviewed: 09/10/2019 Elsevier Patient Education  [place].

## 2021-11-10 ENCOUNTER — Encounter: Payer: Self-pay | Admitting: Family Medicine

## 2021-11-28 ENCOUNTER — Other Ambulatory Visit: Payer: Self-pay | Admitting: Family Medicine

## 2021-11-28 ENCOUNTER — Telehealth: Payer: Self-pay | Admitting: Family Medicine

## 2021-11-28 DIAGNOSIS — G4700 Insomnia, unspecified: Secondary | ICD-10-CM

## 2021-11-28 NOTE — Telephone Encounter (Signed)
Pt call and want to know if she can take a ibuprofen she stated she take a baby aspirin every day.

## 2021-11-30 NOTE — Telephone Encounter (Signed)
She could take OTC ibuprofen 200 mg occasionally and 6-8 hours apart from baby aspirin. BJ

## 2021-11-30 NOTE — Telephone Encounter (Signed)
I called and spoke with patient. She is aware of message below. 

## 2021-12-01 DIAGNOSIS — H524 Presbyopia: Secondary | ICD-10-CM | POA: Diagnosis not present

## 2021-12-01 DIAGNOSIS — H43813 Vitreous degeneration, bilateral: Secondary | ICD-10-CM | POA: Diagnosis not present

## 2021-12-01 DIAGNOSIS — H4321 Crystalline deposits in vitreous body, right eye: Secondary | ICD-10-CM | POA: Diagnosis not present

## 2021-12-01 DIAGNOSIS — H5202 Hypermetropia, left eye: Secondary | ICD-10-CM | POA: Diagnosis not present

## 2021-12-01 DIAGNOSIS — H2513 Age-related nuclear cataract, bilateral: Secondary | ICD-10-CM | POA: Diagnosis not present

## 2021-12-01 LAB — HM DIABETES EYE EXAM

## 2021-12-02 ENCOUNTER — Encounter: Payer: Self-pay | Admitting: Family Medicine

## 2022-01-23 NOTE — Progress Notes (Unsigned)
ACUTE VISIT No chief complaint on file.  HPI: Nancy Chandler is a 66 y.o. female, who is here today complaining of *** HPI  Review of Systems See other pertinent positives and negatives in HPI.  Current Outpatient Medications on File Prior to Visit  Medication Sig Dispense Refill   aspirin 81 MG EC tablet      atorvastatin (LIPITOR) 40 MG tablet Take 1 tablet (40 mg total) by mouth daily. 90 tablet 3   clobetasol ointment (TEMOVATE) 0.05 % Apply bid for itching for up to 5 days.  Call office if itching is not relieved after that time. 30 g 2   EPINEPHrine (EPIPEN 2-PAK) 0.3 mg/0.3 mL IJ SOAJ injection Inject 0.3 mg into the muscle as needed for anaphylaxis. 1 each 1   fluticasone (FLONASE) 50 MCG/ACT nasal spray Place into the nose as needed.     hydrochlorothiazide (MICROZIDE) 12.5 MG capsule TAKE 1 CAPSULE BY MOUTH EVERY DAY 90 capsule 3   ibuprofen (ADVIL) 600 MG tablet Take 1 tablet (600 mg total) by mouth every 6 (six) hours as needed. 30 tablet 0   Multiple Vitamin (MULTIVITAMIN) tablet Take 1 tablet by mouth daily.     pantoprazole (PROTONIX) 40 MG tablet TAKE 1 TABLET BY MOUTH 2 TIMES DAILY BEFORE BREAKFAST AND BEFORE DINNER. TAKE ON AN EMPTY STOMACH 180 tablet 1   No current facility-administered medications on file prior to visit.    Past Medical History:  Diagnosis Date   Abnormal Pap smear of cervix    neg HPV HR+ 16/18 neg 2017, 2018 LGSIL HPV-, 2019 ASCUS HPV HR+, 08-21-2018 LGSIL HPV HR-   Anxiety    Depression    Diabetes mellitus without complication (HCC)    Dyspareunia    History of colon polyps    Hyperlipidemia    Hypertension    Lichen sclerosus    Thyroid disease    Allergies  Allergen Reactions   Bee Venom Anaphylaxis    Carries Epi pen    Hydrocodone Nausea And Vomiting    Social History   Socioeconomic History   Marital status: Married    Spouse name: Not on file   Number of children: 2   Years of education: Not on file    Highest education level: Some college, no degree  Occupational History   Not on file  Tobacco Use   Smoking status: Never   Smokeless tobacco: Never  Vaping Use   Vaping Use: Never used  Substance and Sexual Activity   Alcohol use: No    Alcohol/week: 0.0 standard drinks of alcohol   Drug use: No   Sexual activity: Yes    Partners: Male    Birth control/protection: Surgical    Comment: Tubal ligation  Other Topics Concern   Not on file  Social History Narrative   Not on file   Social Determinants of Health   Financial Resource Strain: Low Risk  (11/04/2021)   Overall Financial Resource Strain (CARDIA)    Difficulty of Paying Living Expenses: Not hard at all  Food Insecurity: No Food Insecurity (11/04/2021)   Hunger Vital Sign    Worried About Running Out of Food in the Last Year: Never true    Ran Out of Food in the Last Year: Never true  Transportation Needs: No Transportation Needs (11/04/2021)   PRAPARE - Hydrologist (Medical): No    Lack of Transportation (Non-Medical): No  Physical Activity: Insufficiently Active (11/04/2021)  Exercise Vital Sign    Days of Exercise per Week: 2 days    Minutes of Exercise per Session: 30 min  Stress: Stress Concern Present (11/04/2021)       Feeling of Stress : To some extent  Social Connections: Socially Integrated (11/04/2021)   Social Connection and Isolation Panel [NHANES]    Frequency of Communication with Friends and Family: More than three times a week    Frequency of Social Gatherings with Friends and Family: More than three times a week    Attends Religious Services: More than 4 times per year    Active Member of [employer] or Organizations: Yes    Attends Music therapist: More than 4 times per year    Marital Status: Married    There were no vitals filed for this visit. There is no height or weight on file to  calculate BMI.  Physical Exam  ASSESSMENT AND PLAN: There are no diagnoses linked to this encounter.  No follow-ups on file.  Braheem Tomasik G. [place], MD  [facility]. [place] office.  Discharge Instructions   None

## 2022-01-24 ENCOUNTER — Ambulatory Visit (INDEPENDENT_AMBULATORY_CARE_PROVIDER_SITE_OTHER): Payer: Medicare Other | Admitting: Family Medicine

## 2022-01-24 ENCOUNTER — Encounter: Payer: Self-pay | Admitting: Family Medicine

## 2022-01-24 VITALS — BP 130/70 | HR 78 | Temp 98.4°F | Resp 16 | Ht 63.0 in | Wt 140.5 lb

## 2022-01-24 DIAGNOSIS — R1012 Left upper quadrant pain: Secondary | ICD-10-CM

## 2022-01-24 DIAGNOSIS — K219 Gastro-esophageal reflux disease without esophagitis: Secondary | ICD-10-CM

## 2022-01-24 NOTE — Patient Instructions (Addendum)
A few things to remember from today's visit:  LUQ abdominal pain  Gastroesophageal reflux disease without esophagitis  Abdominal pain could be muscular. For now recommend monitoring for new symptoms. Stretching exercises and topical icy hot or asper cream at bedtime. No changes in GERD medication, cautions with certain foods that could irritate problem.  If you need refills for medications you take chronically, please call your pharmacy. Do not use My Chart to request refills or for acute issues that need immediate attention. If you send a my chart message, it may take a few days to be addressed, specially if I am not in the office.  Please be sure medication list is accurate. If a new problem present, please set up appointment sooner than planned today.

## 2022-01-24 NOTE — Assessment & Plan Note (Addendum)
We discussed possible etiologies. Hx and examination do not suggest a serious process. ? Musculoskeletal. For now I do not think further work up is needed. Instructed to monitor for new symptoms, including rash on affected area. Stretching exercises and icy hot/asper cream at bedtime for a few days. Instructed about warning signs. She voices understanding a agrees with plan. F/U as needed.

## 2022-01-24 NOTE — Assessment & Plan Note (Signed)
Nausea could be related to this problem. Continue Pantoprazole 40 mg daily. Avoid foods that can aggravate problem. GERD precautions to continue.

## 2022-02-28 DIAGNOSIS — Z1231 Encounter for screening mammogram for malignant neoplasm of breast: Secondary | ICD-10-CM | POA: Diagnosis not present

## 2022-02-28 LAB — HM MAMMOGRAPHY

## 2022-03-02 ENCOUNTER — Encounter: Payer: Self-pay | Admitting: Family Medicine

## 2022-03-08 DIAGNOSIS — M810 Age-related osteoporosis without current pathological fracture: Secondary | ICD-10-CM | POA: Diagnosis not present

## 2022-03-08 DIAGNOSIS — M8588 Other specified disorders of bone density and structure, other site: Secondary | ICD-10-CM | POA: Diagnosis not present

## 2022-03-08 DIAGNOSIS — Z780 Asymptomatic menopausal state: Secondary | ICD-10-CM | POA: Diagnosis not present

## 2022-03-08 LAB — HM DEXA SCAN

## 2022-03-15 NOTE — Progress Notes (Signed)
HPI: Nancy Chandler is a 67 y.o. female, who is here today for chronic disease management.  She was last seen on 01/24/2022 for abdominal pain,it has resolved..  Hypertension: Currently she is on HCTZ 12.5 mg daily. Negative for unusual or severe headache, visual changes, exertional chest pain, dyspnea,  focal weakness, or edema.  Lab Results  Component Value Date   CREATININE 0.88 11/08/2021   BUN 12 11/08/2021   NA 142 11/08/2021   K 3.8 11/08/2021   CL 104 11/08/2021   CO2 32 11/08/2021   Diabetes Mellitus II: Diagnosed in 2016 with a hemoglobin A1c of 6.5. Currently she is on nonpharmacologic treatment. Negative for symptoms of hypoglycemia, polyuria, polydipsia, numbness extremities, foot ulcers/trauma Lab Results  Component Value Date   HGBA1C 6.6 (H) 11/08/2021   Lab Results  Component Value Date   MICROALBUR <0.7 11/08/2021   HLD: Currently she is on atorvastatin 40 mg daily, which was increased in 10/2021. PAD with ABI on 04/07/21 showed the left toe-brachial index is abnormal.  Denies claudication-like symptoms.  Lab Results  Component Value Date   CHOL 157 11/08/2021   HDL 58.50 11/08/2021   LDLCALC 86 11/08/2021   TRIG 63.0 11/08/2021   CHOLHDL 3 11/08/2021   Today she is c/o left upper chest dull pain, which she thinks is related to GERD. Exacerbated by food intake and alleviated by burping. Mylanta also helps. Negative for associated diaphoresis, dyspnea, cough, or wheezing. She has not noted abdominal pain, nausea, melena, or urinary symptoms.  Osteoporosis: She tried Fosamax but caused nausea. DEXA 03/08/2022 showed osteoporosis, right femoral neck -2.1 left -2.7. Reported history of vitamin D deficiency, currently she is on vitamin D 2000 units daily.  Review of Systems  Constitutional:  Negative for chills and fever.  HENT:  Negative for sore throat and trouble swallowing.   Gastrointestinal:  Negative for abdominal pain and vomiting.        No changes in bowel habits.  Genitourinary:  Negative for decreased urine volume, dysuria and hematuria.  Musculoskeletal:  Negative for back pain and gait problem.  Neurological:  Negative for syncope and facial asymmetry.  See other pertinent positives and negatives in HPI.  Current Outpatient Medications on File Prior to Visit  Medication Sig Dispense Refill   aspirin 81 MG EC tablet      atorvastatin (LIPITOR) 40 MG tablet Take 1 tablet (40 mg total) by mouth daily. 90 tablet 3   clobetasol ointment (TEMOVATE) 0.05 % Apply bid for itching for up to 5 days.  Call office if itching is not relieved after that time. 30 g 2   EPINEPHrine (EPIPEN 2-PAK) 0.3 mg/0.3 mL IJ SOAJ injection Inject 0.3 mg into the muscle as needed for anaphylaxis. 1 each 1   fluticasone (FLONASE) 50 MCG/ACT nasal spray Place into the nose as needed.     hydrochlorothiazide (MICROZIDE) 12.5 MG capsule TAKE 1 CAPSULE BY MOUTH EVERY DAY 90 capsule 3   ibuprofen (ADVIL) 600 MG tablet Take 1 tablet (600 mg total) by mouth every 6 (six) hours as needed. 30 tablet 0   Multiple Vitamin (MULTIVITAMIN) tablet Take 1 tablet by mouth daily.     pantoprazole (PROTONIX) 40 MG tablet TAKE 1 TABLET BY MOUTH 2 TIMES DAILY BEFORE BREAKFAST AND BEFORE DINNER. TAKE ON AN EMPTY STOMACH 180 tablet 1   No current facility-administered medications on file prior to visit.   Past Medical History:  Diagnosis Date   Abnormal Pap smear of cervix  neg HPV HR+ 16/18 neg 2017, 2018 LGSIL HPV-, 2019 ASCUS HPV HR+, 08-21-2018 LGSIL HPV HR-   Anxiety    Depression    Diabetes mellitus without complication (HCC)    Dyspareunia    History of colon polyps    Hyperlipidemia    Hypertension    Lichen sclerosus    Thyroid disease    Allergies  Allergen Reactions   Bee Venom Anaphylaxis    Carries Epi pen    Hydrocodone Nausea And Vomiting   Social History   Socioeconomic History   Marital status: Married    Spouse name: Not on file    Number of children: 2   Years of education: Not on file   Highest education level: Some college, no degree  Occupational History   Not on file  Tobacco Use   Smoking status: Never   Smokeless tobacco: Never  Vaping Use   Vaping Use: Never used  Substance and Sexual Activity   Alcohol use: No    Alcohol/week: 0.0 standard drinks of alcohol   Drug use: No   Sexual activity: Yes    Partners: Male    Birth control/protection: Surgical    Comment: Tubal ligation  Other Topics Concern   Not on file  Social History Narrative   Not on file   Social Determinants of Health   Financial Resource Strain: Low Risk  (11/04/2021)   Overall Financial Resource Strain (CARDIA)    Difficulty of Paying Living Expenses: Not hard at all  Food Insecurity: No Food Insecurity (11/04/2021)   Hunger Vital Sign    Worried About Running Out of Food in the Last Year: Never true    Ran Out of Food in the Last Year: Never true  Transportation Needs: No Transportation Needs (11/04/2021)   PRAPARE - Hydrologist (Medical): No    Lack of Transportation (Non-Medical): No  Physical Activity: Insufficiently Active (11/04/2021)   Exercise Vital Sign    Days of Exercise per Week: 2 days    Minutes of Exercise per Session: 30 min  Stress: Stress Concern Present (11/04/2021)   [place]    Feeling of Stress : To some extent  Social Connections: Socially Integrated (11/04/2021)   Social Connection and Isolation Panel [NHANES]    Frequency of Communication with Friends and Family: More than three times a week    Frequency of Social Gatherings with Friends and Family: More than three times a week    Attends Religious Services: More than 4 times per year    Active Member of [employer] or Organizations: Yes    Attends Music therapist: More than 4 times per year    Marital Status: Married   Today's Vitals   03/17/22  0757  BP: 120/74  Pulse: 88  Resp: 12  Temp: 98.4 F (36.9 C)  TempSrc: Oral  SpO2: 99%  Weight: 139 lb 8 oz (63.3 kg)  Height: '5\' 3"'$  (1.6 m)   Body mass index is 24.71 kg/m.  Physical Exam Vitals and nursing note reviewed.  Constitutional:      General: She is not in acute distress.    Appearance: She is well-developed and well-groomed.  HENT:     Head: Normocephalic and atraumatic.     Mouth/Throat:     Mouth: Mucous membranes are moist.     Pharynx: Oropharynx is clear.  Eyes:     Conjunctiva/sclera: Conjunctivae normal.  Cardiovascular:  Rate and Rhythm: Normal rate and regular rhythm.     Heart sounds: No murmur heard.    Comments: DP pulses palpable. Pulmonary:     Effort: Pulmonary effort is normal. No respiratory distress.     Breath sounds: Normal breath sounds.  Chest:     Chest wall: No tenderness.  Abdominal:     Palpations: Abdomen is soft. There is no hepatomegaly or mass.     Tenderness: There is no abdominal tenderness.  Skin:    General: Skin is warm.     Findings: No erythema or rash.  Neurological:     General: No focal deficit present.     Mental Status: She is alert and oriented to person, place, and time.     Cranial Nerves: No cranial nerve deficit.     Gait: Gait normal.  Psychiatric:        Mood and Affect: Affect normal. Mood is anxious.   ASSESSMENT AND PLAN:  Nancy Chandler was seen today for medical management of chronic issues. Diagnoses and all orders for this visit: Lab Results  Component Value Date   CHOL 174 03/17/2022   HDL 66.30 03/17/2022   LDLCALC 94 03/17/2022   TRIG 71.0 03/17/2022   CHOLHDL 3 03/17/2022   Lab Results  Component Value Date   CREATININE 0.96 03/17/2022   BUN 17 03/17/2022   NA 142 03/17/2022   K 3.8 03/17/2022   CL 103 03/17/2022   CO2 32 03/17/2022   Lab Results  Component Value Date   HGBA1C 5.9 03/17/2022   Type 2 diabetes mellitus with other specified complication, without long-term  current use of insulin (HCC) Assessment & Plan: Problem is well-controlled, HgA1C improved, went from 6.6 to 5.9. Continue non pharmacologic treatment. Annual eye exam, periodic dental and foot care recommended. F/U in 5-6 months.  Orders: -     POCT glycosylated hemoglobin (Hb A1C)  Essential hypertension Assessment & Plan: BP adequately controlled. Continue HCTZ 12.5 mg daily and low salt/DASH diet. Eye exam is up to date.  Orders: -     Basic metabolic panel; Future  PAD (peripheral artery disease) (HCC) Assessment & Plan: Continue Atorvastatin 40 mg daily and Aspirin 81 mg daily. Last LDL 86 in 10/2021.  Orders: -     Lipid panel; Future  Chest pain, unspecified type Assessment & Plan: We discussed possible etiologies, including cardiac, GI,and musculoskeletal. CXR ordered. EKG today NSR, normal axis and intervals.  No significant changes when compared with EKG done in 03/2021. Negative Cardiolite stress test in 2016. Instructed about warning signs.   Orders: -     DG Chest 2 View; Future -     EKG 12-Lead  Vitamin D deficiency, unspecified Assessment & Plan: Reported by patient. Continue current dose of vitamin D supplementation. Further recommendation will be given according to 25 [place] vitamin D result.  Orders: -     VITAMIN D 25 Hydroxy (Vit-D Deficiency, Fractures); Future  Gastroesophageal reflux disease without esophagitis Assessment & Plan: Currently she is on Protonix 40 mg daily, which she will discontinue. She will try Nexium. Continue GERD precautions. Follow-up in 2 months, before if needed.  Orders: -     Esomeprazole Magnesium; Take 1 capsule (40 mg total) by mouth daily.  Dispense: 30 capsule; Refill: 3  Localized osteoporosis without current pathological fracture Assessment & Plan: She did not tolerate Fosamax in the past, GI side effects. We discussed other treatment options, including monthly oral medication and Prolia, she would like  to  try the latter one. Continue Ca++ and vit D supplementation, fall prevention,and regular physical activity.   Need for pneumococcal vaccination -     Pneumococcal conjugate vaccine 20-valent   I spent a total of 43 minutes in both face to face and non face to face activities for this visit on the date of this encounter, excluding the time for EKG. During this time history was obtained and documented, examination was performed, prior labs/imaging reviewed, and assessment/plan discussed.  Return in about 2 months (around 05/16/2022) for GERD.  Althia Egolf G. [place], MD  [facility]. [place] office.

## 2022-03-17 ENCOUNTER — Telehealth: Payer: Self-pay

## 2022-03-17 ENCOUNTER — Encounter: Payer: Self-pay | Admitting: Family Medicine

## 2022-03-17 ENCOUNTER — Ambulatory Visit (INDEPENDENT_AMBULATORY_CARE_PROVIDER_SITE_OTHER)
Admission: RE | Admit: 2022-03-17 | Discharge: 2022-03-17 | Disposition: A | Discharge status: Home / Self Care | Payer: Medicare Other | Source: Ambulatory Visit | Attending: Family Medicine | Admitting: Family Medicine

## 2022-03-17 ENCOUNTER — Other Ambulatory Visit: Payer: Self-pay

## 2022-03-17 ENCOUNTER — Ambulatory Visit (INDEPENDENT_AMBULATORY_CARE_PROVIDER_SITE_OTHER): Payer: Medicare Other | Admitting: Family Medicine

## 2022-03-17 VITALS — BP 120/74 | HR 88 | Temp 98.4°F | Resp 12 | Ht 63.0 in | Wt 139.5 lb

## 2022-03-17 DIAGNOSIS — R079 Chest pain, unspecified: Secondary | ICD-10-CM

## 2022-03-17 DIAGNOSIS — K219 Gastro-esophageal reflux disease without esophagitis: Secondary | ICD-10-CM | POA: Diagnosis not present

## 2022-03-17 DIAGNOSIS — M816 Localized osteoporosis [Lequesne]: Secondary | ICD-10-CM | POA: Diagnosis not present

## 2022-03-17 DIAGNOSIS — E559 Vitamin D deficiency, unspecified: Secondary | ICD-10-CM | POA: Insufficient documentation

## 2022-03-17 DIAGNOSIS — I10 Essential (primary) hypertension: Secondary | ICD-10-CM | POA: Diagnosis not present

## 2022-03-17 DIAGNOSIS — Z23 Encounter for immunization: Secondary | ICD-10-CM

## 2022-03-17 DIAGNOSIS — E1169 Type 2 diabetes mellitus with other specified complication: Secondary | ICD-10-CM | POA: Diagnosis not present

## 2022-03-17 DIAGNOSIS — E785 Hyperlipidemia, unspecified: Secondary | ICD-10-CM

## 2022-03-17 DIAGNOSIS — I739 Peripheral vascular disease, unspecified: Secondary | ICD-10-CM | POA: Diagnosis not present

## 2022-03-17 LAB — LIPID PANEL
Cholesterol: 174 mg/dL (ref 0–200)
HDL: 66.3 mg/dL (ref >39.00)
LDL Cholesterol: 94 mg/dL (ref 0–99)
NonHDL: 108.11
Total CHOL/HDL Ratio: 3
Triglycerides: 71 mg/dL (ref 0.0–149.0)
VLDL: 14.2 mg/dL (ref 0.0–40.0)

## 2022-03-17 LAB — BASIC METABOLIC PANEL
BUN: 17 mg/dL (ref 6–23)
CO2: 32 mEq/L (ref 19–32)
Calcium: 9.3 mg/dL (ref 8.4–10.5)
Chloride: 103 mEq/L (ref 96–112)
Creatinine, Ser: 0.96 mg/dL (ref 0.40–1.20)
GFR: 61.2 mL/min (ref >60.00)
Glucose, Bld: 107 mg/dL — ABNORMAL HIGH (ref 70–99)
Potassium: 3.8 mEq/L (ref 3.5–5.1)
Sodium: 142 mEq/L (ref 135–145)

## 2022-03-17 LAB — POCT GLYCOSYLATED HEMOGLOBIN (HGB A1C): HbA1c, POC (prediabetic range): 5.9 % (ref 5.7–6.4)

## 2022-03-17 LAB — VITAMIN D 25 HYDROXY (VIT D DEFICIENCY, FRACTURES): VITD: 14 ng/mL — ABNORMAL LOW (ref 30.00–100.00)

## 2022-03-17 MED ORDER — ESOMEPRAZOLE MAGNESIUM 40 MG PO CPDR: 40.0000 mg | DELAYED_RELEASE_CAPSULE | Freq: Every day | ORAL | 3 refills | Status: DC

## 2022-03-17 MED ORDER — ATORVASTATIN CALCIUM 80 MG PO TABS: 80.0000 mg | ORAL_TABLET | Freq: Every day | ORAL | 3 refills | Status: DC

## 2022-03-17 MED ORDER — VITAMIN D (ERGOCALCIFEROL) 1.25 MG (50000 UNIT) PO CAPS: 50000.0000 [IU] | ORAL_CAPSULE | ORAL | 0 refills | Status: AC

## 2022-03-17 NOTE — Assessment & Plan Note (Signed)
Continue Atorvastatin 40 mg daily and Aspirin 81 mg daily. Last LDL 86 in 10/2021.

## 2022-03-17 NOTE — Assessment & Plan Note (Signed)
BP adequately controlled. Continue HCTZ 12.5 mg daily and low salt/DASH diet. Eye exam is up to date.

## 2022-03-17 NOTE — Assessment & Plan Note (Addendum)
Currently she is on Protonix 40 mg daily, which she will discontinue. She will try Nexium. Continue GERD precautions. Follow-up in 2 months, before if needed.

## 2022-03-17 NOTE — Assessment & Plan Note (Signed)
Reported by patient. Continue current dose of vitamin D supplementation. Further recommendation will be given according to 25 OH vitamin D result.

## 2022-03-17 NOTE — Telephone Encounter (Signed)
Prolia benefits submitted through [place]. Awaiting insurance determination.

## 2022-03-17 NOTE — Assessment & Plan Note (Addendum)
Problem is well-controlled, HgA1C improved, went from 6.6 to 5.9. Continue non pharmacologic treatment. Annual eye exam, periodic dental and foot care recommended. F/U in 5-6 months.

## 2022-03-17 NOTE — Assessment & Plan Note (Addendum)
We discussed possible etiologies, including cardiac, GI,and musculoskeletal. CXR ordered. EKG today NSR, normal axis and intervals.  No significant changes when compared with EKG done in 03/2021. Negative Cardiolite stress test in 2016. Instructed about warning signs.

## 2022-03-17 NOTE — Patient Instructions (Addendum)
A few things to remember from today's visit:  Type 2 diabetes mellitus with other specified complication, without long-term current use of insulin ([place]) - Plan: POC HgB A1c  Essential hypertension - Plan: Basic metabolic panel  PAD (peripheral artery disease) ([place]) - Plan: Lipid panel  Chest pain, unspecified type - Plan: DG Chest 2 View, EKG 12-Lead  Vitamin D deficiency, unspecified - Plan: VITAMIN D 25 Hydroxy (Vit-D Deficiency, Fractures)  Gastroesophageal reflux disease without esophagitis - Plan: esomeprazole (NEXIUM) 40 MG capsule  Stop Protonix and try Nexium. Continue avoiding foods that can irritate problem.  If you need refills for medications you take chronically, please call your pharmacy. Do not use My Chart to request refills or for acute issues that need immediate attention. If you send a my chart message, it may take a few days to be addressed, specially if I am not in the office.  Please be sure medication list is accurate. If a new problem present, please set up appointment sooner than planned today.

## 2022-03-17 NOTE — Assessment & Plan Note (Signed)
She did not tolerate Fosamax in the past, GI side effects. We discussed other treatment options, including monthly oral medication and Prolia, she would like to try the latter one. Continue Ca++ and vit D supplementation, fall prevention,and regular physical activity.

## 2022-03-25 ENCOUNTER — Other Ambulatory Visit: Payer: Self-pay | Admitting: Family Medicine

## 2022-03-28 ENCOUNTER — Ambulatory Visit: Payer: Medicare Other | Admitting: Family Medicine

## 2022-03-29 ENCOUNTER — Encounter: Payer: Self-pay | Admitting: Family Medicine

## 2022-04-11 ENCOUNTER — Ambulatory Visit (HOSPITAL_BASED_OUTPATIENT_CLINIC_OR_DEPARTMENT_OTHER): Payer: Medicare Other | Admitting: Cardiology

## 2022-04-11 ENCOUNTER — Encounter (HOSPITAL_BASED_OUTPATIENT_CLINIC_OR_DEPARTMENT_OTHER): Payer: Self-pay | Admitting: Cardiology

## 2022-04-11 VITALS — BP 130/60 | HR 68 | Ht 63.0 in | Wt 141.0 lb

## 2022-04-11 DIAGNOSIS — I10 Essential (primary) hypertension: Secondary | ICD-10-CM

## 2022-04-11 DIAGNOSIS — R6889 Other general symptoms and signs: Secondary | ICD-10-CM | POA: Diagnosis not present

## 2022-04-11 DIAGNOSIS — Z7189 Other specified counseling: Secondary | ICD-10-CM

## 2022-04-11 DIAGNOSIS — R079 Chest pain, unspecified: Secondary | ICD-10-CM | POA: Diagnosis not present

## 2022-04-11 DIAGNOSIS — E7800 Pure hypercholesterolemia, unspecified: Secondary | ICD-10-CM

## 2022-04-11 MED ORDER — METOPROLOL TARTRATE 25 MG PO TABS: 25.0000 mg | ORAL_TABLET | Freq: Once | ORAL | 0 refills | Status: DC

## 2022-04-11 NOTE — Patient Instructions (Signed)
Medication Instructions:  Metoprolol 25 mg take one 2 hrs prior to your scheduled CT appt.  *If you need a refill on your cardiac medications before your next appointment, please call your pharmacy*   Lab Work: BMET 1 week before CT  If you have labs (blood work) drawn today and your tests are completely normal, you will receive your results only by: [place] (if you have MyChart) OR A paper copy in the mail If you have any lab test that is abnormal or we need to change your treatment, we will call you to review the results.   Testing/Procedures: .  Your cardiac CT will be scheduled at one of the below locations:   [facility] [address] [place], [place] [address] [phone]   If scheduled at [facility], please arrive at the [facility] and Children's Entrance (Entrance C2) of [facility] 30 minutes prior to test start time. You can use the FREE valet parking offered at entrance C (encouraged to control the heart rate for the test)  Proceed to the [facility] Radiology Department (first floor) to check-in and test prep.  All radiology patients and guests should use entrance C2 at [facility], accessed from [facility], even though the hospital's physical address listed is [address].     Please follow these instructions carefully (unless otherwise directed):  On the Night Before the Test: Be sure to Drink plenty of water. Do not consume any caffeinated/decaffeinated beverages or chocolate 12 hours prior to your test. Do not take any antihistamines 12 hours prior to your test.  On the Day of the Test: Drink plenty of water until 1 hour prior to the test. Do not eat any food 1 hour prior to test. You may take your regular medications prior to the test.  Take metoprolol (Lopressor) two hours prior to test. FEMALES- please wear underwire-free bra if available, avoid dresses & tight clothing   *For  Clinical Staff only. Please instruct patient the following:* Heart Rate Medication Recommendations for Cardiac CT  Resting HR < 50 bpm  No medication  Resting HR 50-60 bpm and BP >110/50 mmHG   Consider Metoprolol tartrate 25 mg PO 90-120 min prior to scan       After the Test: Drink plenty of water. After receiving IV contrast, you may experience a mild flushed feeling. This is normal. On occasion, you may experience a mild rash up to 24 hours after the test. This is not dangerous. If this occurs, you can take Benadryl 25 mg and increase your fluid intake. If you experience trouble breathing, this can be serious. If it is severe call 911 IMMEDIATELY. If it is mild, please call our office. If you take any of these medications: Glipizide/Metformin, Avandament, Glucavance, please do not take 48 hours after completing test unless otherwise instructed.  We will call to schedule your test 2-4 weeks out understanding that some insurance companies will need an authorization prior to the service being performed.   For non-scheduling related questions, please contact the cardiac imaging nurse navigator should you have any questions/concerns: Marchia Bond, Cardiac Imaging Nurse Navigator Gordy Clement, Cardiac Imaging Nurse Navigator [facility] Direct Office Dial: [phone]   For scheduling needs, including cancellations and rescheduling, please call [place], [phone].    Follow-Up: At [facility], you and your health needs are our priority.  As part of our continuing mission to provide you with  exceptional heart care, we have created designated Provider Care Teams.  These Care Teams include your primary Cardiologist (physician) and Advanced Practice Providers (APPs -  Physician Assistants and Nurse Practitioners) who all work together to provide you with the care you need, when you need it.  We recommend signing up for the patient portal called  "MyChart".  Sign up information is provided on this After Visit Summary.  MyChart is used to connect with patients for Virtual Visits (Telemedicine).  Patients are able to view lab/test results, encounter notes, upcoming appointments, etc.  Non-urgent messages can be sent to your provider as well.   To learn more about what you can do with MyChart, go to [email or web address].    Your next appointment:   4 week(s)  Provider:   Buford Dresser, MD    Other Instructions None

## 2022-04-11 NOTE — Progress Notes (Signed)
Cardiology Office Note:    Date:  04/11/2022   ID:  Nancy, Chandler [birth year], MRN [id number]  PCP:  [place], Betty G, MD  Cardiologist:  Buford Dresser, MD  Referring MD: [place], Betty G, MD   CC: follow up  History of Present Illness:    Nancy Chandler is a 67 y.o. female with a hx of hypertension who is seen for follow up. I initially met her 04/30/19 as a new consult at the request of [place], Malka So, MD for the evaluation and management of palpitations.  At her previous appointment on 03/29/2021 she reported that she had been doing well and mentioned she had been following up with GI for GERD symptoms. She had a home health nurse through her insurance and mentioned she had been told that the circulation in her leg was not good.  Per Dr. Doug Sou note, home readings note ABI on her RLE 0.49, LLE 0.94. She had considerable anxiety about her leg since then. Noted good pulses except for right PT at this visit. She was already taking a statin at this visit. She also reported occasional knee tenderness, which was similar to what she had mentioned prior to this visit.   Today, she mentions she has been experiencing some chest pain. Previously she was having issues with GERD and she has started medication and it has helped but from time to time she does have pain. She mentions that both of her grandmothers had CHF and she wanted to get it checked.  Currently she is experiencing the pain in the upper left of her chest. At times it can be really bad. Sometimes she will take the medication she was given for the GERD and it will still hurt. The pain is hurting most days. She has been trying to modify her diet so the pain will not be triggered by avoiding things like onions and tomatoes. The pain is intermittent in nature. It will last 10-15 minutes at a time. She denies any lightheadedness or SOB when these episodes occur. She has not noticed anything that makes it better or worse.  At times her arm will also ache, but she acknowledges a history of arthritis. There have been times when the pain is bad enough that she has considered going to the ED. Palpation or adjusting her position does not seem to change the pain. She has tried Tums in the past to help with the symptoms but she was anxious about a possible complication with her prescription medication. The pain has been going on since December. She mentions that she is still able to walk and carry out her daily activities without limitation from the pain.   She denies any palpitations, shortness of breath, or peripheral edema. No lightheadedness, headaches, syncope, orthopnea, or PND.   Past Medical History:  Diagnosis Date   Abnormal Pap smear of cervix    neg HPV HR+ 16/18 neg 2017, 2018 LGSIL HPV-, 2019 ASCUS HPV HR+, 08-21-2018 LGSIL HPV HR-   Anxiety    Depression    Diabetes mellitus without complication ([place])    Dyspareunia    History of colon polyps    Hyperlipidemia    Hypertension    Lichen sclerosus    Thyroid disease     Past Surgical History:  Procedure Laterality Date   COLPOSCOPY  2010, 2018, 2019   History of LSIL, ASCUS + HPV   LYMPH NODE BIOPSY     TUBAL LIGATION  1990    Current  Medications: Current Outpatient Medications on File Prior to Visit  Medication Sig   aspirin 81 MG EC tablet    atorvastatin (LIPITOR) 80 MG tablet Take 1 tablet (80 mg total) by mouth daily.   clobetasol ointment (TEMOVATE) 0.05 % Apply bid for itching for up to 5 days.  Call office if itching is not relieved after that time.   EPINEPHrine (EPIPEN 2-PAK) 0.3 mg/0.3 mL IJ SOAJ injection Inject 0.3 mg into the muscle as needed for anaphylaxis.   esomeprazole (NEXIUM) 40 MG capsule Take 1 capsule (40 mg total) by mouth daily.   fluticasone (FLONASE) 50 MCG/ACT nasal spray Place into the nose as needed.   hydrochlorothiazide (MICROZIDE) 12.5 MG capsule TAKE 1 CAPSULE BY MOUTH EVERY DAY   ibuprofen (ADVIL) 600 MG  tablet Take 1 tablet (600 mg total) by mouth every 6 (six) hours as needed.   Multiple Vitamin (MULTIVITAMIN) tablet Take 1 tablet by mouth daily.   Vitamin D, Ergocalciferol, (DRISDOL) 1.25 MG (50000 UNIT) CAPS capsule Take 1 capsule (50,000 Units total) by mouth every 7 (seven) days for 8 doses.   No current facility-administered medications on file prior to visit.     Allergies:   Bee venom and Hydrocodone   Social History   Tobacco Use   Smoking status: Never   Smokeless tobacco: Never  Vaping Use   Vaping Use: Never used  Substance Use Topics   Alcohol use: No    Alcohol/week: 0.0 standard drinks of alcohol   Drug use: No    Family History: family history includes Alcohol abuse in her father; Arthritis in her maternal grandmother; Birth defects in her daughter; Breast cancer in her paternal aunt; Cancer in her maternal grandfather and mother; Diabetes in her father; Hearing loss in her brother; Heart disease in her maternal grandmother, paternal aunt, and paternal grandmother; Hyperlipidemia in her brother; Hypertension in her brother and father. both of her grandmothers had congestive heart failure. brother had a stroke due to Covid.  ROS:   Please see the history of present illness. (+) Chest pain (+) myalgia (L arm) All other systems are reviewed and negative.    EKGs/Labs/Other Studies Reviewed:    The following studies were reviewed today: Doppler LE 2023: Summary:  Right: Resting right ankle-brachial index is within normal range. No  evidence of significant right lower extremity arterial disease. The right  toe-brachial index is normal.   Left: Resting left ankle-brachial index is within normal range. No  evidence of significant left lower extremity arterial disease. The left  toe-brachial index is abnormal.   Echo 02/2016:  Left ventricle: The cavity size was normal. There was mild   concentric hypertrophy. Systolic function was normal. The   estimated ejection  fraction was in the range of 60% to 65%. Wall    motion was normal; there were no regional wall motion    abnormalities.  - Mitral valve: There was mild regurgitation.   Nuclear stress test in 10/2014. The ejection fraction is normal. Wall motion is normal. There is no scar or ischemia. This is a low risk scan. Overall the study is normal.  EKG:  EKG is personally reviewed.   04/11/22: Normal Sinus Rhythm at 68 bpm 03/29/21: NSR at 72 bpm  Recent Labs: 11/08/2021: ALT 12 03/17/2022: BUN 17; Creatinine, Ser 0.96; Potassium 3.8; Sodium 142  Recent Lipid Panel    Component Value Date/Time   CHOL 174 03/17/2022 0838   TRIG 71.0 03/17/2022 0838   HDL 66.30  03/17/2022 0838   CHOLHDL 3 03/17/2022 0838   VLDL 14.2 03/17/2022 0838   LDLCALC 94 03/17/2022 0838   LDLCALC 106 (H) 10/07/2019 0815    Physical Exam:    VS:  BP 130/60 (BP Location: Left Arm, Patient Position: Sitting, Cuff Size: Normal)   Pulse 68   Ht 5' 3"$  (1.6 m)   Wt 141 lb (64 kg)   LMP 02/21/2008   SpO2 99%   BMI 24.98 kg/m     Wt Readings from Last 3 Encounters:  04/11/22 141 lb (64 kg)  03/17/22 139 lb 8 oz (63.3 kg)  01/24/22 140 lb 8 oz (63.7 kg)    GEN: Well nourished, well developed in no acute distress HEENT: Normal, moist mucous membranes NECK: No JVD CARDIAC: regular rhythm, normal S1 and S2, no rubs or gallops. No murmur. VASCULAR: Radial and DP pulses 2+ bilaterally.  No carotid bruits RESPIRATORY:  Clear to auscultation without rales, wheezing or rhonchi  ABDOMEN: Soft, non-tender, non-distended MUSCULOSKELETAL:  Ambulates independently SKIN: Warm and dry, no edema NEUROLOGIC:  Alert and oriented x 3. No focal neuro deficits noted. PSYCHIATRIC:  Normal affect    ASSESSMENT:    1. Chest pain of uncertain etiology   2. Essential hypertension   3. Abnormal ankle brachial index (ABI)   4. Pure hypercholesterolemia   5. Counseling on health promotion and disease prevention   6. Cardiac risk  counseling   7. Chest pain, unspecified type      PLAN:    Chest pain -discussed treadmill stress, nuclear stress/lexiscan, and CT coronary angiography. Discussed pros and cons of each, including but not limited to false positive/false negative risk, radiation risk, and risk of IV contrast dye. Based on shared decision making, decision was made to pursue CT coronary angiography. -will give one time dose of metoprolol 2 hours prior to scheduled test -counseled on need to get BMET prior to test -counseled on use of sublingual nitroglycerin and its importance to a good test -reviewed red flag warning signs that need immediate medical attention   Abnormal ABI: performed by home nurse. ABI was low risk, asymptomatic  Palpitations: resolved -has Kardiamobile  Hypertension: well controlled today -continue HCTZ 12.5 mg daily  Hypercholesterolemia: -target pending results of CCTA -last LDL 99 10/27/20 -continue atorvastatin 20 mg daily  Cardiac risk counseling and prevention recommendations: -recommend heart healthy/Mediterranean diet, with whole grains, fruits, vegetable, fish, lean meats, nuts, and olive oil. Limit salt. -recommend moderate walking, 3-5 times/week for 30-50 minutes each session. Aim for at least 150 minutes.week. Goal should be pace of 3 miles/hours, or walking 1.5 miles in 30 minutes -recommend avoidance of tobacco products. Avoid excess alcohol. -ASCVD risk score: The 10-year ASCVD risk score (Arnett DK, et al., 2019) is: 20.6%   Values used to calculate the score:     Age: 67 years     Sex: Female     Is Non-Hispanic African American: Yes     Diabetic: Yes     Tobacco smoker: No     Systolic Blood Pressure: [id number] mmHg     Is BP treated: Yes     HDL Cholesterol: 66.3 mg/dL     Total Cholesterol: 174 mg/dL    Plan for follow up: 4 weeks  Buford Dresser, MD, PhD [place]  [facility]    Medication Adjustments/Labs and Tests Ordered: Current  medicines are reviewed at length with the patient today.  Concerns regarding medicines are outlined above.  Orders Placed This  Encounter  Procedures   CT CORONARY MORPH W/CTA COR W/SCORE W/CA W/CM &/OR WO/CM   Basic metabolic panel   EKG [id number]   Meds ordered this encounter  Medications   metoprolol tartrate (LOPRESSOR) 25 MG tablet    Sig: Take 1 tablet (25 mg total) by mouth once for 1 dose.    Dispense:  1 tablet    Refill:  0    Patient Instructions  Medication Instructions:  Metoprolol 25 mg take one 2 hrs prior to your scheduled CT appt.  *If you need a refill on your cardiac medications before your next appointment, please call your pharmacy*   Lab Work: BMET 1 week before CT  If you have labs (blood work) drawn today and your tests are completely normal, you will receive your results only by: [place] (if you have MyChart) OR A paper copy in the mail If you have any lab test that is abnormal or we need to change your treatment, we will call you to review the results.   Testing/Procedures: .  Your cardiac CT will be scheduled at one of the below locations:   [facility] [address] [place], [place] [address] [phone]   If scheduled at [facility], please arrive at the [facility] and Children's Entrance (Entrance C2) of [facility] 30 minutes prior to test start time. You can use the FREE valet parking offered at entrance C (encouraged to control the heart rate for the test)  Proceed to the [facility] Radiology Department (first floor) to check-in and test prep.  All radiology patients and guests should use entrance C2 at [facility], accessed from [facility], even though the hospital's physical address listed is [address]     Please follow these instructions carefully (unless otherwise directed):  On the Night Before the Test: Be sure to Drink plenty of water. Do not consume any  caffeinated/decaffeinated beverages or chocolate 12 hours prior to your test. Do not take any antihistamines 12 hours prior to your test.  On the Day of the Test: Drink plenty of water until 1 hour prior to the test. Do not eat any food 1 hour prior to test. You may take your regular medications prior to the test.  Take metoprolol (Lopressor) two hours prior to test. FEMALES- please wear underwire-free bra if available, avoid dresses & tight clothing   *For Clinical Staff only. Please instruct patient the following:* Heart Rate Medication Recommendations for Cardiac CT  Resting HR < 50 bpm  No medication  Resting HR 50-60 bpm and BP >110/50 mmHG   Consider Metoprolol tartrate 25 mg PO 90-120 min prior to scan       After the Test: Drink plenty of water. After receiving IV contrast, you may experience a mild flushed feeling. This is normal. On occasion, you may experience a mild rash up to 24 hours after the test. This is not dangerous. If this occurs, you can take Benadryl 25 mg and increase your fluid intake. If you experience trouble breathing, this can be serious. If it is severe call 911 IMMEDIATELY. If it is mild, please call our office. If you take any of these medications: Glipizide/Metformin, Avandament, Glucavance, please do not take 48 hours after completing test unless otherwise instructed.  We will call to schedule your test 2-4 weeks out understanding that some insurance companies will need an authorization prior to the service being performed.   For non-scheduling related  questions, please contact the cardiac imaging nurse navigator should you have any questions/concerns: Marchia Bond, Cardiac Imaging Nurse Navigator Gordy Clement, Cardiac Imaging Nurse Navigator [facility] Direct Office Dial: [phone]   For scheduling needs, including cancellations and rescheduling, please call [place], [phone].    Follow-Up: At [facility], you and your health needs are our priority.  As part of our continuing mission to provide you with exceptional heart care, we have created designated Provider Care Teams.  These Care Teams include your primary Cardiologist (physician) and Advanced Practice Providers (APPs -  Physician Assistants and Nurse Practitioners) who all work together to provide you with the care you need, when you need it.  We recommend signing up for the patient portal called "MyChart".  Sign up information is provided on this After Visit Summary.  MyChart is used to connect with patients for Virtual Visits (Telemedicine).  Patients are able to view lab/test results, encounter notes, upcoming appointments, etc.  Non-urgent messages can be sent to your provider as well.   To learn more about what you can do with MyChart, go to [email or web address].    Your next appointment:   4 week(s)  Provider:   Buford Dresser, MD    Other Instructions None      Burnett Kanaris Ford,acting as a scribe for Buford Dresser, MD.,have documented all relevant documentation on the behalf of Buford Dresser, MD,as directed by  Buford Dresser, MD while in the presence of Buford Dresser, MD.   I, Buford Dresser, MD, have reviewed all documentation for this visit. The documentation on 04/11/22 for the exam, diagnosis, procedures, and orders are all accurate and complete.    Signed, Buford Dresser, MD PhD 04/11/2022   [place]

## 2022-04-17 DIAGNOSIS — R079 Chest pain, unspecified: Secondary | ICD-10-CM | POA: Diagnosis not present

## 2022-04-18 LAB — BASIC METABOLIC PANEL
BUN/Creatinine Ratio: 16 (ref 12–28)
BUN: 14 mg/dL (ref 8–27)
CO2: 25 mmol/L (ref 20–29)
Calcium: 9.3 mg/dL (ref 8.7–10.3)
Chloride: 105 mmol/L (ref 96–106)
Creatinine, Ser: 0.9 mg/dL (ref 0.57–1.00)
Glucose: 106 mg/dL — ABNORMAL HIGH (ref 70–99)
Potassium: 3.9 mmol/L (ref 3.5–5.2)
Sodium: 144 mmol/L (ref 134–144)
eGFR: 70 mL/min/{1.73_m2} (ref >59)

## 2022-04-21 ENCOUNTER — Telehealth (HOSPITAL_COMMUNITY): Payer: Self-pay | Admitting: *Deleted

## 2022-04-21 NOTE — Telephone Encounter (Signed)
Reaching out to patient to offer assistance regarding upcoming cardiac imaging study; pt verbalizes understanding of appt date/time, parking situation and where to check in, pre-test NPO status and medications ordered, and verified current allergies; name and call back number provided for further questions should they arise  Gordy Clement RN Navigator Cardiac Imaging [facility] [phone] office [phone] cell  Patient to take '25mg'$  metoprolol tartrate two hours prior to her cardiac CT scan.  She is aware to arrive at 8am.

## 2022-04-24 ENCOUNTER — Ambulatory Visit (HOSPITAL_COMMUNITY)
Admission: RE | Admit: 2022-04-24 | Discharge: 2022-04-24 | Disposition: A | Discharge status: Home / Self Care | Payer: Medicare Other | Source: Ambulatory Visit | Attending: Cardiology | Admitting: Cardiology

## 2022-04-24 DIAGNOSIS — I700 Atherosclerosis of aorta: Secondary | ICD-10-CM | POA: Insufficient documentation

## 2022-04-24 DIAGNOSIS — K7689 Other specified diseases of liver: Secondary | ICD-10-CM | POA: Diagnosis not present

## 2022-04-24 DIAGNOSIS — R079 Chest pain, unspecified: Secondary | ICD-10-CM | POA: Diagnosis not present

## 2022-04-24 DIAGNOSIS — I2510 Atherosclerotic heart disease of native coronary artery without angina pectoris: Secondary | ICD-10-CM | POA: Insufficient documentation

## 2022-04-24 MED ORDER — NITROGLYCERIN 0.4 MG SL SUBL
0.8000 mg | SUBLINGUAL_TABLET | Freq: Once | SUBLINGUAL | Status: AC
  Administered 2022-04-24: 0.8 mg via SUBLINGUAL

## 2022-04-24 MED ORDER — IOHEXOL 350 MG/ML SOLN
95.0000 mL | Freq: Once | INTRAVENOUS | Status: AC | PRN
  Administered 2022-04-24: 95 mL via INTRAVENOUS

## 2022-04-24 MED ORDER — NITROGLYCERIN 0.4 MG SL SUBL
SUBLINGUAL_TABLET | SUBLINGUAL | Status: AC
  Filled 2022-04-24: qty 2

## 2022-05-03 ENCOUNTER — Other Ambulatory Visit: Payer: Self-pay | Admitting: Family Medicine

## 2022-05-03 DIAGNOSIS — I10 Essential (primary) hypertension: Secondary | ICD-10-CM

## 2022-05-15 NOTE — Progress Notes (Unsigned)
HPI: [name] is a 67 y.o. female, who is here today for follow-up.  She was last seen on 03/17/2022. Since her last visit she has blood work done through a health fair at work, "cardiovascular risk assessment." Hyperlipidemia: Last visit we increased atorvastatin from 40 mg to 80 mg. She has tolerated medication well. She had fasting blood work on 05/06/2022 HDL 68, LDL 42, TC 125, and TG 77.  DM 2: Diagnosed in 2016. Currently she is on nonpharmacologic treatment. She has decreased carb intake, she is eating more fruit and vegetables, regular consumption of red meat and breath.  She has maintain a routine walking and avoidance. Negative for polydipsia, polyuria, polyphagia, foot numbness/ulcers/trauma. She had an A1c done on 05/06/2022, 5.0. Lab Results  Component Value Date   MICROALBUR <0.7 11/08/2021   MICROALBUR <0.7 10/27/2020   Last visit she was complaining of CP, reports that this problem has resolved.  She has seen her cardiologist, 04/11/2022. Since her last visit she had coronary calcium score done, she has an appointment with cardiologist in a few weeks.  Chest CT also revealed mild emphysematous changes and a hepatic cyst, 1.1 cm.  Further follow-up was recommended.  Lab Results  Component Value Date   CREATININE 0.90 04/17/2022   BUN 14 04/17/2022   NA 144 04/17/2022   K 3.9 04/17/2022   CL 105 04/17/2022   CO2 25 04/17/2022   GERD: Last visit Protonix was estopped and Nexium 40 mg started. She reported great improvement of symptoms, she is no longer having heartburn or acid reflux. She also made some dietary changes and try to avoid foods that aggravate problem. Negative for abdominal pain, nausea, vomiting, changes in bowel habits, or melena.   Osteoporosis: She tried Fosamax, discontinued because of nausea. Prolia was recommended but it is expensive. She is currently on calcium and vitamin D supplementation, she completed ergocalciferol 50,000  units weekly x 8 weeks and currently taking 2000 units daily. Last bone density was done on 03/08/2022, osteoporosis.  She also needs a refill for an EpiPen, allergy to bee yellowjacket sting.  She has not had an event that she needed to use EpiPen in the past.  Review of Systems  Constitutional:  Negative for chills and fever.  HENT:  Negative for sore throat and voice change.   Respiratory:  Negative for cough, shortness of breath and wheezing.   Cardiovascular:  Negative for palpitations and leg swelling.  Genitourinary:  Negative for decreased urine volume, dysuria and hematuria.  Neurological:  Negative for syncope, weakness and headaches.  See other pertinent positives and negatives in HPI.  Current Outpatient Medications on File Prior to Visit  Medication Sig Dispense Refill   aspirin 81 MG EC tablet      atorvastatin (LIPITOR) 80 MG tablet Take 1 tablet (80 mg total) by mouth daily. 90 tablet 3   clobetasol ointment (TEMOVATE) 0.05 % Apply bid for itching for up to 5 days.  Call office if itching is not relieved after that time. 30 g 2   fluticasone (FLONASE) 50 MCG/ACT nasal spray Place into the nose as needed.     hydrochlorothiazide (MICROZIDE) 12.5 MG capsule TAKE 1 CAPSULE BY MOUTH EVERY DAY 90 capsule 3   ibuprofen (ADVIL) 600 MG tablet Take 1 tablet (600 mg total) by mouth every 6 (six) hours as needed. 30 tablet 0   Multiple Vitamin (MULTIVITAMIN) tablet Take 1 tablet by mouth daily.     No current facility-administered medications  on file prior to visit.   Past Medical History:  Diagnosis Date   Abnormal Pap smear of cervix    neg HPV HR+ 16/18 neg 2017, 2018 LGSIL HPV-, 2019 ASCUS HPV HR+, 08-21-2018 LGSIL HPV HR-   Anxiety    Depression    Diabetes mellitus without complication (HCC)    Dyspareunia    History of colon polyps    Hyperlipidemia    Hypertension    Lichen sclerosus    Thyroid disease    Allergies  Allergen Reactions   Bee Venom Anaphylaxis     Carries Epi pen    Hydrocodone Nausea And Vomiting    Social History   Socioeconomic History   Marital status: Married    Spouse name: Not on file   Number of children: 2   Years of education: Not on file   Highest education level: Some college, no degree  Occupational History   Not on file  Tobacco Use   Smoking status: Never   Smokeless tobacco: Never  Vaping Use   Vaping Use: Never used  Substance and Sexual Activity   Alcohol use: No    Alcohol/week: 0.0 standard drinks of alcohol   Drug use: No   Sexual activity: Yes    Partners: Male    Birth control/protection: Surgical    Comment: Tubal ligation  Other Topics Concern   Not on file  Social History Narrative   Not on file   Social Determinants of Health   Financial Resource Strain: Low Risk  (11/04/2021)   Overall Financial Resource Strain (CARDIA)    Difficulty of Paying Living Expenses: Not hard at all  Food Insecurity: No Food Insecurity (11/04/2021)   Hunger Vital Sign    Worried About Running Out of Food in the Last Year: Never true    Ran Out of Food in the Last Year: Never true  Transportation Needs: No Transportation Needs (11/04/2021)   PRAPARE - Hydrologist (Medical): No    Lack of Transportation (Non-Medical): No  Physical Activity: Insufficiently Active (11/04/2021)   Exercise Vital Sign    Days of Exercise per Week: 2 days    Minutes of Exercise per Session: 30 min  Stress: Stress Concern Present (11/04/2021)   [place]    Feeling of Stress : To some extent  Social Connections: Socially Integrated (11/04/2021)   Social Connection and Isolation Panel [NHANES]    Frequency of Communication with Friends and Family: More than three times a week    Frequency of Social Gatherings with Friends and Family: More than three times a week    Attends Religious Services: More than 4 times per year    Active Member  of Clubs or Organizations: Yes    Attends Archivist Meetings: More than 4 times per year    Marital Status: Married   Vitals:   05/16/22 0651  BP: 126/70  Pulse: 70  Resp: 16  Temp: 97.7 F (36.5 C)  SpO2: 99%   Body mass index is 25.02 kg/m.  Physical Exam Vitals and nursing note reviewed.  Constitutional:      General: She is not in acute distress.    Appearance: She is well-developed and well-groomed.  HENT:     Head: Normocephalic and atraumatic.     Mouth/Throat:     Mouth: Mucous membranes are moist.     Pharynx: Oropharynx is clear.  Eyes:  Conjunctiva/sclera: Conjunctivae normal.  Cardiovascular:     Rate and Rhythm: Normal rate and regular rhythm.     Pulses:          Dorsalis pedis pulses are 2+ on the right side and 2+ on the left side.     Heart sounds: No murmur heard. Pulmonary:     Effort: Pulmonary effort is normal. No respiratory distress.     Breath sounds: Normal breath sounds.  Chest:     Chest wall: No tenderness.  Abdominal:     Palpations: Abdomen is soft. There is no hepatomegaly or mass.     Tenderness: There is no abdominal tenderness.  Skin:    General: Skin is warm.     Findings: No erythema or rash.  Neurological:     General: No focal deficit present.     Mental Status: She is alert and oriented to person, place, and time.     Cranial Nerves: No cranial nerve deficit.     Gait: Gait normal.  Psychiatric:        Mood and Affect: Mood and affect normal.   ASSESSMENT AND PLAN:  Ms. Sibley was seen today for medical management of chronic issues.  Diagnoses and all orders for this visit:  Type 2 diabetes mellitus with other specified complication, without long-term current use of insulin ([place]) Assessment & Plan: Recent A1C 5.0, done in a health fair through work 05/06/22. Continue nonpharmacologic treatment. Annual eye exam, periodic dental and foot care recommended. F/U in 5-6 months.   Bee sting  allergy Assessment & Plan: EpiPen sent to her pharmacy.  Orders: -     EPINEPHrine; Inject 0.3 mg into the muscle as needed for anaphylaxis.  Dispense: 1 each; Refill: 1  Vitamin D deficiency, unspecified Assessment & Plan: Completed ergocalciferol 50,000 units weekly x 8 weeks. Continue vitamin D 2000 units daily.   Localized osteoporosis without current pathological fracture Assessment & Plan: We reviewed last DEXA done in 02/2022 as well as treatment options and some side effects or each medication. Fosamax caused nausea in the past and she cannot afford Prolia. She agrees with trying Actonel 150 mg monthly. Continue adequate vitamin D and Serum supplementation. Fall precautions. Continue regular physical activity, including weightbearing exercises. DEXA in 2 to 3 years.  Orders: -     Risedronate Sodium; Take 1 tablet (150 mg total) by mouth every 30 (thirty) days. with water on empty stomach, nothing by mouth or lie down for next 30 minutes.  Dispense: 3 tablet; Refill: 3  Hyperlipidemia, unspecified hyperlipidemia type Assessment & Plan: Improved. LDL on 05/06/2022 was 42 (94 in 02/2022). Continue atorvastatin 80 mg daily and low-fat diet.   Gastroesophageal reflux disease without esophagitis Assessment & Plan: Symptoms greatly improved. Continue Nexium 40 mg daily and GERD precautions.  Orders: -     Esomeprazole Magnesium; Take 1 capsule (40 mg total) by mouth daily.  Dispense: 90 capsule; Refill: 3  I spent a total of 41 minutes in both face to face and non face to face activities for this visit on the date of this encounter. During this time history was obtained and documented, examination was performed, prior labs/imaging reviewed, and assessment/plan discussed.  Return in about 6 months (around 11/16/2022) for chronic problems.  Shanya Ferriss G. [place], MD  [facility]. [place] office.

## 2022-05-16 ENCOUNTER — Encounter: Payer: Self-pay | Admitting: Family Medicine

## 2022-05-16 ENCOUNTER — Ambulatory Visit (INDEPENDENT_AMBULATORY_CARE_PROVIDER_SITE_OTHER): Payer: Medicare Other | Admitting: Family Medicine

## 2022-05-16 VITALS — BP 126/70 | HR 70 | Temp 97.7°F | Resp 16 | Ht 63.0 in | Wt 141.2 lb

## 2022-05-16 DIAGNOSIS — E1169 Type 2 diabetes mellitus with other specified complication: Secondary | ICD-10-CM

## 2022-05-16 DIAGNOSIS — E559 Vitamin D deficiency, unspecified: Secondary | ICD-10-CM | POA: Diagnosis not present

## 2022-05-16 DIAGNOSIS — K219 Gastro-esophageal reflux disease without esophagitis: Secondary | ICD-10-CM

## 2022-05-16 DIAGNOSIS — M816 Localized osteoporosis [Lequesne]: Secondary | ICD-10-CM

## 2022-05-16 DIAGNOSIS — E785 Hyperlipidemia, unspecified: Secondary | ICD-10-CM

## 2022-05-16 DIAGNOSIS — Z91030 Bee allergy status: Secondary | ICD-10-CM

## 2022-05-16 MED ORDER — RISEDRONATE SODIUM 150 MG PO TABS: 150.0000 mg | ORAL_TABLET | ORAL | 3 refills | Status: DC

## 2022-05-16 MED ORDER — ESOMEPRAZOLE MAGNESIUM 40 MG PO CPDR: 40.0000 mg | DELAYED_RELEASE_CAPSULE | Freq: Every day | ORAL | 3 refills | Status: DC

## 2022-05-16 MED ORDER — EPINEPHRINE 0.3 MG/0.3ML IJ SOAJ: 0.3000 mg | INTRAMUSCULAR | 1 refills | Status: AC | PRN

## 2022-05-16 NOTE — Patient Instructions (Addendum)
A few things to remember from today's visit:  Vitamin D deficiency, unspecified  Bee sting allergy - Plan: EPINEPHrine (EPIPEN 2-PAK) 0.3 mg/0.3 mL IJ SOAJ injection  Localized osteoporosis without current pathological fracture - Plan: risedronate (ACTONEL) 150 MG tablet  Hyperlipidemia, unspecified hyperlipidemia type  Continue vit D supplementation. Actonel started today. We can repeat bone density in 2-3 years.  If you need refills for medications you take chronically, please call your pharmacy. Do not use My Chart to request refills or for acute issues that need immediate attention. If you send a my chart message, it may take a few days to be addressed, specially if I am not in the office.  Please be sure medication list is accurate. If a new problem present, please set up appointment sooner than planned today.

## 2022-05-16 NOTE — Assessment & Plan Note (Addendum)
Recent A1C 5.0, done in a health fair through work 05/06/22. Continue nonpharmacologic treatment. Annual eye exam, periodic dental and foot care recommended. F/U in 5-6 months.

## 2022-05-16 NOTE — Assessment & Plan Note (Addendum)
Symptoms greatly improved. Continue Nexium 40 mg daily and GERD precautions.

## 2022-05-16 NOTE — Assessment & Plan Note (Signed)
Improved. LDL on 05/06/2022 was 42 (94 in 02/2022). Continue atorvastatin 80 mg daily and low-fat diet.

## 2022-05-16 NOTE — Assessment & Plan Note (Signed)
EpiPen sent to her pharmacy.

## 2022-05-16 NOTE — Assessment & Plan Note (Addendum)
We reviewed last DEXA done in 02/2022 as well as treatment options and some side effects or each medication. Fosamax caused nausea in the past and she cannot afford Prolia. She agrees with trying Actonel 150 mg monthly. Continue adequate vitamin D and Serum supplementation. Fall precautions. Continue regular physical activity, including weightbearing exercises. DEXA in 2 to 3 years.

## 2022-05-16 NOTE — Assessment & Plan Note (Signed)
Completed ergocalciferol 50,000 units weekly x 8 weeks. Continue vitamin D 2000 units daily.

## 2022-05-29 ENCOUNTER — Ambulatory Visit
Admission: EM | Admit: 2022-05-29 | Discharge: 2022-05-29 | Disposition: A | Discharge status: Home / Self Care | Payer: Medicare Other | Attending: Urgent Care | Admitting: Urgent Care

## 2022-05-29 ENCOUNTER — Ambulatory Visit (HOSPITAL_BASED_OUTPATIENT_CLINIC_OR_DEPARTMENT_OTHER): Payer: Medicare Other | Admitting: Cardiology

## 2022-05-29 ENCOUNTER — Encounter (HOSPITAL_BASED_OUTPATIENT_CLINIC_OR_DEPARTMENT_OTHER): Payer: Self-pay | Admitting: Cardiology

## 2022-05-29 VITALS — BP 132/80 | HR 80 | Ht 63.0 in | Wt 138.2 lb

## 2022-05-29 DIAGNOSIS — M810 Age-related osteoporosis without current pathological fracture: Secondary | ICD-10-CM | POA: Diagnosis not present

## 2022-05-29 DIAGNOSIS — Z712 Person consulting for explanation of examination or test findings: Secondary | ICD-10-CM

## 2022-05-29 DIAGNOSIS — Z7189 Other specified counseling: Secondary | ICD-10-CM | POA: Diagnosis not present

## 2022-05-29 DIAGNOSIS — I2510 Atherosclerotic heart disease of native coronary artery without angina pectoris: Secondary | ICD-10-CM

## 2022-05-29 DIAGNOSIS — R197 Diarrhea, unspecified: Secondary | ICD-10-CM

## 2022-05-29 DIAGNOSIS — R110 Nausea: Secondary | ICD-10-CM | POA: Diagnosis not present

## 2022-05-29 DIAGNOSIS — E7800 Pure hypercholesterolemia, unspecified: Secondary | ICD-10-CM

## 2022-05-29 DIAGNOSIS — R109 Unspecified abdominal pain: Secondary | ICD-10-CM | POA: Diagnosis not present

## 2022-05-29 DIAGNOSIS — I10 Essential (primary) hypertension: Secondary | ICD-10-CM

## 2022-05-29 MED ORDER — ONDANSETRON 4 MG PO TBDP: 4.0000 mg | ORAL_TABLET | Freq: Three times a day (TID) | ORAL | 0 refills | Status: DC | PRN

## 2022-05-29 MED ORDER — LOPERAMIDE HCL 2 MG PO CAPS: 2.0000 mg | ORAL_CAPSULE | Freq: Two times a day (BID) | ORAL | 0 refills | Status: DC | PRN

## 2022-05-29 NOTE — Progress Notes (Signed)
Cardiology Office Note:    Date:  05/29/2022   ID:  Nancy, Chandler May 20, 1955, MRN [id number]  PCP:  [place], Betty G, MD  Cardiologist:  Jodelle Red, MD  Referring MD: [place], Betty G, MD   CC: follow up  History of Present Illness:    Nancy Chandler is a 67 y.o. female with a hx of nonobstructive CAD, hypertension, who is seen for follow up. I initially met her 04/30/19 as a new consult at the request of [place], Timoteo Expose, MD for the evaluation and management of palpitations.  At her previous appointment on 03/29/2021 she reported that she had been doing well. She had been following up with GI for GERD symptoms. She had a home health nurse through her insurance and had been told that the circulation in her leg was not good.  Per Dr. Elvis Coil note, home readings note ABI on her RLE 0.49, LLE 0.94. She had considerable anxiety about her leg since then. Noted good pulses except for right PT at that visit.   At her last appointment, she complained of upper left chest pain not resolved with her esomeprazole. No known triggers, correlations, or alleviating factors. She was still able to walk and carry out her daily activities without limitation from the pain. Based on shared decision making, decision was made to pursue CT coronary angiography. In 04/2022 her coronary CTA revealed a coronary calcium score of 7.02 (53rd percentile). There was minimal calcification without obstructive CAD, and aortic atherosclerosis. Significant motion/mis-registration artifact was noted.  Today, she reports taking a new medication and struggling with some GI side effects including pain and diarrhea since starting her Actonel last Wednesday. This has been affecting her diet. Advised her to discuss this with her PCP.  Otherwise, she denies any new concerns from a cardiovascular perspective. We reviewed the results of her coronary CTA in detail.  She presents a copy of recent lipid panel results  performed through her work. As of 04/2022 her LDL has improved to 42. Triglycerides 77, HDL 68.  She denies any palpitations, chest pain, shortness of breath, or peripheral edema. No lightheadedness, headaches, syncope, orthopnea, or PND.   Past Medical History:  Diagnosis Date   Abnormal Pap smear of cervix    neg HPV HR+ 16/18 neg 2017, 2018 LGSIL HPV-, 2019 ASCUS HPV HR+, 08-21-2018 LGSIL HPV HR-   Anxiety    Depression    Diabetes mellitus without complication    Dyspareunia    History of colon polyps    Hyperlipidemia    Hypertension    Lichen sclerosus    Thyroid disease     Past Surgical History:  Procedure Laterality Date   COLPOSCOPY  2010, 2018, 2019   History of LSIL, ASCUS + HPV   LYMPH NODE BIOPSY     TUBAL LIGATION  1990    Current Medications: Current Outpatient Medications on File Prior to Visit  Medication Sig   aspirin 81 MG EC tablet    atorvastatin (LIPITOR) 80 MG tablet Take 1 tablet (80 mg total) by mouth daily.   clobetasol ointment (TEMOVATE) 0.05 % Apply bid for itching for up to 5 days.  Call office if itching is not relieved after that time.   EPINEPHrine (EPIPEN 2-PAK) 0.3 mg/0.3 mL IJ SOAJ injection Inject 0.3 mg into the muscle as needed for anaphylaxis.   esomeprazole (NEXIUM) 40 MG capsule Take 1 capsule (40 mg total) by mouth daily.   fluticasone (FLONASE) 50 MCG/ACT nasal spray  Place into the nose as needed.   hydrochlorothiazide (MICROZIDE) 12.5 MG capsule TAKE 1 CAPSULE BY MOUTH EVERY DAY   ibuprofen (ADVIL) 600 MG tablet Take 1 tablet (600 mg total) by mouth every 6 (six) hours as needed.   Multiple Vitamin (MULTIVITAMIN) tablet Take 1 tablet by mouth daily.   risedronate (ACTONEL) 150 MG tablet Take 1 tablet (150 mg total) by mouth every 30 (thirty) days. with water on empty stomach, nothing by mouth or lie down for next 30 minutes.   No current facility-administered medications on file prior to visit.     Allergies:   Bee venom and  Hydrocodone   Social History   Tobacco Use   Smoking status: Never   Smokeless tobacco: Never  Vaping Use   Vaping Use: Never used  Substance Use Topics   Alcohol use: No    Alcohol/week: 0.0 standard drinks of alcohol   Drug use: No    Family History: family history includes Alcohol abuse in her father; Arthritis in her maternal grandmother; Birth defects in her daughter; Breast cancer in her paternal aunt; Cancer in her maternal grandfather and mother; Diabetes in her father; Hearing loss in her brother; Heart disease in her maternal grandmother, paternal aunt, and paternal grandmother; Hyperlipidemia in her brother; Hypertension in her brother and father. both of her grandmothers had congestive heart failure. brother had a stroke due to Covid.  ROS:   Please see the history of present illness. (+)  GI side effects All other systems are reviewed and negative.    EKGs/Labs/Other Studies Reviewed:    The following studies were reviewed today:  Coronary CTA  04/24/2022: IMPRESSION: 1. Significant motion/mis-registration artifact.   2. Minimal calcification without obstructive CAD, CADRADS = 1.   3. Coronary calcium score of 7.02. This was 53rd percentile for age and sex matched control.   4. Normal coronary origin with right dominance.   5. Aortic atherosclerosis.  ABI Doppler LE 2023: Summary:  Right: Resting right ankle-brachial index is within normal range. No  evidence of significant right lower extremity arterial disease. The right  toe-brachial index is normal.   Left: Resting left ankle-brachial index is within normal range. No  evidence of significant left lower extremity arterial disease. The left  toe-brachial index is abnormal.   Echo 02/2016:  Left ventricle: The cavity size was normal. There was mild   concentric hypertrophy. Systolic function was normal. The   estimated ejection fraction was in the range of 60% to 65%. Wall    motion was normal; there  were no regional wall motion    abnormalities.  - Mitral valve: There was mild regurgitation.   Nuclear stress test in 10/2014. The ejection fraction is normal. Wall motion is normal. There is no scar or ischemia. This is a low risk scan. Overall the study is normal.  EKG:  EKG is personally reviewed.   05/29/2022:  EKG was not ordered. 04/11/22: Normal Sinus Rhythm at 68 bpm 03/29/21: NSR at 72 bpm  Recent Labs: 11/08/2021: ALT 12 04/17/2022: BUN 14; Creatinine, Ser 0.90; Potassium 3.9; Sodium 144   Recent Lipid Panel    Component Value Date/Time   CHOL 174 03/17/2022 0838   TRIG 71.0 03/17/2022 0838   HDL 66.30 03/17/2022 0838   CHOLHDL 3 03/17/2022 0838   VLDL 14.2 03/17/2022 0838   LDLCALC 94 03/17/2022 0838   LDLCALC 106 (H) 10/07/2019 0815    Physical Exam:    VS:  BP 132/80  Pulse 80   Ht 5\' 3"  (1.6 m)   Wt 138 lb 3.2 oz (62.7 kg)   LMP 02/21/2008   BMI 24.48 kg/m     Wt Readings from Last 3 Encounters:  05/29/22 138 lb 3.2 oz (62.7 kg)  05/16/22 141 lb 4 oz (64.1 kg)  04/11/22 141 lb (64 kg)    GEN: Well nourished, well developed in no acute distress HEENT: Normal, moist mucous membranes NECK: No JVD CARDIAC: regular rhythm, normal S1 and S2, no rubs or gallops. No murmur. VASCULAR: Radial and DP pulses 2+ bilaterally.  No carotid bruits RESPIRATORY:  Clear to auscultation without rales, wheezing or rhonchi  ABDOMEN: Soft, non-tender, non-distended MUSCULOSKELETAL:  Ambulates independently SKIN: Warm and dry, no edema NEUROLOGIC:  Alert and oriented x 3. No focal neuro deficits noted. PSYCHIATRIC:  Normal affect    ASSESSMENT:    1. Nonocclusive coronary atherosclerosis of native coronary artery   2. Essential hypertension   3. Pure hypercholesterolemia   4. Encounter to discuss test results   5. Counseling on health promotion and disease prevention   6. Cardiac risk counseling     PLAN:    Chest pain, resolved Nonobstructive  CAD Hypercholesterolemia -reviewed CCTA, reassuring. No significant stenosis, very small amount of plaque -continue atorvastatin, aspirin. Last LDL 42. Goal <70 -reviewed red flag warning signs that need immediate medical attention  Abnormal ABI:  -ABI was low risk (abnl left great toe), asymptomatic  Palpitations: resolved -has Kardiamobile  Hypertension: well controlled today -continue HCTZ 12.5 mg daily  Cardiac risk counseling and prevention recommendations: -recommend heart healthy/Mediterranean diet, with whole grains, fruits, vegetable, fish, lean meats, nuts, and olive oil. Limit salt. -recommend moderate walking, 3-5 times/week for 30-50 minutes each session. Aim for at least 150 minutes.week. Goal should be pace of 3 miles/hours, or walking 1.5 miles in 30 minutes -recommend avoidance of tobacco products. Avoid excess alcohol.  Plan for follow up: 1 year, or sooner as needed, per patient preference.  Jodelle Red, MD, PhD [place]  [facility]    Medication Adjustments/Labs and Tests Ordered: Current medicines are reviewed at length with the patient today.  Concerns regarding medicines are outlined above.   No orders of the defined types were placed in this encounter.  No orders of the defined types were placed in this encounter.  Patient Instructions  Medication Instructions:  Your physician recommends that you continue on your current medications as directed. Please refer to the Current Medication list given to you today.  *If you need a refill on your cardiac medications before your next appointment, please call your pharmacy*  Lab Work: NONE  Testing/Procedures: NONE  Follow-Up: At [facility], you and your health needs are our priority.  As part of our continuing mission to provide you with exceptional heart care, we have created designated Provider Care Teams.  These Care Teams include your primary Cardiologist (physician) and  Advanced Practice Providers (APPs -  Physician Assistants and Nurse Practitioners) who all work together to provide you with the care you need, when you need it.  We recommend signing up for the patient portal called "MyChart".  Sign up information is provided on this After Visit Summary.  MyChart is used to connect with patients for Virtual Visits (Telemedicine).  Patients are able to view lab/test results, encounter notes, upcoming appointments, etc.  Non-urgent messages can be sent to your provider as well.   To learn more about what you can do with MyChart,  go to [email or web address].    Your next appointment:   12 month(s)  The format for your next appointment:   In Person  Provider:   Jodelle Red, MD       [facility],acting as a scribe for Jodelle Red, MD.,have documented all relevant documentation on the behalf of Jodelle Red, MD,as directed by  Jodelle Red, MD while in the presence of Jodelle Red, MD.  I, Jodelle Red, MD, have reviewed all documentation for this visit. The documentation on 05/29/22 for the exam, diagnosis, procedures, and orders are all accurate and complete.   Signed, Jodelle Red, MD PhD 05/29/2022   [facility]

## 2022-05-29 NOTE — ED Triage Notes (Signed)
Pt reports abdominal cramps, diarrhea x 5 days. Reports diarrhea started the same day she started risedronate.

## 2022-05-29 NOTE — Discharge Instructions (Addendum)
Please follow up with your PCP to see if risedronate is the right medication for you. Another alternative would be alendronate. For now use ondansetron and/or loperamide to help you nausea, vomiting, diarrhea. If you develop fever, bloody diarrhea, we need to see you back.

## 2022-05-29 NOTE — ED Provider Notes (Signed)
[facility]  Note:  This document was prepared using Conservation officer, historic buildings and may include unintentional dictation errors.  MRN: [id number] DOB: September 08, 1955  Subjective:   Nancy Chandler is a 67 y.o. female presenting for 5-day history of acute onset persistent abdominal cramping, nausea without vomiting, diarrhea.  Reports that symptoms are very closely associated with taking risedronate.  No fever, bloody stools, recent antibiotic use, hospitalizations or long distance travel.  Has not eaten raw foods, drank unfiltered water.  No history of GI disorders including Crohn's, IBS, ulcerative colitis.  Has a history of colonic polyps but no other GI history.   No current facility-administered medications for this encounter.  Current Outpatient Medications:    aspirin 81 MG EC tablet, , Disp: , Rfl:    atorvastatin (LIPITOR) 80 MG tablet, Take 1 tablet (80 mg total) by mouth daily., Disp: 90 tablet, Rfl: 3   clobetasol ointment (TEMOVATE) 0.05 %, Apply bid for itching for up to 5 days.  Call office if itching is not relieved after that time., Disp: 30 g, Rfl: 2   EPINEPHrine (EPIPEN 2-PAK) 0.3 mg/0.3 mL IJ SOAJ injection, Inject 0.3 mg into the muscle as needed for anaphylaxis., Disp: 1 each, Rfl: 1   esomeprazole (NEXIUM) 40 MG capsule, Take 1 capsule (40 mg total) by mouth daily., Disp: 90 capsule, Rfl: 3   fluticasone (FLONASE) 50 MCG/ACT nasal spray, Place into the nose as needed., Disp: , Rfl:    hydrochlorothiazide (MICROZIDE) 12.5 MG capsule, TAKE 1 CAPSULE BY MOUTH EVERY DAY, Disp: 90 capsule, Rfl: 3   ibuprofen (ADVIL) 600 MG tablet, Take 1 tablet (600 mg total) by mouth every 6 (six) hours as needed., Disp: 30 tablet, Rfl: 0   Multiple Vitamin (MULTIVITAMIN) tablet, Take 1 tablet by mouth daily., Disp: , Rfl:    risedronate (ACTONEL) 150 MG tablet, Take 1 tablet (150 mg total) by mouth every 30 (thirty) days. with water on empty stomach, nothing  by mouth or lie down for next 30 minutes., Disp: 3 tablet, Rfl: 3   Allergies  Allergen Reactions   Bee Venom Anaphylaxis    Carries Epi pen    Hydrocodone Nausea And Vomiting    Past Medical History:  Diagnosis Date   Abnormal Pap smear of cervix    neg HPV HR+ 16/18 neg 2017, 2018 LGSIL HPV-, 2019 ASCUS HPV HR+, 08-21-2018 LGSIL HPV HR-   Anxiety    Depression    Diabetes mellitus without complication    Dyspareunia    History of colon polyps    Hyperlipidemia    Hypertension    Lichen sclerosus    Thyroid disease      Past Surgical History:  Procedure Laterality Date   COLPOSCOPY  2010, 2018, 2019   History of LSIL, ASCUS + HPV   LYMPH NODE BIOPSY     TUBAL LIGATION  1990    Family History  Problem Relation Age of Onset   Hypertension Brother    Hearing loss Brother    Hyperlipidemia Brother    Cancer Mother        colon   Hypertension Father    Alcohol abuse Father    Diabetes Father    Heart disease Maternal Grandmother        CHF   Arthritis Maternal Grandmother    Cancer Maternal Grandfather        kidney   Heart disease Paternal Grandmother  CHF   Birth defects Daughter    Breast cancer Paternal Aunt    Heart disease Paternal Aunt        open heart surgery    Social History   Tobacco Use   Smoking status: Never   Smokeless tobacco: Never  Vaping Use   Vaping Use: Never used  Substance Use Topics   Alcohol use: No    Alcohol/week: 0.0 standard drinks of alcohol   Drug use: No    ROS   Objective:   Vitals: BP (!) 147/68 (BP Location: Right Arm)   Pulse 81   Temp 98.2 F (36.8 C) (Oral)   Resp 16   LMP 02/21/2008   SpO2 98%   Physical Exam Constitutional:      General: She is not in acute distress.    Appearance: Normal appearance. She is well-developed. She is not ill-appearing, toxic-appearing or diaphoretic.  HENT:     Head: Normocephalic and atraumatic.     Nose: Nose normal.     Mouth/Throat:     Mouth: Mucous  membranes are moist.  Eyes:     General: No scleral icterus.       Right eye: No discharge.        Left eye: No discharge.     Extraocular Movements: Extraocular movements intact.     Conjunctiva/sclera: Conjunctivae normal.  Cardiovascular:     Rate and Rhythm: Normal rate.  Pulmonary:     Effort: Pulmonary effort is normal.  Abdominal:     General: Bowel sounds are normal. There is no distension.     Palpations: Abdomen is soft. There is no mass.     Tenderness: There is no abdominal tenderness. There is no right CVA tenderness, left CVA tenderness, guarding or rebound.  Skin:    General: Skin is warm and dry.  Neurological:     General: No focal deficit present.     Mental Status: She is alert and oriented to person, place, and time.  Psychiatric:        Mood and Affect: Mood normal.        Behavior: Behavior normal.        Thought Content: Thought content normal.        Judgment: Judgment normal.     Assessment and Plan :   PDMP not reviewed this encounter.  1. Nausea without vomiting   2. Diarrhea, unspecified type   3. Abdominal cramps   4. Osteoporosis, unspecified osteoporosis type, unspecified pathological fracture presence     I do believe that patient is having adverse effects from her risedronate.  Advised supportive care, follow-up with her PCP to discuss alternatives such as alendronate.  Counseled patient on potential for adverse effects with medications prescribed/recommended today, ER and return-to-clinic precautions discussed, patient verbalized understanding.    Wallis Bamberg, PA-C 05/29/22 1550

## 2022-05-29 NOTE — Patient Instructions (Signed)
Medication Instructions:  Your physician recommends that you continue on your current medications as directed. Please refer to the Current Medication list given to you today.  *If you need a refill on your cardiac medications before your next appointment, please call your pharmacy*  Lab Work: NONE  Testing/Procedures: NONE  Follow-Up: At [facility], you and your health needs are our priority.  As part of our continuing mission to provide you with exceptional heart care, we have created designated Provider Care Teams.  These Care Teams include your primary Cardiologist (physician) and Advanced Practice Providers (APPs -  Physician Assistants and Nurse Practitioners) who all work together to provide you with the care you need, when you need it.  We recommend signing up for the patient portal called "MyChart".  Sign up information is provided on this After Visit Summary.  MyChart is used to connect with patients for Virtual Visits (Telemedicine).  Patients are able to view lab/test results, encounter notes, upcoming appointments, etc.  Non-urgent messages can be sent to your provider as well.   To learn more about what you can do with MyChart, go to [email or web address].    Your next appointment:   12 month(s)  The format for your next appointment:   In Person  Provider:   Bridgette Christopher, MD     

## 2022-05-30 NOTE — Progress Notes (Signed)
Chief Complaint  Patient presents with   Follow-up    Urgent care visit    HPI: [name] is a 67 y.o. female, who is here today to follow on recent urgent care visit. Evaluated in the ED on 05/29/2022 because of abdominal pain and diarrhea. Upper abdominal pain and diarrhea, which began subsequent to the administration of Actonel.  Gradual onset, abdominal pain is characterized by the patient as akin to gas pains, and she notes a progressive worsening of the diarrhea. Additionally, she reports experiencing generalized body aches, muscle soreness, and stiffness, which have significantly impeded her mobility, although no limitation of ROM, joint edema or erythema.  Since took Imodium once yesterday morning and has not had diarrhea since then. She continues to experience a "heavy" sensation in her upper abdomen. She reports some gas passage, which helps to alleviate abdominal pain. She denies any ongoing nausea and indicates that her urine output remains normal. No associate fever or chills and no known sick contact. Symptoms are gradually improving.  Last bone density was done on 03/08/2022, osteoporosis.  She has previously attempted treatment with Fosamax, it caused nausea. Prolia was recommended but due to cost, we decided to try Actonel first.  Review of Systems  Constitutional:  Positive for fatigue.  HENT:  Negative for sore throat and trouble swallowing.   Respiratory:  Negative for cough, shortness of breath and wheezing.   Cardiovascular:  Negative for chest pain.  Gastrointestinal:  Negative for blood in stool and vomiting.  Endocrine: Negative for cold intolerance and heat intolerance.  Genitourinary:  Negative for decreased urine volume, dysuria and hematuria.  Skin:  Negative for rash.  Neurological:  Negative for syncope and headaches.  See other pertinent positives and negatives in HPI.  Current Outpatient Medications on File Prior to Visit  Medication Sig  Dispense Refill   aspirin 81 MG EC tablet      atorvastatin (LIPITOR) 80 MG tablet Take 1 tablet (80 mg total) by mouth daily. 90 tablet 3   clobetasol ointment (TEMOVATE) 0.05 % Apply bid for itching for up to 5 days.  Call office if itching is not relieved after that time. 30 g 2   EPINEPHrine (EPIPEN 2-PAK) 0.3 mg/0.3 mL IJ SOAJ injection Inject 0.3 mg into the muscle as needed for anaphylaxis. 1 each 1   esomeprazole (NEXIUM) 40 MG capsule Take 1 capsule (40 mg total) by mouth daily. 90 capsule 3   fluticasone (FLONASE) 50 MCG/ACT nasal spray Place into the nose as needed.     hydrochlorothiazide (MICROZIDE) 12.5 MG capsule TAKE 1 CAPSULE BY MOUTH EVERY DAY 90 capsule 3   ibuprofen (ADVIL) 600 MG tablet Take 1 tablet (600 mg total) by mouth every 6 (six) hours as needed. 30 tablet 0   loperamide (IMODIUM) 2 MG capsule Take 1 capsule (2 mg total) by mouth 2 (two) times daily as needed for diarrhea or loose stools. 14 capsule 0   Multiple Vitamin (MULTIVITAMIN) tablet Take 1 tablet by mouth daily.     No current facility-administered medications on file prior to visit.   Past Medical History:  Diagnosis Date   Abnormal Pap smear of cervix    neg HPV HR+ 16/18 neg 2017, 2018 LGSIL HPV-, 2019 ASCUS HPV HR+, 08-21-2018 LGSIL HPV HR-   Anxiety    Depression    Diabetes mellitus without complication    Dyspareunia    History of colon polyps    Hyperlipidemia    Hypertension  Lichen sclerosus    Thyroid disease    Allergies  Allergen Reactions   Bee Venom Anaphylaxis    Carries Epi pen    Actonel [Risedronate] Diarrhea and Other (See Comments)   Hydrocodone Nausea And Vomiting    Social History   Socioeconomic History   Marital status: Married    Spouse name: Not on file   Number of children: 2   Years of education: Not on file   Highest education level: Some college, no degree  Occupational History   Not on file  Tobacco Use   Smoking status: Never   Smokeless tobacco:  Never  Vaping Use   Vaping Use: Never used  Substance and Sexual Activity   Alcohol use: No    Alcohol/week: 0.0 standard drinks of alcohol   Drug use: No   Sexual activity: Yes    Partners: Male    Birth control/protection: Surgical    Comment: Tubal ligation  Other Topics Concern   Not on file  Social History Narrative   Not on file   Social Determinants of Health   Financial Resource Strain: Low Risk  (11/04/2021)   Overall Financial Resource Strain (CARDIA)    Difficulty of Paying Living Expenses: Not hard at all  Food Insecurity: No Food Insecurity (11/04/2021)   Hunger Vital Sign    Worried About Running Out of Food in the Last Year: Never true    Ran Out of Food in the Last Year: Never true  Transportation Needs: No Transportation Needs (11/04/2021)   PRAPARE - Administrator, Civil Service (Medical): No    Lack of Transportation (Non-Medical): No  Physical Activity: Insufficiently Active (11/04/2021)   Exercise Vital Sign    Days of Exercise per Week: 2 days    Minutes of Exercise per Session: 30 min  Stress: Stress Concern Present (11/04/2021)   [employer] of Occupational Health - Occupational Stress Questionnaire    Feeling of Stress : To some extent  Social Connections: Socially Integrated (11/04/2021)   Social Connection and Isolation Panel [NHANES]    Frequency of Communication with Friends and Family: More than three times a week    Frequency of Social Gatherings with Friends and Family: More than three times a week    Attends Religious Services: More than 4 times per year    Active Member of [employer] or Organizations: Yes    Attends Banker Meetings: More than 4 times per year    Marital Status: Married   Vitals:   05/31/22 1550  BP: 110/70  Pulse: 80  Resp: 16  Temp: 98.5 F (36.9 C)  SpO2: 98%   Body mass index is 24.53 kg/m.  Physical Exam Vitals and nursing note reviewed.  Constitutional:      General: She is not in  acute distress.    Appearance: She is well-developed.  HENT:     Head: Normocephalic and atraumatic.     Mouth/Throat:     Mouth: Mucous membranes are dry.     Pharynx: Oropharynx is clear.  Eyes:     Conjunctiva/sclera: Conjunctivae normal.  Cardiovascular:     Rate and Rhythm: Normal rate and regular rhythm.     Heart sounds: No murmur heard. Pulmonary:     Effort: Pulmonary effort is normal. No respiratory distress.     Breath sounds: Normal breath sounds.  Abdominal:     Palpations: Abdomen is soft. There is no hepatomegaly or mass.     Tenderness:  There is no abdominal tenderness.  Musculoskeletal:     Right lower leg: No edema.     Left lower leg: No edema.     Comments: No significant limitations of ROM, no signs of synovitis.  Lymphadenopathy:     Cervical: No cervical adenopathy.  Skin:    General: Skin is warm.     Findings: No erythema or rash.  Neurological:     General: No focal deficit present.     Mental Status: She is alert and oriented to person, place, and time.     Cranial Nerves: No cranial nerve deficit.     Gait: Gait normal.  Psychiatric:        Mood and Affect: Mood and affect normal.   ASSESSMENT AND PLAN:  [name] was seen today for follow-up.  Diagnoses and all orders for this visit:  Myalgia Improving. Tylenol 500 mg 3-4 times if needed for pain. Continue monitoring for new symptoms, including changes in urine output. Instructed about warning signs.  Localized osteoporosis without current pathological fracture We have discussed Prolia as an option. She would like to hold on further treatments for now.at least until all symptoms from Actonel resolved. Fall precautions discussed, continue adequate calcium and vit D intake,and recommend weightbearing exercises.  Abdominal pain, unspecified abdominal location With associated diarrhea and attributed to Actonel. Reporting improvement, diarrhea has resolved.  I do not think further work up  is needed at this time. Continue adequate hydration. Instructed about warning signs.  Return if symptoms worsen or fail to improve, for keep next appointment.  Oris Calmes G. [place], MD  [facility]. [place] office.

## 2022-05-31 ENCOUNTER — Ambulatory Visit (INDEPENDENT_AMBULATORY_CARE_PROVIDER_SITE_OTHER): Payer: Medicare Other | Admitting: Family Medicine

## 2022-05-31 ENCOUNTER — Encounter: Payer: Self-pay | Admitting: Family Medicine

## 2022-05-31 VITALS — BP 110/70 | HR 80 | Temp 98.5°F | Resp 16 | Ht 63.0 in | Wt 138.5 lb

## 2022-05-31 DIAGNOSIS — M816 Localized osteoporosis [Lequesne]: Secondary | ICD-10-CM

## 2022-05-31 DIAGNOSIS — R109 Unspecified abdominal pain: Secondary | ICD-10-CM | POA: Diagnosis not present

## 2022-05-31 DIAGNOSIS — M791 Myalgia, unspecified site: Secondary | ICD-10-CM | POA: Diagnosis not present

## 2022-05-31 NOTE — Patient Instructions (Signed)
A few things to remember from today's visit:  Localized osteoporosis without current pathological fracture  Abdominal pain, unspecified abdominal location  Myalgia  As far as symptoms are improving, we do not need labs. We can re-evaluate osteoporosis treatment after all symptoms resolved.  If you need refills for medications you take chronically, please call your pharmacy. Do not use My Chart to request refills or for acute issues that need immediate attention. If you send a my chart message, it may take a few days to be addressed, specially if I am not in the office.  Please be sure medication list is accurate. If a new problem present, please set up appointment sooner than planned today.

## 2022-06-26 ENCOUNTER — Encounter: Payer: Self-pay | Admitting: Family Medicine

## 2022-06-26 DIAGNOSIS — K219 Gastro-esophageal reflux disease without esophagitis: Secondary | ICD-10-CM

## 2022-06-26 MED ORDER — ESOMEPRAZOLE MAGNESIUM 40 MG PO CPDR: 40.0000 mg | DELAYED_RELEASE_CAPSULE | Freq: Every day | ORAL | 3 refills | Status: DC

## 2022-07-06 ENCOUNTER — Other Ambulatory Visit (HOSPITAL_COMMUNITY)
Admission: RE | Admit: 2022-07-06 | Discharge: 2022-07-06 | Disposition: A | Discharge status: Home / Self Care | Payer: Medicare Other | Source: Ambulatory Visit | Attending: Obstetrics & Gynecology | Admitting: Obstetrics & Gynecology

## 2022-07-06 ENCOUNTER — Encounter (HOSPITAL_BASED_OUTPATIENT_CLINIC_OR_DEPARTMENT_OTHER): Payer: Self-pay | Admitting: Obstetrics & Gynecology

## 2022-07-06 ENCOUNTER — Ambulatory Visit (INDEPENDENT_AMBULATORY_CARE_PROVIDER_SITE_OTHER): Payer: Medicare Other | Admitting: Obstetrics & Gynecology

## 2022-07-06 VITALS — BP 148/72 | HR 70 | Ht 63.0 in | Wt 140.2 lb

## 2022-07-06 DIAGNOSIS — N904 Leukoplakia of vulva: Secondary | ICD-10-CM | POA: Diagnosis not present

## 2022-07-06 DIAGNOSIS — Z9189 Other specified personal risk factors, not elsewhere classified: Secondary | ICD-10-CM

## 2022-07-06 DIAGNOSIS — M816 Localized osteoporosis [Lequesne]: Secondary | ICD-10-CM

## 2022-07-06 DIAGNOSIS — R87612 Low grade squamous intraepithelial lesion on cytologic smear of cervix (LGSIL): Secondary | ICD-10-CM | POA: Diagnosis present

## 2022-07-06 DIAGNOSIS — Z01411 Encounter for gynecological examination (general) (routine) with abnormal findings: Secondary | ICD-10-CM | POA: Diagnosis not present

## 2022-07-06 DIAGNOSIS — Z1151 Encounter for screening for human papillomavirus (HPV): Secondary | ICD-10-CM | POA: Insufficient documentation

## 2022-07-06 MED ORDER — MOMETASONE FUROATE 0.1 % EX OINT: TOPICAL_OINTMENT | Freq: Every day | CUTANEOUS | 2 refills | Status: DC

## 2022-07-06 MED ORDER — MOMETASONE FUROATE 0.1 % EX OINT: TOPICAL_OINTMENT | CUTANEOUS | 2 refills | Status: DC

## 2022-07-06 NOTE — Progress Notes (Signed)
67 y.o. G33P2002 Married [place] American female here for breast and pelvic exam.  I am also following her for history of abnormal pap smears.  Last pap LGSIL.  Denies vaginal bleeding.    Had first biological grandchild in December.  They are in [facility].  She is going there frequently.  Had coronary CT score.  Did have some calcium and is on a statin.  Had mild emphysema changes. She has questions about this.  I do not feel she needs referral at this time.  Never smoked.  Discussed symptoms and when to let me or Dr. Swaziland know about changes.  Would recommend referral then.  BMD this year showed worsening osteoporosis.  She was started on fosamax.  She had significant reaction to this.  She does not want to be on oral medication.  Prolia   Patient's last menstrual period was 02/21/2008.          Sexually active: Yes.    H/O STD:  h/o HPV  Health Maintenance: PCP:  Dr. Swaziland.  She does lab work with her.   Vaccines are up to date:  yes Colonoscopy:  05/10/2021, two adenomatous polyps.  Follow up 7 years. MMG:  02/28/2022 Negative BMD:  03/08/2022 Last pap smear:  06/23/2021 LSIL.     reports that she has never smoked. She has never used smokeless tobacco. She reports that she does not drink alcohol and does not use drugs.  Past Medical History:  Diagnosis Date   Abnormal Pap smear of cervix    neg HPV HR+ 16/18 neg 2017, 2018 LGSIL HPV-, 2019 ASCUS HPV HR+, 08-21-2018 LGSIL HPV HR-   Anxiety    Depression    Diabetes mellitus without complication (HCC)    Dyspareunia    History of colon polyps    Hyperlipidemia    Hypertension    Lichen sclerosus    Thyroid disease     Past Surgical History:  Procedure Laterality Date   COLPOSCOPY  2010, 2018, 2019   History of LSIL, ASCUS + HPV   LYMPH NODE BIOPSY     TUBAL LIGATION  1990    Current Outpatient Medications  Medication Sig Dispense Refill   aspirin 81 MG EC tablet      atorvastatin (LIPITOR) 80 MG tablet Take 1  tablet (80 mg total) by mouth daily. 90 tablet 3   clobetasol ointment (TEMOVATE) 0.05 % Apply bid for itching for up to 5 days.  Call office if itching is not relieved after that time. 30 g 2   EPINEPHrine (EPIPEN 2-PAK) 0.3 mg/0.3 mL IJ SOAJ injection Inject 0.3 mg into the muscle as needed for anaphylaxis. 1 each 1   esomeprazole (NEXIUM) 40 MG capsule Take 1 capsule (40 mg total) by mouth daily. 90 capsule 3   fluticasone (FLONASE) 50 MCG/ACT nasal spray Place into the nose as needed.     hydrochlorothiazide (MICROZIDE) 12.5 MG capsule TAKE 1 CAPSULE BY MOUTH EVERY DAY 90 capsule 3   ibuprofen (ADVIL) 600 MG tablet Take 1 tablet (600 mg total) by mouth every 6 (six) hours as needed. 30 tablet 0   loperamide (IMODIUM) 2 MG capsule Take 1 capsule (2 mg total) by mouth 2 (two) times daily as needed for diarrhea or loose stools. 14 capsule 0   Multiple Vitamin (MULTIVITAMIN) tablet Take 1 tablet by mouth daily.     No current facility-administered medications for this visit.    Family History  Problem Relation Age of  Onset   Hypertension Brother    Hearing loss Brother    Hyperlipidemia Brother    Cancer Mother        colon   Hypertension Father    Alcohol abuse Father    Diabetes Father    Heart disease Maternal Grandmother        CHF   Arthritis Maternal Grandmother    Cancer Maternal Grandfather        kidney   Heart disease Paternal Grandmother        CHF   Birth defects Daughter    Breast cancer Paternal Aunt    Heart disease Paternal Aunt        open heart surgery    Review of Systems  Constitutional: Negative.   Genitourinary: Negative.     Exam:   BP (!) 148/72   Pulse 70   Ht 5\' 3"  (1.6 m) Comment: Reported  Wt 140 lb 3.2 oz (63.6 kg)   LMP 02/21/2008   BMI 24.84 kg/m   Height: 5\' 3"  (160 cm) (Reported)  General appearance: alert, cooperative and appears stated age Breasts: normal appearance, no masses or tenderness Abdomen: soft, non-tender; bowel sounds  normal; no masses,  no organomegaly Lymph nodes: Cervical, supraclavicular, and axillary nodes normal.  No abnormal inguinal nodes palpated Neurologic: Grossly normal  Pelvic: External genitalia:  hypopigmentation of inner labia majora from clitoris down to perineal body, no ulcerations              Urethra:  normal appearing urethra with no masses, tenderness or lesions              Bartholins and Skenes: normal                 Vagina: normal appearing vagina with atrophic changes and no discharge, no lesions              Cervix: no lesions              Pap taken: Yes.   Bimanual Exam:  Uterus:  normal size, contour, position, consistency, mobility, non-tender              Adnexa: normal adnexa and no mass, fullness, tenderness               Rectovaginal: Confirms               Anus:  normal sphincter tone, no lesions  Chaperone, Ina Homes, CMA, was present for exam.  Assessment/Plan: 1. GYN exam for high-risk Medicare patient - Pap smear obtained today with HR HPV - Mammogram up to date - Colonoscopy 2023 - Bone mineral density reviewed with pt today.   - lab work done with PCP, Dr. Swaziland - vaccines reviewed/updated  2. LGSIL on Pap smear of cervix - Cytology - PAP( [place])  3. Lichen sclerosus et atrophicus of the vulva - discussed changing to mometasone for maintenance.   - mometasone (ELOCON) 0.1 % ointment; Apply topically one to two times weekly.  Dispense: 45 g; Refill: 2  4. Localized osteoporosis without current pathological fracture - Ambulatory referral to Endocrinology

## 2022-07-12 LAB — CYTOLOGY - PAP
Comment: NEGATIVE
High risk HPV: NEGATIVE

## 2022-08-08 DIAGNOSIS — E785 Hyperlipidemia, unspecified: Secondary | ICD-10-CM | POA: Diagnosis not present

## 2022-08-08 DIAGNOSIS — I10 Essential (primary) hypertension: Secondary | ICD-10-CM | POA: Diagnosis not present

## 2022-08-08 DIAGNOSIS — K219 Gastro-esophageal reflux disease without esophagitis: Secondary | ICD-10-CM | POA: Diagnosis not present

## 2022-08-08 DIAGNOSIS — Z0000 Encounter for general adult medical examination without abnormal findings: Secondary | ICD-10-CM | POA: Diagnosis not present

## 2022-08-08 DIAGNOSIS — E559 Vitamin D deficiency, unspecified: Secondary | ICD-10-CM | POA: Diagnosis not present

## 2022-08-08 DIAGNOSIS — Z79899 Other long term (current) drug therapy: Secondary | ICD-10-CM | POA: Diagnosis not present

## 2022-08-11 ENCOUNTER — Encounter: Payer: Self-pay | Admitting: Family Medicine

## 2022-08-11 ENCOUNTER — Ambulatory Visit (INDEPENDENT_AMBULATORY_CARE_PROVIDER_SITE_OTHER): Payer: Medicare Other | Admitting: Family Medicine

## 2022-08-11 VITALS — BP 112/68 | HR 64 | Temp 98.7°F | Wt 143.2 lb

## 2022-08-11 DIAGNOSIS — J302 Other seasonal allergic rhinitis: Secondary | ICD-10-CM

## 2022-08-11 DIAGNOSIS — H6992 Unspecified Eustachian tube disorder, left ear: Secondary | ICD-10-CM

## 2022-08-11 NOTE — Progress Notes (Signed)
   Established Patient Office Visit   Subjective  Patient ID: Nancy Chandler, female    DOB: Jul 18, 1955  Age: 67 y.o. MRN: [id number]  Chief Complaint  Patient presents with   Facial Pain    Sinus pressure on left side of face, nose and under neck, felt it a little on yesterday but more on yesterday. Has only taken extra strength tylenol, as she was not sure what all she could take.     Patient is a 67 year old female followed by Dr. Swaziland and seen for acute concern.  Patient endorses left-sided facial pressure, ear pressure and neck pressure x 2-3 days.  Felt like back teeth were hurting.  Patient denies headache, fever, rhinorrhea, nasal congestion, cough, sore throat, nausea, vomiting.  Patient tried Tylenol for symptoms.  Was unsure if she could take anything else with her current medications.  In the past pt use Nasonex for seasonal allergies.  Also notes nasal drainage at night which started prior to current symptoms.      ROS Negative unless stated above    Objective:     BP 112/68 (BP Location: Right Arm, Patient Position: Sitting, Cuff Size: Normal)   Pulse 64   Temp 98.7 F (37.1 C) (Oral)   Wt 143 lb 3.2 oz (65 kg)   LMP 02/21/2008   SpO2 97%   BMI 25.37 kg/m    Physical Exam Constitutional:      General: She is not in acute distress.    Appearance: Normal appearance.  HENT:     Head: Normocephalic and atraumatic.     Nose: Nose normal.     Right Sinus: No maxillary sinus tenderness or frontal sinus tenderness.     Left Sinus: No maxillary sinus tenderness or frontal sinus tenderness.     Mouth/Throat:     Mouth: Mucous membranes are moist.     Comments: Mallampati 3.  Clear postnasal drainage.  No erythema. Cardiovascular:     Rate and Rhythm: Normal rate and regular rhythm.     Heart sounds: Normal heart sounds. No murmur heard.    No gallop.  Pulmonary:     Effort: Pulmonary effort is normal. No respiratory distress.     Breath sounds: Normal  breath sounds. No wheezing, rhonchi or rales.  Skin:    General: Skin is warm and dry.  Neurological:     Mental Status: She is alert and oriented to person, place, and time.    No results found for any visits on 08/11/22.    Assessment & Plan:  Seasonal allergies  Dysfunction of left eustachian tube   Discussed starting OTC antihistamines nasal spray for current symptoms.  Steam from shower can also be helpful or p.o. antihistamine.  Okay to use Tylenol or other OTC cough/cold medications if needed.  No current indication for antibiotic.  Continue to monitor.  Return if symptoms worsen or fail to improve.   Deeann Saint, MD

## 2022-09-06 ENCOUNTER — Ambulatory Visit: Payer: Medicare Other

## 2022-09-06 VITALS — Ht 63.0 in | Wt 141.0 lb

## 2022-09-06 DIAGNOSIS — Z0000 Encounter for general adult medical examination without abnormal findings: Secondary | ICD-10-CM | POA: Diagnosis not present

## 2022-09-06 NOTE — Progress Notes (Signed)
Subjective:   Nancy Chandler is a 67 y.o. female who presents for Medicare Annual (Subsequent) preventive examination.  Visit Complete: Virtual  I connected with  Breeana Sawtelle on 09/06/22 by a audio enabled telemedicine application and verified that I am speaking with the correct person using two identifiers.  Patient Location: Home  Provider Location: Home Office  I discussed the limitations of evaluation and management by telemedicine. The patient expressed understanding and agreed to proceed.  Patient Medicare AWV questionnaire was completed by the patient on 09/03/22; I have confirmed that all information answered by patient is correct and no changes since this date.  Review of Systems     Cardiac Risk Factors include: advanced age (>48men, >24 women);diabetes mellitus;hypertension     Objective:    Today's Vitals   09/06/22 1540  Weight: 141 lb (64 kg)  Height: 5\' 3"  (1.6 m)   Body mass index is 24.98 kg/m.     09/06/2022    3:48 PM 09/01/2021   11:07 AM 08/30/2020    8:35 AM 03/16/2016    2:45 PM 02/08/2016    4:40 PM 11/02/2015    4:43 PM  Advanced Directives  Does Patient Have a Medical Advance Directive? No No Yes No Yes Yes  Type of Surveyor, minerals;Living will  Healthcare Power of Attorney   Does patient want to make changes to medical advance directive?      Yes - information given  Copy of Healthcare Power of Attorney in Chart?   No - copy requested  No - copy requested No - copy requested  Would patient like information on creating a medical advance directive? No - Patient declined No - Patient declined  Yes (Inpatient - patient requests chaplain consult to create a medical advance directive)      Current Medications (verified) Outpatient Encounter Medications as of 09/06/2022  Medication Sig   aspirin 81 MG EC tablet    atorvastatin (LIPITOR) 80 MG tablet Take 1 tablet (80 mg total) by mouth daily.    clobetasol ointment (TEMOVATE) 0.05 % Apply bid for itching for up to 5 days.  Call office if itching is not relieved after that time.   EPINEPHrine (EPIPEN 2-PAK) 0.3 mg/0.3 mL IJ SOAJ injection Inject 0.3 mg into the muscle as needed for anaphylaxis.   esomeprazole (NEXIUM) 40 MG capsule Take 1 capsule (40 mg total) by mouth daily.   fluticasone (FLONASE) 50 MCG/ACT nasal spray Place into the nose as needed.   hydrochlorothiazide (MICROZIDE) 12.5 MG capsule TAKE 1 CAPSULE BY MOUTH EVERY DAY   ibuprofen (ADVIL) 600 MG tablet Take 1 tablet (600 mg total) by mouth every 6 (six) hours as needed.   mometasone (ELOCON) 0.1 % ointment Apply to affected skin one to two times weekly   Multiple Vitamin (MULTIVITAMIN) tablet Take 1 tablet by mouth daily.   No facility-administered encounter medications on file as of 09/06/2022.    Allergies (verified) Bee venom, Actonel [risedronate], and Hydrocodone   History: Past Medical History:  Diagnosis Date   Abnormal Pap smear of cervix    neg HPV HR+ 16/18 neg 2017, 2018 LGSIL HPV-, 2019 ASCUS HPV HR+, 08-21-2018 LGSIL HPV HR-   Anxiety    Depression    Diabetes mellitus without complication (HCC)    Dyspareunia    History of colon polyps    Hyperlipidemia    Hypertension    Lichen sclerosus    Thyroid disease  Past Surgical History:  Procedure Laterality Date   COLPOSCOPY  2010, 2018, 2019   History of LSIL, ASCUS + HPV   LYMPH NODE BIOPSY     TUBAL LIGATION  1990   Family History  Problem Relation Age of Onset   Hypertension Brother    Hearing loss Brother    Hyperlipidemia Brother    Cancer Mother        colon   Hypertension Father    Alcohol abuse Father    Diabetes Father    Heart disease Maternal Grandmother        CHF   Arthritis Maternal Grandmother    Cancer Maternal Grandfather        kidney   Heart disease Paternal Grandmother        CHF   Birth defects Daughter    Breast cancer Paternal Aunt    Heart disease  Paternal Aunt        open heart surgery   Social History   Socioeconomic History   Marital status: Married    Spouse name: Not on file   Number of children: 2   Years of education: Not on file   Highest education level: Some college, no degree  Occupational History   Not on file  Tobacco Use   Smoking status: Never   Smokeless tobacco: Never  Vaping Use   Vaping status: Never Used  Substance and Sexual Activity   Alcohol use: No    Alcohol/week: 0.0 standard drinks of alcohol   Drug use: No   Sexual activity: Yes    Partners: Male    Birth control/protection: Surgical    Comment: Tubal ligation  Other Topics Concern   Not on file  Social History Narrative   Not on file   Social Determinants of Health   Financial Resource Strain: Low Risk  (09/06/2022)   Overall Financial Resource Strain (CARDIA)    Difficulty of Paying Living Expenses: Not hard at all  Food Insecurity: No Food Insecurity (09/06/2022)   Hunger Vital Sign    Worried About Running Out of Food in the Last Year: Never true    Ran Out of Food in the Last Year: Never true  Transportation Needs: No Transportation Needs (09/06/2022)   PRAPARE - Administrator, Civil Service (Medical): No    Lack of Transportation (Non-Medical): No  Physical Activity: Insufficiently Active (09/06/2022)   Exercise Vital Sign    Days of Exercise per Week: 2 days    Minutes of Exercise per Session: 30 min  Stress: No Stress Concern Present (09/06/2022)   [employer] of Occupational Health - Occupational Stress Questionnaire    Feeling of Stress : Not at all  Social Connections: Socially Integrated (09/06/2022)   Social Connection and Isolation Panel [NHANES]    Frequency of Communication with Friends and Family: More than three times a week    Frequency of Social Gatherings with Friends and Family: More than three times a week    Attends Religious Services: More than 4 times per year    Active Member of [employer] or  Organizations: Yes    Attends Engineer, structural: More than 4 times per year    Marital Status: Married    Tobacco Counseling Counseling given: Not Answered   Clinical Intake:  Pre-visit preparation completed: Yes  Pain : No/denies pain     BMI - recorded: 24.98 Nutritional Status: BMI of 19-24  Normal Nutritional Risks: None Diabetes: Yes CBG done?:  No Did pt. bring in CBG monitor from home?: No  How often do you need to have someone help you when you read instructions, pamphlets, or other written materials from your doctor or pharmacy?: 1 - Never  Interpreter Needed?: No  Information entered by :: Theresa Mulligan LPN   Activities of Daily Living    09/06/2022    3:47 PM 09/03/2022    6:41 PM  In your present state of health, do you have any difficulty performing the following activities:  Hearing? 0 0  Vision? 0 0  Difficulty concentrating or making decisions? 0 0  Walking or climbing stairs? 0 0  Dressing or bathing? 0 0  Doing errands, shopping? 0 0  Preparing Food and eating ? N N  Using the Toilet? N N  In the past six months, have you accidently leaked urine? N N  Do you have problems with loss of bowel control? N N  Managing your Medications? N N  Managing your Finances? N N  Housekeeping or managing your Housekeeping? N N    Patient Care Team: [place], Betty G, MD as PCP - General (Family Medicine) Jodelle Red, MD as PCP - Cardiology (Cardiology)  Indicate any recent Medical Services you may have received from other than [facility] providers in the past year (date may be approximate).     Assessment:   This is a routine wellness examination for Shayonna.  Hearing/Vision screen Hearing Screening - Comments:: Denies hearing difficulties   Vision Screening - Comments:: Wears rx glasses - up to date with routine eye exams with  [facility]  Dietary issues and exercise activities discussed:     Goals Addressed                This Visit's Progress     Stay healthy (pt-stated)        Continue working.       Depression Screen    09/06/2022    3:46 PM 07/06/2022    8:12 AM 05/16/2022    7:08 AM 03/17/2022    8:04 AM 01/24/2022    7:28 AM 11/08/2021    7:26 AM 09/06/2021    7:08 AM  PHQ 2/9 Scores  PHQ - 2 Score 0 0 0 0 0 0 0  PHQ- 9 Score   1 1       Fall Risk    09/06/2022    3:47 PM 09/03/2022    6:41 PM 03/17/2022    8:04 AM 01/24/2022    7:28 AM 11/08/2021    7:26 AM  Fall Risk   Falls in the past year? 0 0 0 0 0  Number falls in past yr: 0  0 0 0  Injury with Fall? 0 0 0 0 0  Risk for fall due to : No Fall Risks  No Fall Risks Other (Comment) No Fall Risks  Follow up Falls prevention discussed  Falls evaluation completed Falls evaluation completed Falls evaluation completed    MEDICARE RISK AT HOME:  Medicare Risk at Home - 09/06/22 1552     Any stairs in or around the home? Yes    If so, are there any without handrails? No    Home free of loose throw rugs in walkways, pet beds, electrical cords, etc? Yes    Adequate lighting in your home to reduce risk of falls? Yes    Life alert? No    Use of a cane, walker or w/c? No  Grab bars in the bathroom? No    Shower chair or bench in shower? No    Elevated toilet seat or a handicapped toilet? No             TIMED UP AND GO:  Was the test performed?  No    Cognitive Function:        09/06/2022    3:49 PM 09/01/2021   11:07 AM  6CIT Screen  What Year? 0 points 0 points  What month? 0 points 0 points  What time? 0 points 0 points  Count back from 20 0 points 0 points  Months in reverse 0 points 0 points  Repeat phrase 0 points 0 points  Total Score 0 points 0 points    Immunizations Immunization History  Administered Date(s) Administered   Fluad Quad(high Dose 65+) 10/27/2020, 11/08/2021   Influenza,inj,Quad PF,6+ Mos 03/14/2017, 02/25/2018, 12/05/2018   Influenza-Unspecified 12/04/2013, 11/21/2015   Moderna Sars-Covid-2  Vaccination 04/12/2019, 05/13/2019, 12/30/2019, 07/27/2020   PFIZER Comirnaty(Gray Top)Covid-19 Tri-Sucrose Vaccine 11/29/2021   PNEUMOCOCCAL CONJUGATE-20 03/17/2022   Pneumococcal Polysaccharide-23 03/16/2016   Respiratory Syncytial Virus Vaccine,Recomb Aduvanted(Arexvy) 12/15/2021   Tdap 12/08/2013   Zoster Recombinant(Shingrix) 10/20/2019, 07/27/2020    TDAP status: Up to date  Flu Vaccine status: Up to date  Pneumococcal vaccine status: Up to date  Covid-19 vaccine status: Completed vaccines  Qualifies for Shingles Vaccine? Yes   Zostavax completed Yes   Shingrix Completed?: Yes  Screening Tests Health Maintenance  Topic Date Due   COVID-19 Vaccine (6 - 2023-24 season) 01/24/2022   HEMOGLOBIN A1C  09/15/2022   INFLUENZA VACCINE  09/21/2022   Diabetic kidney evaluation - Urine ACR  11/09/2022   FOOT EXAM  11/09/2022   OPHTHALMOLOGY EXAM  12/02/2022   MAMMOGRAM  03/01/2023   Diabetic kidney evaluation - eGFR measurement  04/18/2023   Medicare Annual Wellness (AWV)  09/06/2023   DTaP/Tdap/Td (2 - Td or Tdap) 12/09/2023   Colonoscopy  05/10/2028   Pneumonia Vaccine 70+ Years old  Completed   DEXA SCAN  Completed   Hepatitis C Screening  Completed   Zoster Vaccines- Shingrix  Completed   HPV VACCINES  Aged Out    Health Maintenance  Health Maintenance Due  Topic Date Due   COVID-19 Vaccine (6 - 2023-24 season) 01/24/2022    Colorectal cancer screening: Type of screening: Colonoscopy. Completed 05/10/21. Repeat every 7 years  Mammogram status: Completed 02/28/22. Repeat every year  Bone Density status: Completed 03/08/22. Results reflect: Bone density results: OSTEOPOROSIS. Repeat every   years.  Lung Cancer Screening: (Low Dose CT Chest recommended if Age 4-80 years, 20 pack-year currently smoking OR have quit w/in 15years.) does not qualify.     Additional Screening:  Hepatitis C Screening: does qualify; Completed 09/28/14  Vision Screening: Recommended  annual ophthalmology exams for early detection of glaucoma and other disorders of the eye. Is the patient up to date with their annual eye exam?  Yes  Who is the provider or what is the name of the office in which the patient attends annual eye exams? [facility] If pt is not established with a provider, would they like to be referred to a provider to establish care? No .   Dental Screening: Recommended annual dental exams for proper oral hygiene  Diabetic Foot Exam: Diabetic Foot Exam: Overdue, Pt has been advised about the importance in completing this exam. Pt is scheduled for diabetic foot exam on Followed by PCP.  [facility]  Referral / Chronic Care Management:  CRR required this visit?  No   CCM required this visit?  No     Plan:     I have personally reviewed and noted the following in the patient's chart:   Medical and social history Use of alcohol, tobacco or illicit drugs  Current medications and supplements including opioid prescriptions. Patient is not currently taking opioid prescriptions. Functional ability and status Nutritional status Physical activity Advanced directives List of other physicians Hospitalizations, surgeries, and ER visits in previous 12 months Vitals Screenings to include cognitive, depression, and falls Referrals and appointments  In addition, I have reviewed and discussed with patient certain preventive protocols, quality metrics, and best practice recommendations. A written personalized care plan for preventive services as well as general preventive health recommendations were provided to patient.     Tillie Rung, LPN   [id number]   After Visit Summary: (Mail) Due to this being a telephonic visit, the after visit summary with patients personalized plan was offered to patient via mail   Nurse Notes: Patient request f/u to renew Handicap Parking

## 2022-09-06 NOTE — Patient Instructions (Addendum)
Nancy Chandler , Thank you for taking time to come for your Medicare Wellness Visit. I appreciate your ongoing commitment to your health goals. Please review the following plan we discussed and let me know if I can assist you in the future.   These are the goals we discussed:  Goals       Cut out extra servings      Stay healthy (pt-stated)      Continue working.        This is a list of the screening recommended for you and due dates:  Health Maintenance  Topic Date Due   COVID-19 Vaccine (6 - 2023-24 season) 01/24/2022   Hemoglobin A1C  09/15/2022   Flu Shot  09/21/2022   Yearly kidney health urinalysis for diabetes  11/09/2022   Complete foot exam   11/09/2022   Eye exam for diabetics  12/02/2022   Mammogram  03/01/2023   Yearly kidney function blood test for diabetes  04/18/2023   Medicare Annual Wellness Visit  09/06/2023   DTaP/Tdap/Td vaccine (2 - Td or Tdap) 12/09/2023   Colon Cancer Screening  05/10/2028   Pneumonia Vaccine  Completed   DEXA scan (bone density measurement)  Completed   Hepatitis C Screening  Completed   Zoster (Shingles) Vaccine  Completed   HPV Vaccine  Aged Out    Advanced directives: Advance directive discussed with you today. Even though you declined this today, please call our office should you change your mind, and we can give you the proper paperwork for you to fill out.   Conditions/risks identified: None  Next appointment: Follow up in one year for your annual wellness visit    Preventive Care 65 Years and Older, Female Preventive care refers to lifestyle choices and visits with your health care provider that can promote health and wellness. What does preventive care include? A yearly physical exam. This is also called an annual well check. Dental exams once or twice a year. Routine eye exams. Ask your health care provider how often you should have your eyes checked. Personal lifestyle choices, including: Daily care of your teeth and  gums. Regular physical activity. Eating a healthy diet. Avoiding tobacco and drug use. Limiting alcohol use. Practicing safe sex. Taking low-dose aspirin every day. Taking vitamin and mineral supplements as recommended by your health care provider. What happens during an annual well check? The services and screenings done by your health care provider during your annual well check will depend on your age, overall health, lifestyle risk factors, and family history of disease. Counseling  Your health care provider may ask you questions about your: Alcohol use. Tobacco use. Drug use. Emotional well-being. Home and relationship well-being. Sexual activity. Eating habits. History of falls. Memory and ability to understand (cognition). Work and work Astronomer. Reproductive health. Screening  You may have the following tests or measurements: Height, weight, and BMI. Blood pressure. Lipid and cholesterol levels. These may be checked every 5 years, or more frequently if you are over 73 years old. Skin check. Lung cancer screening. You may have this screening every year starting at age 70 if you have a 30-pack-year history of smoking and currently smoke or have quit within the past 15 years. Fecal occult blood test (FOBT) of the stool. You may have this test every year starting at age 52. Flexible sigmoidoscopy or colonoscopy. You may have a sigmoidoscopy every 5 years or a colonoscopy every 10 years starting at age 45. Hepatitis C blood test. Hepatitis  B blood test. Sexually transmitted disease (STD) testing. Diabetes screening. This is done by checking your blood sugar (glucose) after you have not eaten for a while (fasting). You may have this done every 1-3 years. Bone density scan. This is done to screen for osteoporosis. You may have this done starting at age 61. Mammogram. This may be done every 1-2 years. Talk to your health care provider about how often you should have regular  mammograms. Talk with your health care provider about your test results, treatment options, and if necessary, the need for more tests. Vaccines  Your health care provider may recommend certain vaccines, such as: Influenza vaccine. This is recommended every year. Tetanus, diphtheria, and acellular pertussis (Tdap, Td) vaccine. You may need a Td booster every 10 years. Zoster vaccine. You may need this after age 89. Pneumococcal 13-valent conjugate (PCV13) vaccine. One dose is recommended after age 65. Pneumococcal polysaccharide (PPSV23) vaccine. One dose is recommended after age 56. Talk to your health care provider about which screenings and vaccines you need and how often you need them. This information is not intended to replace advice given to you by your health care provider. Make sure you discuss any questions you have with your health care provider. Document Released: 03/05/2015 Document Revised: 10/27/2015 Document Reviewed: 12/08/2014 Elsevier Interactive Patient Education  2017 [employer].  Fall Prevention in the Home Falls can cause injuries. They can happen to people of all ages. There are many things you can do to make your home safe and to help prevent falls. What can I do on the outside of my home? Regularly fix the edges of walkways and driveways and fix any cracks. Remove anything that might make you trip as you walk through a door, such as a raised step or threshold. Trim any bushes or trees on the path to your home. Use bright outdoor lighting. Clear any walking paths of anything that might make someone trip, such as rocks or tools. Regularly check to see if handrails are loose or broken. Make sure that both sides of any steps have handrails. Any raised decks and porches should have guardrails on the edges. Have any leaves, snow, or ice cleared regularly. Use sand or salt on walking paths during winter. Clean up any spills in your garage right away. This includes oil  or grease spills. What can I do in the bathroom? Use night lights. Install grab bars by the toilet and in the tub and shower. Do not use towel bars as grab bars. Use non-skid mats or decals in the tub or shower. If you need to sit down in the shower, use a plastic, non-slip stool. Keep the floor dry. Clean up any water that spills on the floor as soon as it happens. Remove soap buildup in the tub or shower regularly. Attach bath mats securely with double-sided non-slip rug tape. Do not have throw rugs and other things on the floor that can make you trip. What can I do in the bedroom? Use night lights. Make sure that you have a light by your bed that is easy to reach. Do not use any sheets or blankets that are too big for your bed. They should not hang down onto the floor. Have a firm chair that has side arms. You can use this for support while you get dressed. Do not have throw rugs and other things on the floor that can make you trip. What can I do in the kitchen? Clean up any  spills right away. Avoid walking on wet floors. Keep items that you use a lot in easy-to-reach places. If you need to reach something above you, use a strong step stool that has a grab bar. Keep electrical cords out of the way. Do not use floor polish or wax that makes floors slippery. If you must use wax, use non-skid floor wax. Do not have throw rugs and other things on the floor that can make you trip. What can I do with my stairs? Do not leave any items on the stairs. Make sure that there are handrails on both sides of the stairs and use them. Fix handrails that are broken or loose. Make sure that handrails are as long as the stairways. Check any carpeting to make sure that it is firmly attached to the stairs. Fix any carpet that is loose or worn. Avoid having throw rugs at the top or bottom of the stairs. If you do have throw rugs, attach them to the floor with carpet tape. Make sure that you have a light  switch at the top of the stairs and the bottom of the stairs. If you do not have them, ask someone to add them for you. What else can I do to help prevent falls? Wear shoes that: Do not have high heels. Have rubber bottoms. Are comfortable and fit you well. Are closed at the toe. Do not wear sandals. If you use a stepladder: Make sure that it is fully opened. Do not climb a closed stepladder. Make sure that both sides of the stepladder are locked into place. Ask someone to hold it for you, if possible. Clearly mark and make sure that you can see: Any grab bars or handrails. First and last steps. Where the edge of each step is. Use tools that help you move around (mobility aids) if they are needed. These include: Canes. Walkers. Scooters. Crutches. Turn on the lights when you go into a dark area. Replace any light bulbs as soon as they burn out. Set up your furniture so you have a clear path. Avoid moving your furniture around. If any of your floors are uneven, fix them. If there are any pets around you, be aware of where they are. Review your medicines with your doctor. Some medicines can make you feel dizzy. This can increase your chance of falling. Ask your doctor what other things that you can do to help prevent falls. This information is not intended to replace advice given to you by your health care provider. Make sure you discuss any questions you have with your health care provider. Document Released: 12/03/2008 Document Revised: 07/15/2015 Document Reviewed: 03/13/2014 Elsevier Interactive Patient Education  2017 [employer].

## 2023-01-23 ENCOUNTER — Encounter: Payer: Self-pay | Admitting: Internal Medicine

## 2023-01-23 ENCOUNTER — Ambulatory Visit: Payer: Medicare Other | Admitting: Internal Medicine

## 2023-01-23 ENCOUNTER — Telehealth: Payer: Self-pay | Admitting: Pharmacy Technician

## 2023-01-23 VITALS — BP 150/74 | HR 76 | Ht 63.0 in | Wt 147.0 lb

## 2023-01-23 DIAGNOSIS — M810 Age-related osteoporosis without current pathological fracture: Secondary | ICD-10-CM | POA: Diagnosis not present

## 2023-01-23 NOTE — Progress Notes (Unsigned)
Name: Nancy Chandler  MRN/ DOB: [id number], Apr 01, 1955    Age/ Sex: 67 y.o., female    PCP: [place], Betty G, MD   Reason for Endocrinology Evaluation: Osteoporosis      Date of Initial Endocrinology Evaluation: 01/23/2023     HPI: Ms. Nancy Chandler is a 67 y.o. female with a past medical history of HTN, GERD, PAD. The patient presented for initial endocrinology clinic visit on 01/23/2023 for consultative assistance with her Osteoporosis .   Pt was diagnosed with osteoporosis: in 2012 with a T score of 2.5 at the femoral neck  Menarche at age : 64 Menopausal at age : 56 Fracture Hx: no Hx of HRT: no FH of osteoporosis or hip fracture: Maternal grandmother  Prior Hx of anti-resorptive therapy : Actonel-diarrhea/aching . Fosamax- stomach upset.  Her PCP attempted to prescribe Prolia but the co-pay was $315 which would cause hardship on the patient.  She has GERD  She has alternating constipation with diarrhea  No falls Has occasional hand pain, at times hips and knees She walks several times a work     She is not on calcium  She is on Vitamin D3 2000 international unit daily  She is on MVI   HISTORY:  Past Medical History:  Past Medical History:  Diagnosis Date   Abnormal Pap smear of cervix    neg HPV HR+ 16/18 neg 2017, 2018 LGSIL HPV-, 2019 ASCUS HPV HR+, 08-21-2018 LGSIL HPV HR-   Anxiety    Depression    Diabetes mellitus without complication (HCC)    Dyspareunia    History of colon polyps    Hyperlipidemia    Hypertension    Lichen sclerosus    Thyroid disease    Past Surgical History:  Past Surgical History:  Procedure Laterality Date   COLPOSCOPY  2010, 2018, 2019   History of LSIL, ASCUS + HPV   LYMPH NODE BIOPSY     TUBAL LIGATION  1990    Social History:  reports that she has never smoked. She has never used smokeless tobacco. She reports that she does not drink alcohol and does not use drugs. Family History: family history includes  Alcohol abuse in her father; Arthritis in her maternal grandmother; Birth defects in her daughter; Breast cancer in her paternal aunt; Cancer in her maternal grandfather and mother; Diabetes in her father; Hearing loss in her brother; Heart disease in her maternal grandmother, paternal aunt, and paternal grandmother; Hyperlipidemia in her brother; Hypertension in her brother and father.   HOME MEDICATIONS: Allergies as of 01/23/2023       Reactions   Bee Venom Anaphylaxis   Carries Epi pen    Actonel [risedronate] Diarrhea, Other (See Comments)   Hydrocodone Nausea And Vomiting        Medication List        Accurate as of January 23, 2023  1:43 PM. If you have any questions, ask your nurse or doctor.          aspirin EC 81 MG tablet   atorvastatin 80 MG tablet Commonly known as: LIPITOR Take 1 tablet (80 mg total) by mouth daily.   clobetasol ointment 0.05 % Commonly known as: TEMOVATE Apply bid for itching for up to 5 days.  Call office if itching is not relieved after that time.   EPINEPHrine 0.3 mg/0.3 mL Soaj injection Commonly known as: EpiPen 2-Pak Inject 0.3 mg into the muscle as needed for anaphylaxis.   esomeprazole  40 MG capsule Commonly known as: NexIUM Take 1 capsule (40 mg total) by mouth daily.   fluticasone 50 MCG/ACT nasal spray Commonly known as: FLONASE Place into the nose as needed.   hydrochlorothiazide 12.5 MG capsule Commonly known as: MICROZIDE TAKE 1 CAPSULE BY MOUTH EVERY DAY   ibuprofen 600 MG tablet Commonly known as: ADVIL Take 1 tablet (600 mg total) by mouth every 6 (six) hours as needed.   mometasone 0.1 % ointment Commonly known as: ELOCON Apply to affected skin one to two times weekly   multivitamin tablet Take 1 tablet by mouth daily.          REVIEW OF SYSTEMS: A comprehensive ROS was conducted with the patient and is negative except as per HPI    OBJECTIVE:  VS: BP (!) 150/74   Pulse 76   Ht 5\' 3"  (1.6 m)    Wt 147 lb (66.7 kg)   LMP 02/21/2008   SpO2 97%   BMI 26.04 kg/m    Wt Readings from Last 3 Encounters:  01/23/23 147 lb (66.7 kg)  09/06/22 141 lb (64 kg)  08/11/22 143 lb 3.2 oz (65 kg)     EXAM: General: Pt appears well and is in NAD  Neck: General: Supple without adenopathy. Thyroid: Thyroid size normal.  No goiter or nodules appreciated.   Lungs: Clear with good BS bilat   Heart: Auscultation: RRR.  Abdomen: Soft, nontender  Extremities:  BL LE: No pretibial edema   Mental Status: Judgment, insight: Intact Orientation: Oriented to time, place, and person Mood and affect: No depression, anxiety, or agitation     DATA REVIEWED: ***    DXA 03/08/2022    Change 2020  AP Spine -2.0 Down 1.0%  RFN -3.5   RTH -2.1 Down 1.0%  LFN -2.7   LTH -2.1 Down 8%    ASSESSMENT/PLAN/RECOMMENDATIONS:   Osteoporosis :  -Emphasized the importance of optimizing calcium and vitamin D intake -Emphasized the importance of weightbearing exercises -She reports GI intolerance with Actonel and Fosamax -Prolia has been cost prohibitive and I suspect Evenity will be in the same category -She has no history of fractures -I have recommended zoledronic acid at this time, We discussed rare side effect of atypical fractures and osteonecrosis. Pt encouraged to have an updated dental exam, I also explained to the patient and the chance of flulike symptoms with first injection   Medications : Calcium 1200 mg daily Vitamin D 2000 IU daily Zoledronic acid 5 mg IV annually  Follow-up in 6 months  Signed electronically by: Lyndle Herrlich, MD  [facility]  [facility] [address] [place] [place], [place] [address] Phone: [phone] FAX: [phone]   CC: [place], Betty G, MD [address] [place] [address] Phone: [phone] Fax: [phone]   Return to Endocrinology clinic as below: Future Appointments  Date Time Provider  Department Center  07/12/2023  1:15 PM Jerene Bears, MD [facility]  09/10/2023  3:00 PM [facility]-ANNUAL WELLNESS VISIT [facility]

## 2023-01-23 NOTE — Telephone Encounter (Signed)
Auth Submission: NO AUTH NEEDED Site of care: Site of care: [facility] Payer: [employer] Medication & CPT/J Code(s) submitted: Reclast (Zolendronic acid) [id number] Route of submission (phone, fax, portal):  Phone # Fax # Auth type: Buy/Bill PB Units/visits requested: 1 Reference number:  Approval from: 01/23/23 to 03/23/23

## 2023-01-23 NOTE — Patient Instructions (Signed)
Please take calcium 1200 mg a day ( may continue to take multivitamin plus 2 servings of low fat dairy )

## 2023-01-24 LAB — COMPREHENSIVE METABOLIC PANEL
AG Ratio: 1.1 (calc) (ref 1.0–2.5)
ALT: 15 U/L (ref 6–29)
AST: 17 U/L (ref 10–35)
Albumin: 3.9 g/dL (ref 3.6–5.1)
Alkaline phosphatase (APISO): 86 U/L (ref 37–153)
BUN: 16 mg/dL (ref 7–25)
CO2: 33 mmol/L — ABNORMAL HIGH (ref 20–32)
Calcium: 9.7 mg/dL (ref 8.6–10.4)
Chloride: 102 mmol/L (ref 98–110)
Creat: 0.95 mg/dL (ref 0.50–1.05)
Globulin: 3.6 g/dL (ref 1.9–3.7)
Glucose, Bld: 74 mg/dL (ref 65–99)
Potassium: 4 mmol/L (ref 3.5–5.3)
Sodium: 143 mmol/L (ref 135–146)
Total Bilirubin: 0.6 mg/dL (ref 0.2–1.2)
Total Protein: 7.5 g/dL (ref 6.1–8.1)

## 2023-01-24 LAB — VITAMIN D 25 HYDROXY (VIT D DEFICIENCY, FRACTURES): Vit D, 25-Hydroxy: 38 ng/mL (ref 30–100)

## 2023-01-24 LAB — T4, FREE: Free T4: 1.5 ng/dL (ref 0.8–1.8)

## 2023-01-24 LAB — PHOSPHORUS: Phosphorus: 4 mg/dL (ref 2.1–4.3)

## 2023-01-24 LAB — TSH: TSH: 0.49 m[IU]/L (ref 0.40–4.50)

## 2023-01-24 LAB — PARATHYROID HORMONE, INTACT (NO CA): PTH: 59 pg/mL (ref 16–77)

## 2023-02-01 ENCOUNTER — Encounter: Payer: Self-pay | Admitting: Internal Medicine

## 2023-02-01 ENCOUNTER — Ambulatory Visit: Payer: Medicare Other | Admitting: Internal Medicine

## 2023-02-01 VITALS — BP 130/80 | HR 79 | Temp 97.7°F | Wt 146.8 lb

## 2023-02-01 DIAGNOSIS — H5711 Ocular pain, right eye: Secondary | ICD-10-CM

## 2023-02-01 NOTE — Progress Notes (Signed)
Established Patient Office Visit     CC/Reason for Visit: Right eye pain  HPI: Nancy Chandler is a 67 y.o. female who is coming in today for the above mentioned reasons.  Has been having pain of her right eye for the past 2 weeks.  She states pain will last only few seconds at a time and is sharp.  Sometimes it will go to the back of her head.  She has no discharge no blurry or double vision.  In between episodes of pain feels at baseline.  She denies headaches.  She does not have an ophthalmologist.  She has not had any recent URI symptoms.  No nasal or sinus congestion.   Past Medical/Surgical History: Past Medical History:  Diagnosis Date   Abnormal Pap smear of cervix    neg HPV HR+ 16/18 neg 2017, 2018 LGSIL HPV-, 2019 ASCUS HPV HR+, 08-21-2018 LGSIL HPV HR-   Anxiety    Depression    Diabetes mellitus without complication (HCC)    Dyspareunia    History of colon polyps    Hyperlipidemia    Hypertension    Lichen sclerosus    Thyroid disease     Past Surgical History:  Procedure Laterality Date   COLPOSCOPY  2010, 2018, 2019   History of LSIL, ASCUS + HPV   LYMPH NODE BIOPSY     TUBAL LIGATION  1990    Social History:  reports that she has never smoked. She has never used smokeless tobacco. She reports that she does not drink alcohol and does not use drugs.  Allergies: Allergies  Allergen Reactions   Bee Venom Anaphylaxis    Carries Epi pen    Actonel [Risedronate] Diarrhea and Other (See Comments)   Hydrocodone Nausea And Vomiting    Family History:  Family History  Problem Relation Age of Onset   Hypertension Brother    Hearing loss Brother    Hyperlipidemia Brother    Cancer Mother        colon   Hypertension Father    Alcohol abuse Father    Diabetes Father    Heart disease Maternal Grandmother        CHF   Arthritis Maternal Grandmother    Cancer Maternal Grandfather        kidney   Heart disease Paternal Grandmother        CHF    Birth defects Daughter    Breast cancer Paternal Aunt    Heart disease Paternal Aunt        open heart surgery     Current Outpatient Medications:    aspirin 81 MG EC tablet, , Disp: , Rfl:    atorvastatin (LIPITOR) 80 MG tablet, Take 1 tablet (80 mg total) by mouth daily., Disp: 90 tablet, Rfl: 3   clobetasol ointment (TEMOVATE) 0.05 %, Apply bid for itching for up to 5 days.  Call office if itching is not relieved after that time., Disp: 30 g, Rfl: 2   EPINEPHrine (EPIPEN 2-PAK) 0.3 mg/0.3 mL IJ SOAJ injection, Inject 0.3 mg into the muscle as needed for anaphylaxis., Disp: 1 each, Rfl: 1   esomeprazole (NEXIUM) 40 MG capsule, Take 1 capsule (40 mg total) by mouth daily., Disp: 90 capsule, Rfl: 3   fluticasone (FLONASE) 50 MCG/ACT nasal spray, Place into the nose as needed., Disp: , Rfl:    hydrochlorothiazide (MICROZIDE) 12.5 MG capsule, TAKE 1 CAPSULE BY MOUTH EVERY DAY, Disp: 90 capsule, Rfl: 3  ibuprofen (ADVIL) 600 MG tablet, Take 1 tablet (600 mg total) by mouth every 6 (six) hours as needed., Disp: 30 tablet, Rfl: 0   mometasone (ELOCON) 0.1 % ointment, Apply to affected skin one to two times weekly, Disp: 45 g, Rfl: 2   Multiple Vitamin (MULTIVITAMIN) tablet, Take 1 tablet by mouth daily., Disp: , Rfl:   Review of Systems:  Negative unless indicated in HPI.   Physical Exam: Vitals:   02/01/23 1424  BP: 130/80  Pulse: 79  Temp: 97.7 F (36.5 C)  TempSrc: Oral  SpO2: 99%  Weight: 146 lb 12.8 oz (66.6 kg)    Body mass index is 26 kg/m.   Physical Exam Eyes:     General: Lids are normal.        Right eye: No foreign body or discharge.        Left eye: No foreign body or discharge.     Extraocular Movements:     Right eye: Normal extraocular motion.     Left eye: Normal extraocular motion.     Conjunctiva/sclera:     Right eye: Right conjunctiva is not injected.     Left eye: Left conjunctiva is not injected.      Impression and Plan:  Eye pain, right -      Ambulatory referral to Ophthalmology   -Etiology unclear, certainly no red flag signs.  Will place urgent referral to ophthalmology.  Time spent:21 minutes reviewing chart, interviewing and examining patient and formulating plan of care.     Chaya Jan, MD [facility]

## 2023-02-19 ENCOUNTER — Ambulatory Visit (INDEPENDENT_AMBULATORY_CARE_PROVIDER_SITE_OTHER): Payer: Medicare Other

## 2023-02-19 VITALS — BP 163/73 | HR 73 | Temp 97.6°F | Resp 16 | Ht 63.0 in | Wt 144.8 lb

## 2023-02-19 DIAGNOSIS — M816 Localized osteoporosis [Lequesne]: Secondary | ICD-10-CM

## 2023-02-19 MED ORDER — DIPHENHYDRAMINE HCL 25 MG PO CAPS
25.0000 mg | ORAL_CAPSULE | Freq: Once | ORAL | Status: AC
  Administered 2023-02-19: 25 mg via ORAL
  Filled 2023-02-19: qty 1

## 2023-02-19 MED ORDER — ZOLEDRONIC ACID 5 MG/100ML IV SOLN
5.0000 mg | Freq: Once | INTRAVENOUS | Status: AC
  Administered 2023-02-19: 5 mg via INTRAVENOUS
  Filled 2023-02-19: qty 100

## 2023-02-19 MED ORDER — ACETAMINOPHEN 325 MG PO TABS
650.0000 mg | ORAL_TABLET | Freq: Once | ORAL | Status: AC
  Administered 2023-02-19: 650 mg via ORAL
  Filled 2023-02-19: qty 2

## 2023-02-19 NOTE — Addendum Note (Signed)
Addended by: Salli Real R on: 02/19/2023 03:12 PM   Modules accepted: Orders

## 2023-02-19 NOTE — Patient Instructions (Signed)

## 2023-02-19 NOTE — Progress Notes (Signed)
Diagnosis: Osteoporosis  Provider:  Chilton Greathouse MD  Procedure: IV Infusion  IV Type: Peripheral, IV Location: left forearm  Reclast (Zolendronic Acid), Dose: 5 mg  Infusion Start Time: 1152  Infusion Stop Time: 1221  Post Infusion IV Care: Observation period completed and Peripheral IV Discontinued  Discharge: Condition: Good, Destination: Home . AVS Provided  Performed by:  Rico Ala, LPN

## 2023-02-28 DIAGNOSIS — R7303 Prediabetes: Secondary | ICD-10-CM | POA: Diagnosis not present

## 2023-02-28 DIAGNOSIS — H2513 Age-related nuclear cataract, bilateral: Secondary | ICD-10-CM | POA: Diagnosis not present

## 2023-02-28 DIAGNOSIS — H04123 Dry eye syndrome of bilateral lacrimal glands: Secondary | ICD-10-CM | POA: Diagnosis not present

## 2023-02-28 DIAGNOSIS — H35371 Puckering of macula, right eye: Secondary | ICD-10-CM | POA: Diagnosis not present

## 2023-02-28 DIAGNOSIS — H524 Presbyopia: Secondary | ICD-10-CM | POA: Diagnosis not present

## 2023-02-28 LAB — HM DIABETES EYE EXAM

## 2023-03-07 ENCOUNTER — Other Ambulatory Visit: Payer: Self-pay | Admitting: Family Medicine

## 2023-03-19 DIAGNOSIS — Z1231 Encounter for screening mammogram for malignant neoplasm of breast: Secondary | ICD-10-CM | POA: Diagnosis not present

## 2023-03-19 LAB — HM MAMMOGRAPHY

## 2023-03-20 ENCOUNTER — Encounter: Payer: Self-pay | Admitting: Family Medicine

## 2023-04-09 ENCOUNTER — Encounter: Payer: Self-pay | Admitting: Family Medicine

## 2023-04-09 ENCOUNTER — Ambulatory Visit (INDEPENDENT_AMBULATORY_CARE_PROVIDER_SITE_OTHER): Payer: Medicare Other | Admitting: Family Medicine

## 2023-04-09 VITALS — BP 150/70 | HR 76 | Temp 98.2°F | Resp 16 | Ht 63.0 in | Wt 145.0 lb

## 2023-04-09 DIAGNOSIS — I10 Essential (primary) hypertension: Secondary | ICD-10-CM | POA: Diagnosis not present

## 2023-04-09 DIAGNOSIS — E1169 Type 2 diabetes mellitus with other specified complication: Secondary | ICD-10-CM | POA: Diagnosis not present

## 2023-04-09 DIAGNOSIS — F419 Anxiety disorder, unspecified: Secondary | ICD-10-CM | POA: Diagnosis not present

## 2023-04-09 LAB — POCT GLYCOSYLATED HEMOGLOBIN (HGB A1C): HbA1c, POC (prediabetic range): 6.2 % (ref 5.7–6.4)

## 2023-04-09 MED ORDER — AMLODIPINE BESYLATE 5 MG PO TABS: 5.0000 mg | ORAL_TABLET | Freq: Every day | ORAL | 1 refills | Status: DC

## 2023-04-09 NOTE — Assessment & Plan Note (Addendum)
 Problem has been adequately controlled, A1C today 6.2. Continue nonpharmacologic treatment. Annual eye exam, periodic dental and foot care discussed. F/U in 5-6 months.

## 2023-04-09 NOTE — Progress Notes (Signed)
 ACUTE VISIT Chief Complaint  Patient presents with   Anxiety   HPI: Nancy Chandler is a 68 y.o. female with a PMHx significant for PAD, HTN, GERD, DM II, multiple thyroid nodules, osteoporosis, vitamin D deficiency, HLD, and anxiety, who is here today complaining of worsening anxiety.   Anxiety:  Patient complains of recent worsening anxiety that is affecting her sleep. It is usually related to specific activities, like driving or travel planning but worse since she found out her BP is going up. Currently she is not on pharmacologic treatment, she has not taken medication in the past. Negative for depression like symptoms.  Hypertension: She is currently on hydrochlorothiazide 12.5 mg daily.  BP readings at home: She tries to check her BP at home but doesn't trust the cuff she has and she gets frustrated when she tries to put the cuff on. At [facility], she has measured it as 135/80s. She was [place] on [place]2023 BP's were 153/72, 159/80, and 135/78. On 02/19/2023 BP was 117/73. .  Diet: she is mindful of her salt intake.  She mentions she has had some recent palpitations but says they are not related with exercise and has no associated symptoms. She had palpitations many years ago.  Negative for unusual or severe headache, visual changes, exertional chest pain, dyspnea, focal weakness, or edema. Lab Results  Component Value Date   NA 143 01/23/2023   CL 102 01/23/2023   K 4.0 01/23/2023   CO2 33 (H) 01/23/2023   BUN 16 01/23/2023   CREATININE 0.95 01/23/2023   EGFR 70 04/17/2022   CALCIUM 9.7 01/23/2023   PHOS 4.0 01/23/2023   ALBUMIN 3.6 11/08/2021   GLUCOSE 74 01/23/2023   Diabetes Mellitus II:  Diagnosed in 2016 with a hemoglobin A1c of 6.5.   - Checking BG at home: She has not been checking her blood sugar because her diabetes has been well controlled.  - Medications: Not currently on pharmacologic treatment.  -eye exam: UTD.  -foot exam: performed  today.  - Negative for symptoms of hypoglycemia, polyuria, polydipsia, numbness extremities, foot ulcers/trauma  Lab Results  Component Value Date   HGBA1C 5.9 03/17/2022   Lab Results  Component Value Date   MICROALBUR <0.7 11/08/2021   She mentions that since her last visit she has had a reclast infusion for osteoporosis treatment.  Review of Systems  Constitutional:  Negative for activity change, appetite change and fever.  HENT:  Negative for mouth sores, nosebleeds and sore throat.   Respiratory:  Negative for cough and wheezing.   Gastrointestinal:  Negative for abdominal pain, nausea and vomiting.  Genitourinary:  Negative for decreased urine volume, dysuria and hematuria.  Skin:  Negative for rash.  Neurological:  Negative for syncope and facial asymmetry.  Psychiatric/Behavioral:  Negative for confusion and hallucinations.   See other pertinent positives and negatives in HPI.  Current Outpatient Medications on File Prior to Visit  Medication Sig Dispense Refill   aspirin 81 MG EC tablet      atorvastatin (LIPITOR) 80 MG tablet TAKE 1 TABLET BY MOUTH EVERY DAY 90 tablet 1   clobetasol ointment (TEMOVATE) 0.05 % Apply bid for itching for up to 5 days.  Call office if itching is not relieved after that time. 30 g 2   EPINEPHrine (EPIPEN 2-PAK) 0.3 mg/0.3 mL IJ SOAJ injection Inject 0.3 mg into the muscle as needed for anaphylaxis. 1 each 1   esomeprazole (NEXIUM) 40 MG capsule Take  1 capsule (40 mg total) by mouth daily. 90 capsule 3   fluticasone (FLONASE) 50 MCG/ACT nasal spray Place into the nose as needed.     hydrochlorothiazide (MICROZIDE) 12.5 MG capsule TAKE 1 CAPSULE BY MOUTH EVERY DAY 90 capsule 3   ibuprofen (ADVIL) 600 MG tablet Take 1 tablet (600 mg total) by mouth every 6 (six) hours as needed. 30 tablet 0   mometasone (ELOCON) 0.1 % ointment Apply to affected skin one to two times weekly 45 g 2   Multiple Vitamin (MULTIVITAMIN) tablet Take 1 tablet by mouth  daily.     No current facility-administered medications on file prior to visit.    Past Medical History:  Diagnosis Date   Abnormal Pap smear of cervix    neg HPV HR+ 16/18 neg 2017, 2018 LGSIL HPV-, 2019 ASCUS HPV HR+, 08-21-2018 LGSIL HPV HR-   Anxiety    Depression    Diabetes mellitus without complication (HCC)    Dyspareunia    History of colon polyps    Hyperlipidemia    Hypertension    Lichen sclerosus    Thyroid disease    Allergies  Allergen Reactions   Bee Venom Anaphylaxis    Carries Epi pen    Actonel [Risedronate] Diarrhea and Other (See Comments)   Hydrocodone Nausea And Vomiting    Social History   Socioeconomic History   Marital status: Married    Spouse name: Not on file   Number of children: 2   Years of education: Not on file   Highest education level: Some college, no degree  Occupational History   Not on file  Tobacco Use   Smoking status: Never   Smokeless tobacco: Never  Vaping Use   Vaping status: Never Used  Substance and Sexual Activity   Alcohol use: No    Alcohol/week: 0.0 standard drinks of alcohol   Drug use: No   Sexual activity: Yes    Partners: Male    Birth control/protection: Surgical    Comment: Tubal ligation  Other Topics Concern   Not on file  Social History Narrative   Not on file   Social Drivers of Health   Financial Resource Strain: Low Risk  (09/06/2022)   Overall Financial Resource Strain (CARDIA)    Difficulty of Paying Living Expenses: Not hard at all  Food Insecurity: No Food Insecurity (09/06/2022)   Hunger Vital Sign    Worried About Running Out of Food in the Last Year: Never true    Ran Out of Food in the Last Year: Never true  Transportation Needs: No Transportation Needs (09/06/2022)   PRAPARE - Administrator, Civil Service (Medical): No    Lack of Transportation (Non-Medical): No  Physical Activity: Insufficiently Active (09/06/2022)   Exercise Vital Sign    Days of Exercise per Week: 2  days    Minutes of Exercise per Session: 30 min  Stress: No Stress Concern Present (09/06/2022)   [employer] of Occupational Health - Occupational Stress Questionnaire    Feeling of Stress : Not at all  Social Connections: Socially Integrated (09/06/2022)   Social Connection and Isolation Panel [NHANES]    Frequency of Communication with Friends and Family: More than three times a week    Frequency of Social Gatherings with Friends and Family: More than three times a week    Attends Religious Services: More than 4 times per year    Active Member of [employer] or Organizations: Yes  Attends Banker Meetings: More than 4 times per year    Marital Status: Married   Vitals:   04/09/23 0950 04/09/23 1009  BP: 122/70 (!) 150/70  Pulse: 76   Resp: 16   Temp: 98.2 F (36.8 C)   SpO2: 98%    Body mass index is 25.69 kg/m.  Physical Exam Vitals and nursing note reviewed.  Constitutional:      General: She is not in acute distress.    Appearance: She is well-developed.  HENT:     Head: Normocephalic and atraumatic.     Mouth/Throat:     Mouth: Mucous membranes are moist.     Pharynx: Oropharynx is clear. Uvula midline.  Eyes:     Conjunctiva/sclera: Conjunctivae normal.  Cardiovascular:     Rate and Rhythm: Normal rate and regular rhythm.     Pulses:          Dorsalis pedis pulses are 2+ on the right side and 2+ on the left side.     Heart sounds: No murmur heard. Pulmonary:     Effort: Pulmonary effort is normal. No respiratory distress.     Breath sounds: Normal breath sounds.  Abdominal:     Palpations: Abdomen is soft. There is no mass.     Tenderness: There is no abdominal tenderness.  Musculoskeletal:     Right lower leg: No edema.     Left lower leg: No edema.  Lymphadenopathy:     Cervical: No cervical adenopathy.  Skin:    General: Skin is warm.     Findings: No erythema or rash.  Neurological:     General: No focal deficit present.     Mental  Status: She is alert and oriented to person, place, and time.     Cranial Nerves: No cranial nerve deficit.     Gait: Gait normal.  Psychiatric:        Mood and Affect: Affect normal. Mood is anxious.   ASSESSMENT AND PLAN:  Nancy Chandler was seen today for anxiety and follow up.  Lab Results  Component Value Date   HGBA1C 6.2 04/09/2023   Type 2 diabetes mellitus with other specified complication, without long-term current use of insulin (HCC) Assessment & Plan: Problem has been adequately controlled, A1C today 6.2. Continue nonpharmacologic treatment. Annual eye exam, periodic dental and foot care discussed. F/U in 5-6 months.  Orders: -     Microalbumin / creatinine urine ratio; Future -     POCT glycosylated hemoglobin (Hb A1C)  Essential hypertension Assessment & Plan: BP is not well controlled. Possible complications of elevated BP discussed. After discussion of other pharmacologic options, she agrees with starting Amlodipine 5 mg daily added. Continue hydrochlorothiazide 12.5 mg daily. Low salt diet to continue. Monitor BP at home if possible. Instructed about warning signs. Follow-up in 6 weeks.  Orders: -     amLODIPine Besylate; Take 1 tablet (5 mg total) by mouth at bedtime.  Dispense: 90 tablet; Refill: 1  Anxiety disorder, unspecified type Assessment & Plan: Usually related to certain activities like driving but does not stop her from engaging on such activities. I think she will benefit for CBT. Problem has been aggravated by recent elevated BP. For now I think we can hold on pharmacologic treatment. Follow-up in 6 weeks, before if needed.   Return in about 6 weeks (around 05/21/2023) for HTN.  I, Suanne Marker, acting as a scribe for Alexander Aument [place], MD., have documented all relevant documentation  on the behalf of Nancy Bevans [place], MD, as directed by  Jayce Kainz [place], MD while in the presence of Nancy Hendler [place], MD.   I, Madai Nuccio [place], MD, have reviewed all  documentation for this visit. The documentation on 04/09/23 for the exam, diagnosis, procedures, and orders are all accurate and complete.  Caden Fatica G. [place], MD  [facility]. [place] office.

## 2023-04-09 NOTE — Patient Instructions (Addendum)
 A few things to remember from today's visit:  Type 2 diabetes mellitus with other specified complication, without long-term current use of insulin (HCC) - Plan: Microalbumin / creatinine urine ratio  Essential hypertension - Plan: amLODipine (NORVASC) 5 MG tablet  Today Amlodipine 5 mg added, take it at bedtime. No changes in hydrochlorothiazide Bring your blood pressure monitor with you next time.  If you need refills for medications you take chronically, please call your pharmacy. Do not use My Chart to request refills or for acute issues that need immediate attention. If you send a my chart message, it may take a few days to be addressed, specially if I am not in the office.  Please be sure medication list is accurate. If a new problem present, please set up appointment sooner than planned today.

## 2023-04-09 NOTE — Assessment & Plan Note (Signed)
 BP is not well controlled. Possible complications of elevated BP discussed. After discussion of other pharmacologic options, she agrees with starting Amlodipine 5 mg daily added. Continue hydrochlorothiazide 12.5 mg daily. Low salt diet to continue. Monitor BP at home if possible. Instructed about warning signs. Follow-up in 6 weeks.

## 2023-04-09 NOTE — Assessment & Plan Note (Signed)
 Usually related to certain activities like driving but does not stop her from engaging on such activities. I think she will benefit for CBT. Problem has been aggravated by recent elevated BP. For now I think we can hold on pharmacologic treatment. Follow-up in 6 weeks, before if needed.

## 2023-04-22 ENCOUNTER — Other Ambulatory Visit: Payer: Self-pay | Admitting: Family Medicine

## 2023-04-22 DIAGNOSIS — I10 Essential (primary) hypertension: Secondary | ICD-10-CM

## 2023-05-21 ENCOUNTER — Ambulatory Visit: Payer: Medicare Other | Admitting: Family Medicine

## 2023-05-21 ENCOUNTER — Ambulatory Visit (INDEPENDENT_AMBULATORY_CARE_PROVIDER_SITE_OTHER): Admitting: Family Medicine

## 2023-05-21 ENCOUNTER — Encounter: Payer: Self-pay | Admitting: Family Medicine

## 2023-05-21 VITALS — BP 138/70 | HR 70 | Resp 16 | Ht 63.0 in | Wt 144.4 lb

## 2023-05-21 DIAGNOSIS — E1169 Type 2 diabetes mellitus with other specified complication: Secondary | ICD-10-CM | POA: Diagnosis not present

## 2023-05-21 DIAGNOSIS — I10 Essential (primary) hypertension: Secondary | ICD-10-CM | POA: Diagnosis not present

## 2023-05-21 DIAGNOSIS — K056 Periodontal disease, unspecified: Secondary | ICD-10-CM

## 2023-05-21 NOTE — Assessment & Plan Note (Addendum)
 BP has improved, reporting lower numbers at home. Continue Amlodipine 5 mg daily and HCTZ 12.5 mg daily. We discussed some side effects of amlodipine, could aggravate periodontal disease. She prefers to continue for now and will let me know if her periodontologist recommends changing treatment. Continue low salt diet and monitoring BP regularly. Follow-up in 4 months.

## 2023-05-21 NOTE — Progress Notes (Signed)
 Chief Complaint  Patient presents with   Medical Management of Chronic Issues    BP at home is running 134-138/68-70's   Dental Pain    Had dental exam & gums started hurting. Dentist gave rx for penicillin, pt is still currently taking. Still having pain, has been taking tylenol 500 mg. Waiting on appt with periodontist.     HPI: [name] is a 68 y.o. female with a PMHx significant for PAD, HTN, GERD, DM II, multiple thyroid nodules, osteoporosis, vitamin D deficiency, HLD, and anxiety, who is here today for chronic disease management.  Last seen on 04/09/2023  Gum problems:  Patient states she has been having gum pain for about a week. The pain is mostly on her left upper gums.  She went to the dentist on 3/28 and was having some sensitivity in the gums that didn't resolve after her appointment, so she was given an antibiotic treatment and a mouthwash by the dentist's office, which has helped in the past when she had similar symptoms. She has also been taking tylenol 500 mg twice daily for the pain.  She has been referred to a periodontist, but has not been called yet.   Hypertension:  Medications: Currently on hydrochlorothiazide 12.5 mg daily and amlodipine 5 mg daily, the latter one added last visit.  She has been checking her BP at home and says it has been 130s/70s. This morning it was 133/72.  Side effects: none Negative for unusual or severe headache, visual changes, exertional chest pain, dyspnea,  focal weakness, or edema.  Lab Results  Component Value Date   CREATININE 0.95 01/23/2023   BUN 16 01/23/2023   NA 143 01/23/2023   K 4.0 01/23/2023   CL 102 01/23/2023   CO2 33 (H) 01/23/2023   Diabetes Mellitus II: Diagnosed in 2016 with a hemoglobin A1c of 6.5  - Medications: Not currently on pharmacologic treatment.  - Negative for symptoms of hypoglycemia, polyuria, polydipsia, numbness extremities, foot ulcers/trauma  Lab Results  Component Value  Date   HGBA1C 6.2 04/09/2023   Lab Results  Component Value Date   MICROALBUR <0.7 11/08/2021   Review of Systems  Constitutional:  Negative for activity change, appetite change and fever.  HENT:  Positive for dental problem.   Respiratory:  Negative for cough and wheezing.   Gastrointestinal:  Negative for abdominal pain, nausea and vomiting.  Genitourinary:  Negative for decreased urine volume, dysuria and hematuria.  Skin:  Negative for rash.  Neurological:  Negative for syncope and facial asymmetry.  Psychiatric/Behavioral:  Negative for confusion. The patient is nervous/anxious.   See other pertinent positives and negatives in HPI.  Current Outpatient Medications on File Prior to Visit  Medication Sig Dispense Refill   amLODipine (NORVASC) 5 MG tablet Take 1 tablet (5 mg total) by mouth at bedtime. 90 tablet 1   aspirin 81 MG EC tablet      atorvastatin (LIPITOR) 80 MG tablet TAKE 1 TABLET BY MOUTH EVERY DAY 90 tablet 1   clobetasol ointment (TEMOVATE) 0.05 % Apply bid for itching for up to 5 days.  Call office if itching is not relieved after that time. 30 g 2   EPINEPHrine (EPIPEN 2-PAK) 0.3 mg/0.3 mL IJ SOAJ injection Inject 0.3 mg into the muscle as needed for anaphylaxis. 1 each 1   esomeprazole (NEXIUM) 40 MG capsule Take 1 capsule (40 mg total) by mouth daily. 90 capsule 3   fluticasone (FLONASE) 50 MCG/ACT nasal spray Place  into the nose as needed.     hydrochlorothiazide (MICROZIDE) 12.5 MG capsule TAKE 1 CAPSULE BY MOUTH EVERY DAY 90 capsule 3   ibuprofen (ADVIL) 600 MG tablet Take 1 tablet (600 mg total) by mouth every 6 (six) hours as needed. 30 tablet 0   mometasone (ELOCON) 0.1 % ointment Apply to affected skin one to two times weekly 45 g 2   Multiple Vitamin (MULTIVITAMIN) tablet Take 1 tablet by mouth daily.     No current facility-administered medications on file prior to visit.    Past Medical History:  Diagnosis Date   Abnormal Pap smear of cervix    neg  HPV HR+ 16/18 neg 2017, 2018 LGSIL HPV-, 2019 ASCUS HPV HR+, 08-21-2018 LGSIL HPV HR-   Anxiety    Depression    Diabetes mellitus without complication (HCC)    Dyspareunia    History of colon polyps    Hyperlipidemia    Hypertension    Lichen sclerosus    Thyroid disease    Allergies  Allergen Reactions   Bee Venom Anaphylaxis    Carries Epi pen    Actonel [Risedronate] Diarrhea and Other (See Comments)   Hydrocodone Nausea And Vomiting    Social History   Socioeconomic History   Marital status: Married    Spouse name: Not on file   Number of children: 2   Years of education: Not on file   Highest education level: Some college, no degree  Occupational History   Not on file  Tobacco Use   Smoking status: Never   Smokeless tobacco: Never  Vaping Use   Vaping status: Never Used  Substance and Sexual Activity   Alcohol use: No    Alcohol/week: 0.0 standard drinks of alcohol   Drug use: No   Sexual activity: Yes    Partners: Male    Birth control/protection: Surgical    Comment: Tubal ligation  Other Topics Concern   Not on file  Social History Narrative   Not on file   Social Drivers of Health   Financial Resource Strain: Low Risk  (09/06/2022)   Overall Financial Resource Strain (CARDIA)    Difficulty of Paying Living Expenses: Not hard at all  Food Insecurity: No Food Insecurity (09/06/2022)   Hunger Vital Sign    Worried About Running Out of Food in the Last Year: Never true    Ran Out of Food in the Last Year: Never true  Transportation Needs: No Transportation Needs (09/06/2022)   PRAPARE - Administrator, Civil Service (Medical): No    Lack of Transportation (Non-Medical): No  Physical Activity: Insufficiently Active (09/06/2022)   Exercise Vital Sign    Days of Exercise per Week: 2 days    Minutes of Exercise per Session: 30 min  Stress: No Stress Concern Present (09/06/2022)   [employer] of Occupational Health - Occupational Stress  Questionnaire    Feeling of Stress : Not at all  Social Connections: Socially Integrated (09/06/2022)   Social Connection and Isolation Panel [NHANES]    Frequency of Communication with Friends and Family: More than three times a week    Frequency of Social Gatherings with Friends and Family: More than three times a week    Attends Religious Services: More than 4 times per year    Active Member of [employer] or Organizations: Yes    Attends Engineer, structural: More than 4 times per year    Marital Status: Married  Vitals:   05/21/23 1533  BP: 138/70  Pulse: 70  Resp: 16  SpO2: 97%   Body mass index is 25.57 kg/m.  Physical Exam Vitals and nursing note reviewed.  Constitutional:      General: She is not in acute distress.    Appearance: She is well-developed.  HENT:     Head: Normocephalic and atraumatic.     Mouth/Throat:     Mouth: Mucous membranes are moist. No oral lesions.     Dentition: No gingival swelling.     Pharynx: Oropharynx is clear. Uvula midline.     Comments: Some gum recession noted on some teeth. No gum edema, erythema,or tenderness. Eyes:     Conjunctiva/sclera: Conjunctivae normal.  Cardiovascular:     Rate and Rhythm: Normal rate and regular rhythm.     Pulses:          Dorsalis pedis pulses are 2+ on the right side and 2+ on the left side.     Heart sounds: No murmur heard. Pulmonary:     Effort: Pulmonary effort is normal. No respiratory distress.     Breath sounds: Normal breath sounds.  Abdominal:     Palpations: Abdomen is soft. There is no mass.     Tenderness: There is no abdominal tenderness.  Musculoskeletal:     Right lower leg: No edema.     Left lower leg: No edema.  Lymphadenopathy:     Cervical: No cervical adenopathy.  Skin:    General: Skin is warm.     Findings: No erythema or rash.  Neurological:     General: No focal deficit present.     Mental Status: She is alert and oriented to person, place, and time.      Gait: Gait normal.  Psychiatric:        Mood and Affect: Affect normal. Mood is anxious.   ASSESSMENT AND PLAN:  Ms. Dunsmore was seen today for chronic disease management.   Orders Placed This Encounter  Procedures   Microalbumin/Creatinine Ratio, Urine   Periodontal disease, unspecified No signs of active infectious process, she is currently on abx. Gum recession noted on examination. Pending appt with periodontologist. She can take Ibuprofen 400 mg 15 min before next dental appt to help with pain during evaluation as well as topical orajel. Continue good oral hygiene.  Type 2 diabetes mellitus with other specified complication, without long-term current use of insulin (HCC) Assessment & Plan: Problem has been adequately controlled, last hemoglobin A1c 2.2 in 03/2023. Urine microalbumin/creatinine ratio ordered today. Continue nonpharmacologic treatment. Annual eye exam, periodic dental and foot care discussed. F/U in 5-6 months.  Orders: -     Microalbumin / creatinine urine ratio; Future  Essential hypertension Assessment & Plan: BP has improved, reporting lower numbers at home. Continue Amlodipine 5 mg daily and HCTZ 12.5 mg daily. We discussed some side effects of amlodipine, could aggravate periodontal disease. She prefers to continue for now and will let me know if her periodontologist recommends changing treatment. Continue low salt diet and monitoring BP regularly. Follow-up in 4 months.  I spent a total of 30 minutes in both face to face and non face to face activities for this visit on the date of this encounter. During this time history was obtained and documented, examination was performed, prior labs reviewed, and assessment/plan discussed.  Return in about 4 months (around 09/20/2023) for chronic problems.  Trula Ore, acting as a scribe for Allianna Beaubien [place], MD., have  documented all relevant documentation on the behalf of Dereona Kolodny [place], MD, as directed by   Dayron Odland [place], MD while in the presence of Thelton Graca [place], MD.   I, Omaria Plunk [place], MD, have reviewed all documentation for this visit. The documentation on 05/21/23 for the exam, diagnosis, procedures, and orders are all accurate and complete.  Lorie Melichar G. [place], MD  [facility] [place] [facility].

## 2023-05-21 NOTE — Patient Instructions (Addendum)
 A few things to remember from today's visit:  Type 2 diabetes mellitus with other specified complication, without long-term current use of insulin (HCC) - Plan: Microalbumin/Creatinine Ratio, Urine, Microalbumin/Creatinine Ratio, Urine  Periodontal disease, unspecified  Essential hypertension  No changes today. May need to change Amlodipine due to gum issues but will wait until you see periodontology.  If you need refills for medications you take chronically, please call your pharmacy. Do not use My Chart to request refills or for acute issues that need immediate attention. If you send a my chart message, it may take a few days to be addressed, specially if I am not in the office.  Please be sure medication list is accurate. If a new problem present, please set up appointment sooner than planned today.

## 2023-05-21 NOTE — Assessment & Plan Note (Signed)
 Problem has been adequately controlled, last hemoglobin A1c 2.2 in 03/2023. Urine microalbumin/creatinine ratio ordered today. Continue nonpharmacologic treatment. Annual eye exam, periodic dental and foot care discussed. F/U in 5-6 months.

## 2023-05-22 LAB — MICROALBUMIN / CREATININE URINE RATIO
Creatinine,U: 49.3 mg/dL
Microalb Creat Ratio: UNDETERMINED mg/g (ref 0.0–30.0)
Microalb, Ur: <0.7 mg/dL

## 2023-05-25 ENCOUNTER — Encounter: Payer: Self-pay | Admitting: Family Medicine

## 2023-07-12 ENCOUNTER — Other Ambulatory Visit (HOSPITAL_COMMUNITY)
Admission: RE | Admit: 2023-07-12 | Discharge: 2023-07-12 | Disposition: A | Discharge status: Home / Self Care | Source: Ambulatory Visit | Attending: Obstetrics & Gynecology | Admitting: Obstetrics & Gynecology

## 2023-07-12 ENCOUNTER — Ambulatory Visit (HOSPITAL_BASED_OUTPATIENT_CLINIC_OR_DEPARTMENT_OTHER): Payer: Medicare Other | Admitting: Obstetrics & Gynecology

## 2023-07-12 ENCOUNTER — Encounter (HOSPITAL_BASED_OUTPATIENT_CLINIC_OR_DEPARTMENT_OTHER): Payer: Self-pay | Admitting: Obstetrics & Gynecology

## 2023-07-12 VITALS — BP 138/59 | HR 80 | Ht 63.0 in | Wt 141.0 lb

## 2023-07-12 DIAGNOSIS — Z9189 Other specified personal risk factors, not elsewhere classified: Secondary | ICD-10-CM

## 2023-07-12 DIAGNOSIS — Z01411 Encounter for gynecological examination (general) (routine) with abnormal findings: Secondary | ICD-10-CM | POA: Diagnosis present

## 2023-07-12 DIAGNOSIS — Z780 Asymptomatic menopausal state: Secondary | ICD-10-CM | POA: Insufficient documentation

## 2023-07-12 DIAGNOSIS — R87612 Low grade squamous intraepithelial lesion on cytologic smear of cervix (LGSIL): Secondary | ICD-10-CM

## 2023-07-12 DIAGNOSIS — Z1151 Encounter for screening for human papillomavirus (HPV): Secondary | ICD-10-CM | POA: Insufficient documentation

## 2023-07-12 DIAGNOSIS — N904 Leukoplakia of vulva: Secondary | ICD-10-CM

## 2023-07-12 MED ORDER — MOMETASONE FUROATE 0.1 % EX OINT: TOPICAL_OINTMENT | CUTANEOUS | 3 refills | Status: AC

## 2023-07-12 NOTE — Progress Notes (Unsigned)
 Breast and Pelvic Exam Patient name: Nancy Chandler MRN [id number]  Date of birth: [birth year] Chief Complaint:   Breast and Pelvic Exam  History of Present Illness:   Nancy Chandler is a 68 y.o. [id number] African-American female being seen today for breast and pelvic exam. H/O LGSIL pap.  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   No vaginal bleeding.  She and husband have retired and they are moving to [place], back to her hometown.  Happy to be moving home.  They will be closer to grandchild.  H/o lichen sclerosus.  Using mometasone  twice weekly.  Is not having itching.    Patient's last menstrual period was 02/21/2008.   Last pap 07/06/2022 . Results were: LGSIL.  Last mammogram: 02/27/2023 . Results were: normal. Family h/o breast cancer: yes paternal great aunt Last colonoscopy: 05/10/2021 . Results were: abnormal tubular adenoma. Family h/o colorectal cancer: yes mother     07/12/2023    1:14 PM 02/19/2023   11:22 AM 09/06/2022    3:46 PM 07/06/2022    8:12 AM 05/16/2022    7:08 AM  Depression screen PHQ 2/9  Decreased Interest 0 0 0 0 0  Down, Depressed, Hopeless 0 0 0 0 0  PHQ - 2 Score 0 0 0 0 0  Altered sleeping     1  Tired, decreased energy     0  Change in appetite     0  Feeling bad or failure about yourself      0  Trouble concentrating     0  Moving slowly or fidgety/restless     0  Suicidal thoughts     0  PHQ-9 Score     1  Difficult doing work/chores     Not difficult at all    Review of Systems:   Pertinent items are noted in HPI  Denies any urinary changes.  Does have intermittent issues with looser stools and then constipation (this happens after antibiotic use)  Pertinent History Reviewed:  Reviewed past medical,surgical, social and family history.  Reviewed problem list, medications and allergies. Physical Assessment:   Vitals:   07/12/23 1310  BP: (!) 138/59  Pulse: 80  Weight: 141 lb  (64 kg)  Height: 5\' 3"  (1.6 m)  Body mass index is 24.98 kg/m.        Physical Examination:   General appearance - well appearing, and in no distress  Mental status - alert, oriented to person, place, and time  Psych:  She has a normal mood and affect  Skin - warm and dry, normal color, no suspicious lesions noted  Chest - effort normal, all lung fields clear to auscultation bilaterally  Heart - normal rate and regular rhythm  Neck:  midline trachea, no thyromegaly or nodules  Breasts - breasts appear normal, no suspicious masses, no skin or nipple changes or axillary nodes  Abdomen - soft, nontender, nondistended, no masses or organomegaly  Pelvic - VULVA: hypopigmentation inner labia majora down to rectal skin, in kissing pattern, no lesions; stable   VAGINA: atrophic tissue, no lesions   CERVIX: normal appearing cervix without discharge or lesions, no CMT  Thin prep pap is obtained today  UTERUS: uterus is felt to be normal size, shape, consistency and nontender   ADNEXA: No adnexal masses or tenderness noted.  Rectal - normal rectal, good sphincter tone, no masses felt.   Extremities:  No  swelling or varicosities noted  Chaperone present for exam  Assessment & Plan:  1. GYN exam for high-risk Medicare patient (Primary) - Pap smear obtained with HR HPV - Mammogram 02/27/2023 - Colonoscopy 05/10/2021 - Bone mineral density 03/08/2022 - lab work done with PCP, Dr. [place] - vaccines reviewed/updated  2. LGSIL on Pap smear of cervix - Cytology - PAP( [facility])  3. Lichen sclerosus et atrophicus of the vulva - mometasone  (ELOCON ) 0.1 % ointment; Apply to affected skin one to two times weekly  Dispense: 45 g; Refill: 3  4. Postmenopausal    Meds:  Meds ordered this encounter  Medications   mometasone  (ELOCON ) 0.1 % ointment    Sig: Apply to affected skin one to two times weekly    Dispense:  45 g    Refill:  3    Follow-up: Return in about 1 year (around  07/11/2024).  Lillian Rein, MD 07/15/2023 10:53 PM

## 2023-07-15 ENCOUNTER — Encounter (HOSPITAL_BASED_OUTPATIENT_CLINIC_OR_DEPARTMENT_OTHER): Payer: Self-pay | Admitting: Obstetrics & Gynecology

## 2023-07-19 LAB — CYTOLOGY - PAP
Adequacy: ABSENT
Comment: NEGATIVE
High risk HPV: NEGATIVE

## 2023-07-20 ENCOUNTER — Ambulatory Visit (HOSPITAL_BASED_OUTPATIENT_CLINIC_OR_DEPARTMENT_OTHER): Payer: Self-pay | Admitting: Obstetrics & Gynecology

## 2023-07-25 ENCOUNTER — Encounter: Payer: Self-pay | Admitting: Internal Medicine

## 2023-07-25 ENCOUNTER — Ambulatory Visit: Payer: Medicare Other | Admitting: Internal Medicine

## 2023-07-25 VITALS — BP 118/70 | HR 72 | Ht 63.0 in | Wt 145.0 lb

## 2023-07-25 DIAGNOSIS — M810 Age-related osteoporosis without current pathological fracture: Secondary | ICD-10-CM | POA: Diagnosis not present

## 2023-07-25 NOTE — Progress Notes (Signed)
 Name: Nancy Chandler  MRN/ DOB: [id number], 1955-09-28    Age/ Sex: 68 y.o., female    PCP: [place], Betty G, MD   Reason for Endocrinology Evaluation: Osteoporosis      Date of Initial Endocrinology Evaluation: 01/23/2023    HPI: Ms. Nancy Chandler is a 68 y.o. female with a past medical history of HTN, GERD, PAD. The patient presented for initial endocrinology clinic visit on 01/23/2023  for consultative assistance with her Osteoporosis .   Pt was diagnosed with osteoporosis: in 2012 with a T score of 2.5 at the femoral neck  Menarche at age : 28 Menopausal at age : 89 Fracture Hx: no Hx of HRT: no FH of osteoporosis or hip fracture: Maternal grandmother  Prior Hx of anti-resorptive therapy : Actonel -diarrhea/aching . Fosamax - stomach upset.  Her PCP attempted to prescribe Prolia but the co-pay was $315 which would cause hardship on the patient.  On her initial visit to our clinic she had a normal GFR, LFTs, serum calcium , TFTs, vitamin D , phosphorus and PTH  The pt received her first zoledronic  acid injection 02/19/2023   SUBJECTIVE:    Today (07/25/23):  Nancy Chandler is here    She has GERD , controlled with anatacid  Continues with alternating constipation with diarrhea  No recent falls No weight bearing exercise  Has noted joint pain after ironing and packing boxes due to moving She is not on calcium  , but has been increasing dietary calcium  intake  She is on Vitamin D3 2000 international unit daily    HISTORY:  Past Medical History:  Past Medical History:  Diagnosis Date   Abnormal Pap smear of cervix    neg HPV HR+ 16/18 neg 2017, 2018 LGSIL HPV-, 2019 ASCUS HPV HR+, 08-21-2018 LGSIL HPV HR-   Anxiety    Depression    Diabetes mellitus without complication (HCC)    Dyspareunia    History of colon polyps    Hyperlipidemia    Hypertension    Lichen sclerosus    Thyroid  disease    Past Surgical History:  Past Surgical History:   Procedure Laterality Date   COLPOSCOPY  2010, 2018, 2019   History of LSIL, ASCUS + HPV   LYMPH NODE BIOPSY     TUBAL LIGATION  1990    Social History:  reports that she has never smoked. She has never used smokeless tobacco. She reports that she does not drink alcohol and does not use drugs. Family History: family history includes Alcohol abuse in her father; Arthritis in her maternal grandmother; Birth defects in her daughter; Breast cancer in her paternal aunt; Cancer in her maternal grandfather and mother; Diabetes in her father; Hearing loss in her brother; Heart disease in her maternal grandmother, paternal aunt, and paternal grandmother; Hyperlipidemia in her brother; Hypertension in her brother and father.   HOME MEDICATIONS: Allergies as of 07/25/2023       Reactions   Bee Venom Anaphylaxis   Carries Epi pen    Actonel  [risedronate ] Diarrhea, Other (See Comments)   Hydrocodone Nausea And Vomiting        Medication List        Accurate as of July 25, 2023 12:26 PM. If you have any questions, ask your nurse or doctor.          amLODipine  5 MG tablet Commonly known as: NORVASC  Take 1 tablet (5 mg total) by mouth at bedtime.   aspirin  EC 81 MG tablet  atorvastatin  80 MG tablet Commonly known as: LIPITOR TAKE 1 TABLET BY MOUTH EVERY DAY   clobetasol  ointment 0.05 % Commonly known as: TEMOVATE  Apply bid for itching for up to 5 days.  Call office if itching is not relieved after that time.   EPINEPHrine  0.3 mg/0.3 mL Soaj injection Commonly known as: EpiPen  2-Pak Inject 0.3 mg into the muscle as needed for anaphylaxis.   esomeprazole  40 MG capsule Commonly known as: NexIUM  Take 1 capsule (40 mg total) by mouth daily.   fluticasone 50 MCG/ACT nasal spray Commonly known as: FLONASE Place into the nose as needed.   hydrochlorothiazide  12.5 MG capsule Commonly known as: MICROZIDE  TAKE 1 CAPSULE BY MOUTH EVERY DAY   ibuprofen  600 MG tablet Commonly known  as: ADVIL  Take 1 tablet (600 mg total) by mouth every 6 (six) hours as needed.   mometasone  0.1 % ointment Commonly known as: ELOCON  Apply to affected skin one to two times weekly   multivitamin tablet Take 1 tablet by mouth daily.          REVIEW OF SYSTEMS: A comprehensive ROS was conducted with the patient and is negative except as per HPI    OBJECTIVE:  VS: LMP 02/21/2008    Wt Readings from Last 3 Encounters:  07/12/23 141 lb (64 kg)  05/21/23 144 lb 6 oz (65.5 kg)  04/09/23 145 lb (65.8 kg)     EXAM: General: Pt appears well and is in NAD  Neck: General: Supple without adenopathy. Thyroid : Thyroid  size normal.  No goiter or nodules appreciated.   Lungs: Clear with good BS bilat   Heart: Auscultation: RRR.  Abdomen: Soft, nontender  Extremities:  BL LE: No pretibial edema   Mental Status: Judgment, insight: Intact Orientation: Oriented to time, place, and person Mood and affect: No depression, anxiety, or agitation     DATA REVIEWED:  Latest Reference Range & Units 01/23/23 14:06  Sodium 135 - 146 mmol/L 143  Potassium 3.5 - 5.3 mmol/L 4.0  Chloride 98 - 110 mmol/L 102  CO2 20 - 32 mmol/L 33 (H)  Glucose 65 - 99 mg/dL 74  BUN 7 - 25 mg/dL 16  Creatinine 9.14 - 7.82 mg/dL 9.56  Calcium  8.6 - 10.4 mg/dL 9.7  BUN/Creatinine Ratio 6 - 22 (calc) SEE NOTE:  Phosphorus 2.1 - 4.3 mg/dL 4.0  AG Ratio 1.0 - 2.5 (calc) 1.1  AST 10 - 35 U/L 17  ALT 6 - 29 U/L 15  Total Protein 6.1 - 8.1 g/dL 7.5  Total Bilirubin 0.2 - 1.2 mg/dL 0.6    Latest Reference Range & Units 01/23/23 14:06  Alkaline phosphatase (APISO) 37 - 153 U/L 86  Vitamin D , 25-Hydroxy 30 - 100 ng/mL 38  Globulin 1.9 - 3.7 g/dL (calc) 3.6    Latest Reference Range & Units 01/23/23 14:06  PTH, Intact 16 - 77 pg/mL 59  TSH 0.40 - 4.50 mIU/L 0.49  T4,Free(Direct) 0.8 - 1.8 ng/dL 1.5  Albumin MSPROF 3.6 - 5.1 g/dL 3.9       DXA 04/05/863    Change 2020  AP Spine -2.0 Down 1.0%  RFN -3.5    RTH -2.1 Down 1.0%  LFN -2.7   LTH -2.1 Down 8%    ASSESSMENT/PLAN/RECOMMENDATIONS:   Osteoporosis :  - We again emphasized the importance of optimizing calcium  and vitamin D  intake - We again emphasized the importance of weightbearing exercises -She reports GI intolerance with Actonel  and Fosamax  -Prolia has been cost prohibitive and I  suspect Evenity will be in the same category - She did receive her first zoledronic  acid injection 01/2023 with no side effects - Will repeat next year  Medications : Calcium  1200 mg daily Vitamin D  2000 IU daily Zoledronic  acid 5 mg IV annually   I spent 25 minutes preparing to see the patient by review of recent labs, imaging and procedures, obtaining and reviewing separately obtained history, communicating with the patient/family or caregiver, ordering medications, tests or procedures, and documenting clinical information in the EHR including the differential Dx, treatment, and any further evaluation and other management    Follow-up in 6 months  Signed electronically by: Natale Bail, MD  [facility] End[facility]  [facility] [address] [place] [place], [place] [address] Phone: [phone] FAX: [phone]   CC: [place], Betty G, MD [address] [place] [address] Phone: [phone] Fax: [phone]   Return to Endocrinology clinic as below: Future Appointments  Date Time Provider Department Center  07/25/2023  1:00 PM Sumayyah Custodio, Julian Obey, MD [facility] None  09/10/2023  3:40 PM [facility]-ANNUAL WELLNESS VISIT [facility]  07/28/2024 10:55 AM Lillian Rein, MD [facility]

## 2023-07-27 ENCOUNTER — Telehealth: Payer: Self-pay

## 2023-07-27 MED ORDER — HYDRALAZINE HCL 25 MG PO TABS: ORAL_TABLET | ORAL | 0 refills | Status: AC

## 2023-07-27 NOTE — Telephone Encounter (Signed)
 I spoke with pt, Rx sent in. She is aware that it can cause drowsiness.

## 2023-07-27 NOTE — Addendum Note (Signed)
 Addended by: Doll French on: 07/27/2023 03:55 PM   Modules accepted: Orders

## 2023-07-27 NOTE — Telephone Encounter (Signed)
 Copied from CRM [id number]. Topic: Clinical - Medical Advice >> Jul 27, 2023 12:35 PM Adonis Hoot wrote: Reason for CRM: Patient will be taking a flight on Friday and returning on Sunday ,she would like to know if she could be prescribed anything to help with her nerves?  [id number] [place], [place] - [address]  Phone: [phone] Fax: [phone]

## 2023-07-27 NOTE — Telephone Encounter (Signed)
 If she is not driving there or when she gets here, she can take Hydroxyzine  25 mg 1/2-1 tab 10-15 min before flying. Medications causes drowsiness. Thanks, BJ

## 2023-08-05 ENCOUNTER — Other Ambulatory Visit: Payer: Self-pay | Admitting: Family Medicine

## 2023-08-05 DIAGNOSIS — K219 Gastro-esophageal reflux disease without esophagitis: Secondary | ICD-10-CM

## 2023-08-26 ENCOUNTER — Other Ambulatory Visit: Payer: Self-pay | Admitting: Family Medicine

## 2023-09-10 ENCOUNTER — Ambulatory Visit: Payer: Medicare Other

## 2023-09-10 VITALS — Ht 63.0 in | Wt 140.0 lb

## 2023-09-10 DIAGNOSIS — Z0000 Encounter for general adult medical examination without abnormal findings: Secondary | ICD-10-CM | POA: Diagnosis not present

## 2023-09-10 NOTE — Progress Notes (Signed)
 Subjective:   Nancy Chandler is a 68 y.o. who presents for a Medicare Wellness preventive visit.  As a reminder, Annual Wellness Visits don't include a physical exam, and some assessments may be limited, especially if this visit is performed virtually. We may recommend an in-person follow-up visit with your provider if needed.  Visit Complete: Virtual I connected with  Nancy Chandler on 09/10/23 by a audio enabled telemedicine application and verified that I am speaking with the correct person using two identifiers.  Patient Location: Home  Provider Location: Home Office  I discussed the limitations of evaluation and management by telemedicine. The patient expressed understanding and agreed to proceed.  Vital Signs: Because this visit was a virtual/telehealth visit, some criteria may be missing or patient reported. Any vitals not documented were not able to be obtained and vitals that have been documented are patient reported.    Persons Participating in Visit: Patient.  AWV Questionnaire: No: Patient Medicare AWV questionnaire was not completed prior to this visit.  Cardiac Risk Factors include: advanced age (>64men, >1 women);diabetes mellitus;hypertension     Objective:    Today's Vitals   09/10/23 1544  Weight: 140 lb (63.5 kg)  Height: 5' 3 (1.6 m)   Body mass index is 24.8 kg/m.     09/10/2023    3:49 PM 02/19/2023   11:21 AM 09/06/2022    3:48 PM 09/01/2021   11:07 AM 08/30/2020    8:35 AM 03/16/2016    2:45 PM 02/08/2016    4:40 PM  Advanced Directives  Does Patient Have a Medical Advance Directive? No No No No Yes No  Yes   Type of Agricultural consultant;Living will  Healthcare Power of Attorney  Copy of Healthcare Power of Attorney in Chart?     No - copy requested  No - copy requested   Would patient like information on creating a medical advance directive? No - Patient declined No - Patient declined No - Patient  declined No - Patient declined  Yes (Inpatient - patient requests chaplain consult to create a medical advance directive)       Data saved with a previous flowsheet row definition    Current Medications (verified) Outpatient Encounter Medications as of 09/10/2023  Medication Sig   amLODipine  (NORVASC ) 5 MG tablet Take 1 tablet (5 mg total) by mouth at bedtime.   aspirin  81 MG EC tablet    atorvastatin  (LIPITOR) 80 MG tablet TAKE 1 TABLET BY MOUTH EVERY DAY   clobetasol  ointment (TEMOVATE ) 0.05 % Apply bid for itching for up to 5 days.  Call office if itching is not relieved after that time.   EPINEPHrine  (EPIPEN  2-PAK) 0.3 mg/0.3 mL IJ SOAJ injection Inject 0.3 mg into the muscle as needed for anaphylaxis.   esomeprazole  (NEXIUM ) 40 MG capsule TAKE 1 CAPSULE (40 MG TOTAL) BY MOUTH DAILY.   fluticasone (FLONASE) 50 MCG/ACT nasal spray Place into the nose as needed.   hydrALAZINE  (APRESOLINE ) 25 MG tablet Take 1/2-1 tablet by mouth 10-15 minutes before flying. Medication can cause drowsiness.   hydrochlorothiazide  (MICROZIDE ) 12.5 MG capsule TAKE 1 CAPSULE BY MOUTH EVERY DAY   ibuprofen  (ADVIL ) 600 MG tablet Take 1 tablet (600 mg total) by mouth every 6 (six) hours as needed.   mometasone  (ELOCON ) 0.1 % ointment Apply to affected skin one to two times weekly   Multiple Vitamin (MULTIVITAMIN) tablet Take 1 tablet by mouth daily.  No facility-administered encounter medications on file as of 09/10/2023.    Allergies (verified) Bee venom, Actonel  [risedronate ], and Hydrocodone   History: Past Medical History:  Diagnosis Date   Abnormal Pap smear of cervix    neg HPV HR+ 16/18 neg 2017, 2018 LGSIL HPV-, 2019 ASCUS HPV HR+, 08-21-2018 LGSIL HPV HR-   Anxiety    Depression    Diabetes mellitus without complication (HCC)    Dyspareunia    History of colon polyps    Hyperlipidemia    Hypertension    Lichen sclerosus    Thyroid  disease    Past Surgical History:  Procedure Laterality Date    COLPOSCOPY  2010, 2018, 2019   History of LSIL, ASCUS + HPV   LYMPH NODE BIOPSY     TUBAL LIGATION  1990   Family History  Problem Relation Age of Onset   Hypertension Brother    Hearing loss Brother    Hyperlipidemia Brother    Cancer Mother        colon   Hypertension Father    Alcohol abuse Father    Diabetes Father    Heart disease Maternal Grandmother        CHF   Arthritis Maternal Grandmother    Cancer Maternal Grandfather        kidney   Heart disease Paternal Grandmother        CHF   Birth defects Daughter    Breast cancer Paternal Aunt    Heart disease Paternal Aunt        open heart surgery   Social History   Socioeconomic History   Marital status: Married    Spouse name: Not on file   Number of children: 2   Years of education: Not on file   Highest education level: Some college, no degree  Occupational History   Not on file  Tobacco Use   Smoking status: Never   Smokeless tobacco: Never  Vaping Use   Vaping status: Never Used  Substance and Sexual Activity   Alcohol use: No    Alcohol/week: 0.0 standard drinks of alcohol   Drug use: No   Sexual activity: Yes    Partners: Male    Birth control/protection: Surgical    Comment: Tubal ligation  Other Topics Concern   Not on file  Social History Narrative   Not on file   Social Drivers of Health   Financial Resource Strain: Low Risk  (09/10/2023)   Overall Financial Resource Strain (CARDIA)    Difficulty of Paying Living Expenses: Not hard at all  Food Insecurity: No Food Insecurity (09/10/2023)   Hunger Vital Sign    Worried About Running Out of Food in the Last Year: Never true    Ran Out of Food in the Last Year: Never true  Transportation Needs: No Transportation Needs (09/10/2023)   PRAPARE - Administrator, Civil Service (Medical): No    Lack of Transportation (Non-Medical): No  Physical Activity: Sufficiently Active (09/10/2023)   Exercise Vital Sign    Days of Exercise  per Week: 5 days    Minutes of Exercise per Session: 90 min  Stress: No Stress Concern Present (09/10/2023)   [employer] of Occupational Health - Occupational Stress Questionnaire    Feeling of Stress: Not at all  Social Connections: Socially Integrated (09/10/2023)   Social Connection and Isolation Panel    Frequency of Communication with Friends and Family: More than three times a week  Frequency of Social Gatherings with Friends and Family: More than three times a week    Attends Religious Services: More than 4 times per year    Active Member of [employer] or Organizations: Yes    Attends Engineer, structural: More than 4 times per year    Marital Status: Married    Tobacco Counseling Counseling given: Not Answered    Clinical Intake:  Pre-visit preparation completed: Yes  Pain : No/denies pain     BMI - recorded: 24.8 Nutritional Status: BMI of 19-24  Normal Nutritional Risks: None Diabetes: Yes CBG done?: No Did pt. bring in CBG monitor from home?: No  Lab Results  Component Value Date   HGBA1C 6.2 04/09/2023   HGBA1C 5.9 03/17/2022   HGBA1C 6.6 (H) 11/08/2021     How often do you need to have someone help you when you read instructions, pamphlets, or other written materials from your doctor or pharmacy?: 1 - Never  Interpreter Needed?: No  Information entered by :: Rojelio Blush LPN   Activities of Daily Living     09/10/2023    3:48 PM  In your present state of health, do you have any difficulty performing the following activities:  Hearing? 0  Vision? 0  Difficulty concentrating or making decisions? 0  Walking or climbing stairs? 0  Dressing or bathing? 0  Doing errands, shopping? 0  Preparing Food and eating ? N  Using the Toilet? N  In the past six months, have you accidently leaked urine? N  Do you have problems with loss of bowel control? N  Managing your Medications? N  Managing your Finances? N  Housekeeping or managing your  Housekeeping? N    Patient Care Team: [place], Betty G, MD as PCP - General (Family Medicine) Lonni Slain, MD as PCP - Cardiology (Cardiology)  I have updated your Care Teams any recent Medical Services you may have received from other providers in the past year.     Assessment:   This is a routine wellness examination for Nancy Chandler.  Hearing/Vision screen Hearing Screening - Comments:: Denies hearing difficulties   Vision Screening - Comments:: Wears rx glasses - up to date with routine eye exams with  [facility]   Goals Addressed               This Visit's Progress     Increase physical activity (pt-stated)        Lose weight.       Depression Screen     09/10/2023    3:48 PM 07/12/2023    1:14 PM 02/19/2023   11:22 AM 09/06/2022    3:46 PM 07/06/2022    8:12 AM 05/16/2022    7:08 AM 03/17/2022    8:04 AM  PHQ 2/9 Scores  PHQ - 2 Score 0 0 0 0 0 0 0  PHQ- 9 Score      1 1    Fall Risk     09/10/2023    3:49 PM 07/12/2023    1:14 PM 02/19/2023   11:22 AM 09/06/2022    3:47 PM 09/03/2022    6:41 PM  Fall Risk   Falls in the past year? 0 0 0 0 0  Number falls in past yr: 0 0 0 0   Injury with Fall? 0 0  0 0  Risk for fall due to : No Fall Risks No Fall Risks No Fall Risks No Fall Risks   Follow up Falls  evaluation completed Falls evaluation completed Falls evaluation completed Falls prevention discussed     MEDICARE RISK AT HOME:  Medicare Risk at Home Any stairs in or around the home?: No If so, are there any without handrails?: No Home free of loose throw rugs in walkways, pet beds, electrical cords, etc?: Yes Adequate lighting in your home to reduce risk of falls?: Yes Life alert?: No Use of a cane, walker or w/c?: No Grab bars in the bathroom?: No Shower chair or bench in shower?: No Elevated toilet seat or a handicapped toilet?: Yes  TIMED UP AND GO:  Was the test performed?  No  Cognitive Function: 6CIT completed         09/10/2023    3:49 PM 09/06/2022    3:49 PM 09/01/2021   11:07 AM  6CIT Screen  What Year? 0 points 0 points 0 points  What month? 0 points 0 points 0 points  What time? 0 points 0 points 0 points  Count back from 20 0 points 0 points 0 points  Months in reverse 0 points 0 points 0 points  Repeat phrase 0 points 0 points 0 points  Total Score 0 points 0 points 0 points    Immunizations Immunization History  Administered Date(s) Administered   Fluad Quad(high Dose 65+) 10/27/2020, 11/08/2021   Influenza, High Dose Seasonal PF 12/22/2022   Influenza,inj,Quad PF,6+ Mos 03/14/2017, 02/25/2018, 12/05/2018   Influenza-Unspecified 12/04/2013, 11/21/2015   Moderna Sars-Covid-2 Vaccination 04/12/2019, 05/13/2019, 12/30/2019, 07/27/2020   PFIZER Comirnaty(Gray Top)Covid-19 Tri-Sucrose Vaccine 11/29/2021   PNEUMOCOCCAL CONJUGATE-20 03/17/2022   Pfizer(Comirnaty)Fall Seasonal Vaccine 12 years and older 12/22/2022   Pneumococcal Polysaccharide-23 03/16/2016   Respiratory Syncytial Virus Vaccine,Recomb Aduvanted(Arexvy) 12/15/2021   Tdap 12/08/2013   Zoster Recombinant(Shingrix ) 10/20/2019, 07/27/2020    Screening Tests Health Maintenance  Topic Date Due   COVID-19 Vaccine (7 - Moderna risk 2024-25 season) 06/21/2023   INFLUENZA VACCINE  09/21/2023   HEMOGLOBIN A1C  10/07/2023   DTaP/Tdap/Td (2 - Td or Tdap) 12/09/2023   Diabetic kidney evaluation - eGFR measurement  01/23/2024   OPHTHALMOLOGY EXAM  02/28/2024   MAMMOGRAM  03/18/2024   FOOT EXAM  04/08/2024   Diabetic kidney evaluation - Urine ACR  05/20/2024   Medicare Annual Wellness (AWV)  09/09/2024   Colonoscopy  05/10/2028   Pneumococcal Vaccine: 50+ Years  Completed   DEXA SCAN  Completed   Hepatitis C Screening  Completed   Zoster Vaccines- Shingrix   Completed   Hepatitis B Vaccines  Aged Out   HPV VACCINES  Aged Out   Meningococcal B Vaccine  Aged Out    Health Maintenance  Health Maintenance Due  Topic Date Due    COVID-19 Vaccine (7 - Moderna risk 2024-25 season) 06/21/2023   Health Maintenance Items Addressed:   Additional Screening:  Vision Screening: Recommended annual ophthalmology exams for early detection of glaucoma and other disorders of the eye. Would you like a referral to an eye doctor? No    Dental Screening: Recommended annual dental exams for proper oral hygiene  Community Resource Referral / Chronic Care Management: CRR required this visit?  No   CCM required this visit?  No   Plan:    I have personally reviewed and noted the following in the patient's chart:   Medical and social history Use of alcohol, tobacco or illicit drugs  Current medications and supplements including opioid prescriptions. Patient is not currently taking opioid prescriptions. Functional ability and status Nutritional status Physical activity Advanced directives  List of other physicians Hospitalizations, surgeries, and ER visits in previous 12 months Vitals Screenings to include cognitive, depression, and falls Referrals and appointments  In addition, I have reviewed and discussed with patient certain preventive protocols, quality metrics, and best practice recommendations. A written personalized care plan for preventive services as well as general preventive health recommendations were provided to patient.   Rojelio LELON Blush, LPN   2/78/7974   After Visit Summary: (MyChart) Due to this being a telephonic visit, the after visit summary with patients personalized plan was offered to patient via MyChart   Notes: Nothing significant to report at this time.

## 2023-09-10 NOTE — Patient Instructions (Addendum)
 Ms. Nancy Chandler , Thank you for taking time out of your busy schedule to complete your Annual Wellness Visit with me. I enjoyed our conversation and look forward to speaking with you again next year. I, as well as your care team,  appreciate your ongoing commitment to your health goals. Please review the following plan we discussed and let me know if I can assist you in the future. Your Game plan/ To Do List    Referrals: If you haven't heard from the office you've been referred to, please reach out to them at the phone provided.   Follow up Visits: Next Medicare AWV with our clinical staff: 09/15/24 @ 3:40p   Have you seen your provider in the last 6 months (3 months if uncontrolled diabetes)? 05/21/23 Next Office Visit with your provider:   Clinician Recommendations:  Aim for 30 minutes of exercise or brisk walking, 6-8 glasses of water, and 5 servings of fruits and vegetables each day.       This is a list of the screening recommended for you and due dates:  Health Maintenance  Topic Date Due   COVID-19 Vaccine (7 - Moderna risk 2024-25 season) 06/21/2023   Flu Shot  09/21/2023   Hemoglobin A1C  10/07/2023   DTaP/Tdap/Td vaccine (2 - Td or Tdap) 12/09/2023   Yearly kidney function blood test for diabetes  01/23/2024   Eye exam for diabetics  02/28/2024   Mammogram  03/18/2024   Complete foot exam   04/08/2024   Yearly kidney health urinalysis for diabetes  05/20/2024   Medicare Annual Wellness Visit  09/09/2024   Colon Cancer Screening  05/10/2028   Pneumococcal Vaccine for age over 56  Completed   DEXA scan (bone density measurement)  Completed   Hepatitis C Screening  Completed   Zoster (Shingles) Vaccine  Completed   Hepatitis B Vaccine  Aged Out   HPV Vaccine  Aged Out   Meningitis B Vaccine  Aged Out    Advanced directives: (Declined) Advance directive discussed with you today. Even though you declined this today, please call our office should you change your mind, and we can  give you the proper paperwork for you to fill out. Advance Care Planning is important because it:  [x]  Makes sure you receive the medical care that is consistent with your values, goals, and preferences  [x]  It provides guidance to your family and loved ones and reduces their decisional burden about whether or not they are making the right decisions based on your wishes.  Follow the link provided in your after visit summary or read over the paperwork we have mailed to you to help you started getting your Advance Directives in place. If you need assistance in completing these, please reach out to us  so that we can help you!  See attachments for Preventive Care and Fall Prevention Tips.

## 2023-09-25 DIAGNOSIS — E782 Mixed hyperlipidemia: Secondary | ICD-10-CM | POA: Diagnosis not present

## 2023-09-25 DIAGNOSIS — Z91030 Bee allergy status: Secondary | ICD-10-CM | POA: Diagnosis not present

## 2023-09-25 DIAGNOSIS — R7303 Prediabetes: Secondary | ICD-10-CM | POA: Diagnosis not present

## 2023-09-25 DIAGNOSIS — Z79899 Other long term (current) drug therapy: Secondary | ICD-10-CM | POA: Diagnosis not present

## 2023-09-25 DIAGNOSIS — M810 Age-related osteoporosis without current pathological fracture: Secondary | ICD-10-CM | POA: Diagnosis not present

## 2023-09-25 DIAGNOSIS — I10 Essential (primary) hypertension: Secondary | ICD-10-CM | POA: Diagnosis not present

## 2023-09-25 DIAGNOSIS — J3089 Other allergic rhinitis: Secondary | ICD-10-CM | POA: Diagnosis not present

## 2023-09-25 DIAGNOSIS — K219 Gastro-esophageal reflux disease without esophagitis: Secondary | ICD-10-CM | POA: Diagnosis not present

## 2023-09-25 DIAGNOSIS — Z1159 Encounter for screening for other viral diseases: Secondary | ICD-10-CM | POA: Diagnosis not present

## 2023-09-25 DIAGNOSIS — E559 Vitamin D deficiency, unspecified: Secondary | ICD-10-CM | POA: Diagnosis not present

## 2023-09-28 ENCOUNTER — Other Ambulatory Visit: Payer: Self-pay | Admitting: Family Medicine

## 2023-09-28 DIAGNOSIS — I10 Essential (primary) hypertension: Secondary | ICD-10-CM

## 2024-02-25 ENCOUNTER — Encounter: Payer: Self-pay | Admitting: Internal Medicine

## 2024-02-25 ENCOUNTER — Ambulatory Visit: Admitting: Internal Medicine

## 2024-02-25 ENCOUNTER — Other Ambulatory Visit (HOSPITAL_COMMUNITY): Payer: Self-pay | Admitting: Internal Medicine

## 2024-02-25 VITALS — BP 160/80 | Ht 63.0 in | Wt 137.0 lb

## 2024-02-25 DIAGNOSIS — M810 Age-related osteoporosis without current pathological fracture: Secondary | ICD-10-CM

## 2024-02-25 NOTE — Progress Notes (Signed)
 "   Name: Tymika Grilli  MRN/ DOB: [id number], October 16, 1955    Age/ Sex: 69 y.o., female    PCP: Jordan, Betty G, MD   Reason for Endocrinology Evaluation: Osteoporosis      Date of Initial Endocrinology Evaluation: 01/23/2023    HPI: Ms. Shaina Gullatt is a 69 y.o. female with a past medical history of HTN, GERD, PAD. The patient presented for initial endocrinology clinic visit on 01/23/2023  for consultative assistance with her Osteoporosis .   Pt was diagnosed with osteoporosis: in 2012 with a T score of 2.5 at the femoral neck  Menarche at age : 34 Menopausal at age : 58 Fracture Hx: no Hx of HRT: no FH of osteoporosis or hip fracture: Maternal grandmother  Prior Hx of anti-resorptive therapy : Actonel -diarrhea/aching . Fosamax - stomach upset.  Her PCP attempted to prescribe Prolia but the co-pay was $315 which would cause hardship on the patient.  On her initial visit to our clinic she had a normal GFR, LFTs, serum calcium , TFTs, vitamin D , phosphorus and PTH  The pt received her first zoledronic  acid injection 02/19/2023   SUBJECTIVE:    Today (02/25/2024):  Irasema Samaiya Awadallah is here for follow-up on osteoporosis.  She is not on calcium  tablet but she consumes it through food . She had to decrease dairy intake due to high cholesterol  No recent heartburn , on antacids daily  Has occasional constipation but no diarrhea  No recent falls No weight bearing exercise   She is on Vitamin D3 2000 international unit daily    HISTORY:  Past Medical History:  Past Medical History:  Diagnosis Date   Abnormal Pap smear of cervix    neg HPV HR+ 16/18 neg 2017, 2018 LGSIL HPV-, 2019 ASCUS HPV HR+, 08-21-2018 LGSIL HPV HR-   Anxiety    Depression    Diabetes mellitus without complication (HCC)    Dyspareunia    History of colon polyps    Hyperlipidemia    Hypertension    Lichen sclerosus    Thyroid  disease    Past Surgical History:  Past Surgical History:   Procedure Laterality Date   COLPOSCOPY  2010, 2018, 2019   History of LSIL, ASCUS + HPV   LYMPH NODE BIOPSY     TUBAL LIGATION  1990    Social History:  reports that she has never smoked. She has never used smokeless tobacco. She reports that she does not drink alcohol and does not use drugs. Family History: family history includes Alcohol abuse in her father; Arthritis in her maternal grandmother; Birth defects in her daughter; Breast cancer in her paternal aunt; Cancer in her maternal grandfather and mother; Diabetes in her father; Hearing loss in her brother; Heart disease in her maternal grandmother, paternal aunt, and paternal grandmother; Hyperlipidemia in her brother; Hypertension in her brother and father.   HOME MEDICATIONS: Allergies as of 02/25/2024       Reactions   Bee Venom Anaphylaxis   Carries Epi pen    Actonel  [risedronate ] Diarrhea, Other (See Comments)   Hydrocodone Nausea And Vomiting        Medication List        Accurate as of February 25, 2024  7:43 AM. If you have any questions, ask your nurse or doctor.          amLODipine  5 MG tablet Commonly known as: NORVASC  TAKE 1 TABLET BY MOUTH EVERYDAY AT BEDTIME   aspirin  EC 81 MG tablet  atorvastatin  80 MG tablet Commonly known as: LIPITOR TAKE 1 TABLET BY MOUTH EVERY DAY   clobetasol  ointment 0.05 % Commonly known as: TEMOVATE  Apply bid for itching for up to 5 days.  Call office if itching is not relieved after that time.   EPINEPHrine  0.3 mg/0.3 mL Soaj injection Commonly known as: EpiPen  2-Pak Inject 0.3 mg into the muscle as needed for anaphylaxis.   esomeprazole  40 MG capsule Commonly known as: NEXIUM  TAKE 1 CAPSULE (40 MG TOTAL) BY MOUTH DAILY.   fluticasone 50 MCG/ACT nasal spray Commonly known as: FLONASE Place into the nose as needed.   hydrALAZINE  25 MG tablet Commonly known as: APRESOLINE  Take 1/2-1 tablet by mouth 10-15 minutes before flying. Medication can cause drowsiness.    hydrochlorothiazide  12.5 MG capsule Commonly known as: MICROZIDE  TAKE 1 CAPSULE BY MOUTH EVERY DAY   ibuprofen  600 MG tablet Commonly known as: ADVIL  Take 1 tablet (600 mg total) by mouth every 6 (six) hours as needed.   mometasone  0.1 % ointment Commonly known as: ELOCON  Apply to affected skin one to two times weekly   multivitamin tablet Take 1 tablet by mouth daily.          REVIEW OF SYSTEMS: A comprehensive ROS was conducted with the patient and is negative except as per HPI    OBJECTIVE:  VS: LMP 02/21/2008    Wt Readings from Last 3 Encounters:  09/10/23 140 lb (63.5 kg)  07/25/23 145 lb (65.8 kg)  07/12/23 141 lb (64 kg)     EXAM: General: Pt appears well and is in NAD  Neck: General: Supple without adenopathy. Thyroid : Thyroid  size normal.  No goiter or nodules appreciated.   Lungs: Clear with good BS bilat   Heart: Auscultation: RRR.  Abdomen: Soft, nontender  Extremities:  BL LE: No pretibial edema   Mental Status: Judgment, insight: Intact Orientation: Oriented to time, place, and person Mood and affect: No depression, anxiety, or agitation     DATA REVIEWED:   Labs through PCP   12/26/2023 Vtd 33.6 TSH 0.692  09/25/2023 Gluc 87 BUN 10 Cr. 0.76 GFR 85    DXA 03/08/2022    Change 2020  AP Spine -2.0 Down 1.0%  RFN -3.5   RTH -2.1 Down 1.0%  LFN -2.7   LTH -2.1 Down 8%    ASSESSMENT/PLAN/RECOMMENDATIONS:   Osteoporosis :   -The patient did receive her first zoledronic  acid injection on 02/19/2024 -Unfortunately, she had to decrease dairy intake due to elevated cholesterol, which has resulted in low calcium  intake.  I did advise the patient to start over-the-counter calcium  tablet once daily at 600 mg -She had recent labs done through PCPs office with normal vitamin D .  I did advise the patient that she could take vitamin D  mixed with calcium  for ease of taking -She has tolerated zoledronic  acid in the past, we will proceed  with her second injection -I again emphasized the importance of optimizing calcium , and vitamin D  intake as well as weightbearing exercises -She reports GI intolerance with Actonel  and Fosamax  -Prolia has been cost prohibitive and I suspect Evenity will be in the same category   Medications : Calcium  1200 mg daily (combination of tablet and food Vitamin D  2000 IU daily Zoledronic  acid 5 mg IV annually    Follow-up in 1 yr   I spent 25 minutes preparing to see the patient by review of recent labs, imaging and procedures, obtaining and reviewing separately obtained history, communicating with the patient/family  or caregiver, ordering medications, tests or procedures, and documenting clinical information in the EHR including the differential Dx, treatment, and any further evaluation and other management    Signed electronically by: Stefano Redgie Butts, MD  [facility]  [facility] [address] [place], [place] [address] Phone: [phone] FAX: [phone]   CC: Jordan, Betty G, MD [address] [place] [address] Phone: [phone] Fax: [phone]   Return to Endocrinology clinic as below: Future Appointments  Date Time Provider Department Center  02/25/2024  1:00 PM Anaiyah Anglemyer, Donell Redgie, MD [facility] None  07/28/2024 10:55 AM Cleotilde Ronal RAMAN, MD [facility] [id number]  09/15/2024  3:40 PM [facility]NUAL WELLNESS VISIT [facility] [address]         "

## 2024-02-25 NOTE — Patient Instructions (Signed)
 Please start Calcium  with vitamin D3 at 600 mg daily  Please incorporate weight bearing exercises 20 minutes each at 3 times weekly

## 2024-02-27 ENCOUNTER — Telehealth: Payer: Self-pay | Admitting: Pharmacy Technician

## 2024-02-27 NOTE — Telephone Encounter (Signed)
 Auth Submission: APPROVED Site of care: Site of care: [facility] Payer: DEVOTED Medication & CPT/J Code(s) submitted: Reclast  (Zolendronic acid) J3489 Diagnosis Code: M81.0 Route of submission (phone, fax, portal):  Phone  Fax # Auth type: Buy/Bill PB Units/visits requested: X1 DOSE Reference number: [id number] Approval from: 02/27/24 to 02/19/25

## 2024-03-13 ENCOUNTER — Ambulatory Visit

## 2024-03-13 VITALS — BP 136/64 | HR 74 | Temp 97.7°F | Resp 16 | Ht 63.0 in | Wt 139.6 lb

## 2024-03-13 DIAGNOSIS — M816 Localized osteoporosis [Lequesne]: Secondary | ICD-10-CM

## 2024-03-13 MED ORDER — DIPHENHYDRAMINE HCL 25 MG PO CAPS
25.0000 mg | ORAL_CAPSULE | Freq: Once | ORAL | Status: AC
  Administered 2024-03-13: 25 mg via ORAL
  Filled 2024-03-13: qty 1

## 2024-03-13 MED ORDER — SODIUM CHLORIDE 0.9 % IV SOLN: INTRAVENOUS | Status: DC

## 2024-03-13 MED ORDER — ACETAMINOPHEN 325 MG PO TABS
650.0000 mg | ORAL_TABLET | Freq: Once | ORAL | Status: AC
  Administered 2024-03-13: 650 mg via ORAL
  Filled 2024-03-13: qty 2

## 2024-03-13 MED ORDER — ZOLEDRONIC ACID 5 MG/100ML IV SOLN
5.0000 mg | Freq: Once | INTRAVENOUS | Status: AC
  Administered 2024-03-13: 5 mg via INTRAVENOUS
  Filled 2024-03-13: qty 100

## 2024-03-13 NOTE — Progress Notes (Signed)
 Diagnosis: Osteoporosis  Provider:  Mannam, Praveen MD  Procedure: IV Infusion  IV Type: Peripheral, IV Location: L Antecubital  Reclast  (Zolendronic Acid), Dose: 5 mg  Infusion Start Time: 1028  Infusion Stop Time: 1057  Post Infusion IV Care: Observation period completed and Peripheral IV Discontinued  Discharge: Condition: Good, Destination: Home . AVS Declined  Performed by:  Rocky FORBES Search, RN

## 2024-03-26 ENCOUNTER — Other Ambulatory Visit: Payer: Self-pay | Admitting: Family Medicine

## 2024-03-26 DIAGNOSIS — I10 Essential (primary) hypertension: Secondary | ICD-10-CM

## 2024-07-28 ENCOUNTER — Ambulatory Visit (HOSPITAL_BASED_OUTPATIENT_CLINIC_OR_DEPARTMENT_OTHER): Admitting: Obstetrics & Gynecology

## 2024-09-15 ENCOUNTER — Ambulatory Visit

## 2025-02-24 ENCOUNTER — Ambulatory Visit: Admitting: Internal Medicine
